# Patient Record
Sex: Female | Born: 1940 | Race: White | Hispanic: No | Smoking: Former smoker
Health system: Southern US, Community
[De-identification: ages and names within clinical notes are randomized; demographics above are authoritative.]

## PROBLEM LIST (undated history)

## (undated) DIAGNOSIS — E039 Hypothyroidism, unspecified: Secondary | ICD-10-CM

## (undated) DIAGNOSIS — K219 Gastro-esophageal reflux disease without esophagitis: Secondary | ICD-10-CM

## (undated) DIAGNOSIS — M1990 Unspecified osteoarthritis, unspecified site: Secondary | ICD-10-CM

## (undated) DIAGNOSIS — A0472 Enterocolitis due to Clostridium difficile, not specified as recurrent: Secondary | ICD-10-CM

## (undated) DIAGNOSIS — M810 Age-related osteoporosis without current pathological fracture: Secondary | ICD-10-CM

## (undated) DIAGNOSIS — K529 Noninfective gastroenteritis and colitis, unspecified: Secondary | ICD-10-CM

## (undated) DIAGNOSIS — J449 Chronic obstructive pulmonary disease, unspecified: Secondary | ICD-10-CM

## (undated) DIAGNOSIS — K5100 Ulcerative (chronic) pancolitis without complications: Secondary | ICD-10-CM

## (undated) DIAGNOSIS — N200 Calculus of kidney: Secondary | ICD-10-CM

## (undated) DIAGNOSIS — E042 Nontoxic multinodular goiter: Secondary | ICD-10-CM

## (undated) DIAGNOSIS — I10 Essential (primary) hypertension: Secondary | ICD-10-CM

## (undated) DIAGNOSIS — K5190 Ulcerative colitis, unspecified, without complications: Secondary | ICD-10-CM

## (undated) DIAGNOSIS — E041 Nontoxic single thyroid nodule: Secondary | ICD-10-CM

## (undated) HISTORY — DX: Essential (primary) hypertension: I10

## (undated) HISTORY — PX: OTHER SURGICAL HISTORY: SHX169

## (undated) HISTORY — DX: Chronic obstructive pulmonary disease, unspecified: J44.9

## (undated) HISTORY — PX: JOINT REPLACEMENT: SHX530

## (undated) HISTORY — DX: Hypothyroidism, unspecified: E03.9

## (undated) HISTORY — DX: Calculus of kidney: N20.0

## (undated) HISTORY — PX: ANKLE SURGERY: SHX546

## (undated) HISTORY — PX: ABDOMINAL HYSTERECTOMY: SHX81

## (undated) HISTORY — PX: BACK SURGERY: SHX140

## (undated) HISTORY — DX: Nontoxic multinodular goiter: E04.2

---

## 2004-12-06 ENCOUNTER — Ambulatory Visit: Payer: Self-pay | Admitting: Internal Medicine

## 2005-06-12 ENCOUNTER — Ambulatory Visit: Payer: Self-pay | Admitting: Internal Medicine

## 2005-07-14 ENCOUNTER — Ambulatory Visit (HOSPITAL_COMMUNITY): Admission: RE | Admit: 2005-07-14 | Discharge: 2005-07-15 | Payer: Self-pay | Admitting: Neurological Surgery

## 2005-11-13 ENCOUNTER — Ambulatory Visit: Payer: Self-pay | Admitting: Internal Medicine

## 2005-11-15 ENCOUNTER — Ambulatory Visit: Payer: Self-pay | Admitting: Internal Medicine

## 2006-03-09 ENCOUNTER — Ambulatory Visit: Payer: Self-pay | Admitting: Gastroenterology

## 2007-01-17 ENCOUNTER — Ambulatory Visit: Payer: Self-pay | Admitting: Internal Medicine

## 2008-02-05 ENCOUNTER — Ambulatory Visit: Payer: Self-pay | Admitting: Internal Medicine

## 2008-07-27 ENCOUNTER — Ambulatory Visit: Payer: Self-pay | Admitting: General Practice

## 2008-07-27 ENCOUNTER — Ambulatory Visit: Payer: Self-pay | Admitting: Cardiology

## 2009-02-25 ENCOUNTER — Ambulatory Visit: Payer: Self-pay | Admitting: Internal Medicine

## 2010-04-14 ENCOUNTER — Ambulatory Visit: Payer: Self-pay | Admitting: Internal Medicine

## 2011-06-06 ENCOUNTER — Ambulatory Visit: Payer: Self-pay | Admitting: Internal Medicine

## 2011-08-04 ENCOUNTER — Inpatient Hospital Stay: Payer: Self-pay | Admitting: Family Medicine

## 2011-08-04 LAB — COMPREHENSIVE METABOLIC PANEL
Albumin: 3.6 g/dL (ref 3.4–5.0)
Alkaline Phosphatase: 71 U/L (ref 50–136)
Bilirubin,Total: 0.6 mg/dL (ref 0.2–1.0)
Calcium, Total: 9 mg/dL (ref 8.5–10.1)
Chloride: 103 mmol/L (ref 98–107)
Co2: 26 mmol/L (ref 21–32)
Glucose: 123 mg/dL — ABNORMAL HIGH (ref 65–99)
Sodium: 140 mmol/L (ref 136–145)
Total Protein: 7.1 g/dL (ref 6.4–8.2)

## 2011-08-04 LAB — URINALYSIS, COMPLETE
Glucose,UR: NEGATIVE mg/dL (ref 0–75)
Ketone: NEGATIVE
Nitrite: NEGATIVE
Ph: 5 (ref 4.5–8.0)
Specific Gravity: 1.024 (ref 1.003–1.030)

## 2011-08-04 LAB — CBC WITH DIFFERENTIAL/PLATELET
Eosinophil #: 0 10*3/uL (ref 0.0–0.7)
Eosinophil %: 0.2 %
HCT: 44 % (ref 35.0–47.0)
Lymphocyte #: 1.1 10*3/uL (ref 1.0–3.6)
MCH: 29.8 pg (ref 26.0–34.0)
MCHC: 32.6 g/dL (ref 32.0–36.0)
MCV: 91 fL (ref 80–100)
Monocyte #: 0.5 x10 3/mm (ref 0.2–0.9)
Neutrophil #: 10.3 10*3/uL — ABNORMAL HIGH (ref 1.4–6.5)
Neutrophil %: 85.9 %
Platelet: 175 10*3/uL (ref 150–440)
RBC: 4.82 10*6/uL (ref 3.80–5.20)
RDW: 13.7 % (ref 11.5–14.5)
WBC: 11.9 10*3/uL — ABNORMAL HIGH (ref 3.6–11.0)

## 2011-08-05 LAB — BASIC METABOLIC PANEL
Anion Gap: 10 (ref 7–16)
BUN: 23 mg/dL — ABNORMAL HIGH (ref 7–18)
Calcium, Total: 8.4 mg/dL — ABNORMAL LOW (ref 8.5–10.1)
Co2: 25 mmol/L (ref 21–32)
Glucose: 110 mg/dL — ABNORMAL HIGH (ref 65–99)

## 2011-08-05 LAB — CBC WITH DIFFERENTIAL/PLATELET
Basophil #: 0 10*3/uL (ref 0.0–0.1)
MCH: 29.9 pg (ref 26.0–34.0)
MCHC: 32.7 g/dL (ref 32.0–36.0)
MCV: 91 fL (ref 80–100)
Monocyte %: 0.6 %
Neutrophil %: 94.3 %
Platelet: 121 10*3/uL — ABNORMAL LOW (ref 150–440)
RDW: 13.5 % (ref 11.5–14.5)

## 2011-08-08 ENCOUNTER — Ambulatory Visit: Payer: Self-pay | Admitting: Urology

## 2011-08-10 ENCOUNTER — Ambulatory Visit: Payer: Self-pay | Admitting: Urology

## 2011-08-10 LAB — CULTURE, BLOOD (SINGLE)

## 2011-08-16 ENCOUNTER — Encounter: Payer: Self-pay | Admitting: Cardiovascular Disease

## 2011-08-21 ENCOUNTER — Ambulatory Visit (INDEPENDENT_AMBULATORY_CARE_PROVIDER_SITE_OTHER): Payer: Medicare Other | Admitting: Cardiovascular Disease

## 2011-08-21 ENCOUNTER — Encounter: Payer: Self-pay | Admitting: Cardiovascular Disease

## 2011-08-21 VITALS — BP 132/86 | HR 69 | Ht 62.0 in | Wt 118.0 lb

## 2011-08-21 DIAGNOSIS — R0602 Shortness of breath: Secondary | ICD-10-CM

## 2011-08-21 DIAGNOSIS — I4949 Other premature depolarization: Secondary | ICD-10-CM

## 2011-08-21 DIAGNOSIS — I493 Ventricular premature depolarization: Secondary | ICD-10-CM | POA: Insufficient documentation

## 2011-08-21 DIAGNOSIS — R079 Chest pain, unspecified: Secondary | ICD-10-CM

## 2011-08-21 DIAGNOSIS — R0789 Other chest pain: Secondary | ICD-10-CM

## 2011-08-21 NOTE — Progress Notes (Signed)
HPI  This is a 71 year old female who was referred by Dr. Jacqlyn Larsen for evaluation of frequent PVCs noted during a recent lithotripsy. The patient reports no previous cardiac history. She was hospitalized this month at [facility] with COPD/asthma exacerbation as well as ureterolithiasis with obstructive uropathy and urinary tract infection. She was treated successfully and discharged home. She underwent an outpatient lithotripsy by Dr. Jacqlyn Larsen. Since then, she has not had any further discomfort. During her lithotripsy, she was noted to have frequent PVCs. The ECG at that time showed prolonged QT interval and left atrial enlargement. She did have palpitations at that time and was nervous. She denies any previous syncope. She occasionally feels palpitations at home but no prolonged episodes of tachycardia. She complains of dyspnea with minimal activities and occasional chest discomfort which she has always associated with her lung disease. She has not had any recent cardiac evaluation. Her functional capacity is limited due to severe osteoarthritis of the left hip. She's actually contemplating having left hip replacement.  Allergies  Allergen Reactions  . Codeine Nausea Only    "dizzy"  . Ibuprofen Hives and Swelling  . Morphine And Related     "Makes me see bugs."     Current Outpatient Prescriptions on File Prior to Visit  Medication Sig Dispense Refill  . Fluticasone-Salmeterol (ADVAIR) 250-50 MCG/DOSE AEPB Inhale 1 puff into the lungs 2 (two) times daily.      . Ipratropium-Albuterol (COMBIVENT IN) Inhale into the lungs 2 (two) times daily.      . Levothyroxine Sodium (SYNTHROID PO) Take 25 mg by mouth daily.      . ranitidine (ZANTAC) 75 MG tablet Take 75 mg by mouth as needed.         Past Medical History  Diagnosis Date  . Asthma   . Hypothyroidism   . Nephrolithiasis     spontaneously passed  . Multiple thyroid nodules     post excision     Past Surgical History  Procedure Date  .  Abdominal hysterectomy   . Ankle surgery     left fracture  . Back surgery     herniated disk  . Excision of thyroid nodule      Family History  Problem Relation Age of Onset  . Heart disease Mother     died at 69     History   Social History  . Marital Status: Married    Spouse Name: N/A    Number of Children: N/A  . Years of Education: N/A   Occupational History  . Not on file.   Social History Main Topics  . Smoking status: Current Everyday Smoker -- 0.5 packs/day  . Smokeless tobacco: Not on file  . Alcohol Use: No  . Drug Use: No  . Sexually Active:    Other Topics Concern  . Not on file   Social History Narrative  . No narrative on file     ROS Constitutional: Negative for fever, chills, diaphoresis, activity change, appetite change and fatigue.  HENT: Negative for hearing loss, nosebleeds, congestion, sore throat, facial swelling, drooling, trouble swallowing, neck pain, voice change, sinus pressure and tinnitus.  Eyes: Negative for photophobia, pain, discharge and visual disturbance.  Respiratory: Negative for apnea. Positive for cough, chest tightness, shortness of breath and wheezing.  Cardiovascular: Negative for  leg swelling.  Gastrointestinal: Negative for nausea, vomiting, abdominal pain, diarrhea, constipation, blood in stool and abdominal distention.  Genitourinary: Negative for dysuria, urgency, frequency, hematuria and  decreased urine volume.  Musculoskeletal: Negative for myalgias, back pain, joint swelling. Positive for arthralgias and gait problem.  Skin: Negative for color change, pallor, rash and wound.  Neurological: Negative for dizziness, tremors, seizures, syncope, speech difficulty, weakness, light-headedness, numbness and headaches.  Psychiatric/Behavioral: Negative for suicidal ideas, hallucinations, behavioral problems and agitation. The patient is not nervous/anxious.     PHYSICAL EXAM   BP 132/86  Pulse 69  Ht 5' 2"   (1.575 m)  Wt 118 lb (53.524 kg)  BMI 21.58 kg/m2 Constitutional: She is oriented to person, place, and time. She appears well-developed and well-nourished. No distress.  HENT: No nasal discharge.  Head: Normocephalic and atraumatic.  Eyes: Pupils are equal and round. Right eye exhibits no discharge. Left eye exhibits no discharge.  Neck: Normal range of motion. Neck supple. No JVD present. No thyromegaly present.  Cardiovascular: Normal rate, regular rhythm, normal heart sounds. Exam reveals no gallop and no friction rub. No murmur heard.  Pulmonary/Chest: Effort normal and diminished breath sounds. No stridor. No respiratory distress. She has no wheezes. She has no rales. She exhibits no tenderness.  Abdominal: Soft. Bowel sounds are normal. She exhibits no distension. There is no tenderness. There is no rebound and no guarding.  Musculoskeletal: Normal range of motion. She exhibits no edema and no tenderness.  Neurological: She is alert and oriented to person, place, and time. Coordination normal.  Skin: Skin is warm and dry. No rash noted. She is not diaphoretic. No erythema. No pallor.  Psychiatric: She has a normal mood and affect. Her behavior is normal. Judgment and thought content normal.     EKG: Normal sinus rhythm with no significant ST or T wave changes. Normal QT interval no premature beats.   ASSESSMENT AND PLAN

## 2011-08-21 NOTE — Patient Instructions (Signed)
Your physician has requested that you have a lexiscan myoview. For further information please visit [email or web address]. Please follow instruction sheet, as given.  Your physician has requested that you have an echocardiogram. Echocardiography is a painless test that uses sound waves to create images of your heart. It provides your doctor with information about the size and shape of your heart and how well your heart's chambers and valves are working. This procedure takes approximately one hour. There are no restrictions for this procedure.  Follow up after tests.

## 2011-08-21 NOTE — Assessment & Plan Note (Signed)
It is possible that this was due to anxiety and acute illness from other reasons. It seems that this has resolved at this time. Her EKG is now normal. Ischemic and structural heart disease we'll need to be evaluated and that will be done with a stress test and an echocardiogram.

## 2011-08-21 NOTE — Assessment & Plan Note (Signed)
The patient complains of occasional chest tightness with fullness in her throat. This has happened mostly at rest and has been nonexertional. Her risk factors for coronary artery disease include her age and prolonged history of tobacco use. I recommend further evaluation with a pharmacologic nuclear stress test. She's not able to exercise on a treadmill due to severe left hip arthritis.

## 2011-08-21 NOTE — Assessment & Plan Note (Signed)
The patient reports prolonged history of dyspnea with minimal activities. This is likely due to her lung disease. However, angina equivalent will need to be ruled out. Stress test as outlined above. I also recommend an echocardiogram to evaluate diastolic function and pulmonary pressure.

## 2011-08-23 ENCOUNTER — Other Ambulatory Visit: Payer: Self-pay

## 2011-08-23 DIAGNOSIS — R0789 Other chest pain: Secondary | ICD-10-CM

## 2011-08-23 DIAGNOSIS — R0602 Shortness of breath: Secondary | ICD-10-CM

## 2011-08-24 ENCOUNTER — Ambulatory Visit: Payer: Self-pay | Admitting: Urology

## 2011-08-31 ENCOUNTER — Other Ambulatory Visit (INDEPENDENT_AMBULATORY_CARE_PROVIDER_SITE_OTHER): Payer: Medicare Other

## 2011-08-31 ENCOUNTER — Other Ambulatory Visit: Payer: Self-pay

## 2011-08-31 DIAGNOSIS — R0602 Shortness of breath: Secondary | ICD-10-CM

## 2011-08-31 DIAGNOSIS — R0789 Other chest pain: Secondary | ICD-10-CM

## 2011-09-05 ENCOUNTER — Ambulatory Visit: Payer: Self-pay | Admitting: Cardiovascular Disease

## 2011-09-05 DIAGNOSIS — R0602 Shortness of breath: Secondary | ICD-10-CM

## 2011-09-11 ENCOUNTER — Ambulatory Visit (INDEPENDENT_AMBULATORY_CARE_PROVIDER_SITE_OTHER): Payer: Medicare Other | Admitting: Cardiovascular Disease

## 2011-09-11 ENCOUNTER — Encounter: Payer: Self-pay | Admitting: Cardiovascular Disease

## 2011-09-11 DIAGNOSIS — R079 Chest pain, unspecified: Secondary | ICD-10-CM

## 2011-09-11 DIAGNOSIS — R0602 Shortness of breath: Secondary | ICD-10-CM

## 2011-09-11 DIAGNOSIS — Z01810 Encounter for preprocedural cardiovascular examination: Secondary | ICD-10-CM | POA: Insufficient documentation

## 2011-09-11 DIAGNOSIS — I4949 Other premature depolarization: Secondary | ICD-10-CM

## 2011-09-11 DIAGNOSIS — I493 Ventricular premature depolarization: Secondary | ICD-10-CM

## 2011-09-11 NOTE — Assessment & Plan Note (Signed)
There probably was some element of deconditioning. Echocardiogram showed normal LV systolic function with only mild diastolic dysfunction, no significant valvular abnormalities and no evidence of pulmonary hypertension.

## 2011-09-11 NOTE — Assessment & Plan Note (Signed)
The patient will be undergoing left hip replacement in the near future. Given that her cardiac workup has been normal, she would be considered at an overall low risk for cardiovascular complications. I will forward this note to Dr. Rudene Christians.

## 2011-09-11 NOTE — Progress Notes (Signed)
HPI  This is a 71 year old female who is here today for a followup regarding frequent PVCs noted during a recent lithotripsy. She also complained of throat tightness and discomfort at rest as well as exertional dyspnea. She underwent evaluation with a nuclear stress test which showed no evidence of ischemia. She had an echocardiogram done which showed normal LV systolic function without significant valvular abnormalities. Since her last visit, she reports feeling much better. Her throat discomfort has resolved completely after she took  Omeprazole. She has not had any palpitations, dizziness or syncope.  Allergies  Allergen Reactions  . Codeine Nausea Only    "dizzy"  . Ibuprofen Hives and Swelling  . Morphine And Related     "Makes me see bugs."     Current Outpatient Prescriptions on File Prior to Visit  Medication Sig Dispense Refill  . Fluticasone-Salmeterol (ADVAIR) 250-50 MCG/DOSE AEPB Inhale 1 puff into the lungs 2 (two) times daily.      . Ipratropium-Albuterol (COMBIVENT IN) Inhale into the lungs as needed.       . Levothyroxine Sodium (SYNTHROID PO) Take 25 mg by mouth daily.      Marland Kitchen omeprazole (PRILOSEC) 20 MG capsule Take 20 mg by mouth daily.      . traMADol (ULTRAM) 50 MG tablet Take 50 mg by mouth as needed.         Past Medical History  Diagnosis Date  . Asthma   . Hypothyroidism   . Nephrolithiasis     spontaneously passed  . Multiple thyroid nodules     post excision     Past Surgical History  Procedure Date  . Abdominal hysterectomy   . Ankle surgery     left fracture  . Back surgery     herniated disk  . Excision of thyroid nodule      Family History  Problem Relation Age of Onset  . Heart disease Mother     died at 22     History   Social History  . Marital Status: Married    Spouse Name: N/A    Number of Children: N/A  . Years of Education: N/A   Occupational History  . Not on file.   Social History Main Topics  . Smoking  status: Current Everyday Smoker -- 1.0 packs/day    Types: Cigarettes  . Smokeless tobacco: Not on file  . Alcohol Use: Yes     occassionally  . Drug Use: No  . Sexually Active:    Other Topics Concern  . Not on file   Social History Narrative  . No narrative on file     PHYSICAL EXAM   BP 138/78  Pulse 60  Ht 5' 2"  (1.575 m)  Wt 122 lb (55.339 kg)  BMI 22.31 kg/m2  Constitutional: She is oriented to person, place, and time. She appears well-developed and well-nourished. No distress.  HENT: No nasal discharge.  Head: Normocephalic and atraumatic.  Eyes: Pupils are equal and round. Right eye exhibits no discharge. Left eye exhibits no discharge.  Neck: Normal range of motion. Neck supple. No JVD present. No thyromegaly present.  Cardiovascular: Normal rate, regular rhythm, normal heart sounds. Exam reveals no gallop and no friction rub. No murmur heard.  Pulmonary/Chest: Effort normal and breath sounds normal. No stridor. No respiratory distress. She has no wheezes. She has no rales. She exhibits no tenderness.  Abdominal: Soft. Bowel sounds are normal. She exhibits no distension. There is no tenderness. There is  no rebound and no guarding.  Musculoskeletal: Normal range of motion. She exhibits no edema and no tenderness.  Neurological: She is alert and oriented to person, place, and time. Coordination normal.  Skin: Skin is warm and dry. No rash noted. She is not diaphoretic. No erythema. No pallor.  Psychiatric: She has a normal mood and affect. Her behavior is normal. Judgment and thought content normal.     ASSESSMENT AND PLAN

## 2011-09-11 NOTE — Assessment & Plan Note (Signed)
Her throat discomfort has resolved after she took omeprazole. Her symptoms were likely due to GERD. Cardiac nuclear stress test showed no evidence of ischemia. No further ischemic cardiac workup is indicated. Followup as needed.

## 2011-09-11 NOTE — Patient Instructions (Addendum)
Follow up as needed

## 2011-09-11 NOTE — Assessment & Plan Note (Signed)
It is possible that this was due to anxiety and acute illness from other reasons. It seems that this has resolved at this time.  She reports no further symptoms of palpitations.

## 2011-09-22 ENCOUNTER — Other Ambulatory Visit: Payer: Self-pay | Admitting: Cardiovascular Disease

## 2011-09-22 DIAGNOSIS — R0602 Shortness of breath: Secondary | ICD-10-CM

## 2011-09-22 DIAGNOSIS — R0789 Other chest pain: Secondary | ICD-10-CM

## 2011-10-04 ENCOUNTER — Ambulatory Visit: Payer: Self-pay | Admitting: Orthopedic Surgery

## 2011-10-04 LAB — CBC
HCT: 42.5 % (ref 35.0–47.0)
HGB: 14 g/dL (ref 12.0–16.0)
MCH: 30.1 pg (ref 26.0–34.0)
MCHC: 32.9 g/dL (ref 32.0–36.0)
MCV: 92 fL (ref 80–100)
Platelet: 295 10*3/uL (ref 150–440)
RBC: 4.65 10*6/uL (ref 3.80–5.20)
WBC: 6.1 10*3/uL (ref 3.6–11.0)

## 2011-10-04 LAB — BASIC METABOLIC PANEL
Anion Gap: 6 — ABNORMAL LOW (ref 7–16)
Calcium, Total: 9 mg/dL (ref 8.5–10.1)
Creatinine: 0.6 mg/dL (ref 0.60–1.30)
Sodium: 143 mmol/L (ref 136–145)

## 2011-10-17 ENCOUNTER — Inpatient Hospital Stay: Payer: Self-pay | Admitting: Physician Assistant

## 2011-10-18 LAB — BASIC METABOLIC PANEL WITH GFR
Anion Gap: 6 — ABNORMAL LOW (ref 7–16)
BUN: 10 mg/dL (ref 7–18)
Calcium, Total: 7.8 mg/dL — ABNORMAL LOW (ref 8.5–10.1)
Chloride: 104 mmol/L (ref 98–107)
Co2: 28 mmol/L (ref 21–32)
Creatinine: 0.57 mg/dL — ABNORMAL LOW (ref 0.60–1.30)
EGFR (African American): >60
EGFR (Non-African Amer.): >60
Glucose: 107 mg/dL — ABNORMAL HIGH (ref 65–99)
Osmolality: 275 (ref 275–301)
Potassium: 4 mmol/L (ref 3.5–5.1)
Sodium: 138 mmol/L (ref 136–145)

## 2011-10-18 LAB — PLATELET COUNT: Platelet: 232 x10 3/mm 3 (ref 150–440)

## 2011-10-19 LAB — PATHOLOGY REPORT

## 2011-10-20 ENCOUNTER — Encounter: Payer: Self-pay | Admitting: Internal Medicine

## 2011-10-23 ENCOUNTER — Encounter: Payer: Self-pay | Admitting: Internal Medicine

## 2012-01-10 ENCOUNTER — Ambulatory Visit: Payer: Self-pay | Admitting: Urology

## 2012-07-18 ENCOUNTER — Ambulatory Visit: Payer: Self-pay | Admitting: Internal Medicine

## 2013-02-04 ENCOUNTER — Ambulatory Visit: Payer: Self-pay | Admitting: Gastroenterology

## 2013-07-15 ENCOUNTER — Ambulatory Visit: Payer: Self-pay | Admitting: Internal Medicine

## 2013-10-23 ENCOUNTER — Ambulatory Visit: Payer: Self-pay | Admitting: Family Medicine

## 2014-04-06 ENCOUNTER — Inpatient Hospital Stay: Payer: Self-pay | Admitting: Internal Medicine

## 2014-04-06 LAB — COMPREHENSIVE METABOLIC PANEL
ALBUMIN: 3.5 g/dL (ref 3.4–5.0)
ALK PHOS: 93 U/L
Anion Gap: 10 (ref 7–16)
BUN: 15 mg/dL (ref 7–18)
Bilirubin,Total: 0.7 mg/dL (ref 0.2–1.0)
CHLORIDE: 105 mmol/L (ref 98–107)
Calcium, Total: 8.7 mg/dL (ref 8.5–10.1)
Co2: 24 mmol/L (ref 21–32)
Creatinine: 0.74 mg/dL (ref 0.60–1.30)
Glucose: 95 mg/dL (ref 65–99)
Osmolality: 278 (ref 275–301)
POTASSIUM: 3.6 mmol/L (ref 3.5–5.1)
SGOT(AST): 22 U/L (ref 15–37)
SGPT (ALT): 25 U/L
SODIUM: 139 mmol/L (ref 136–145)
TOTAL PROTEIN: 7.4 g/dL (ref 6.4–8.2)

## 2014-04-06 LAB — TROPONIN I

## 2014-04-06 LAB — CBC
HCT: 45.1 % (ref 35.0–47.0)
HGB: 14.6 g/dL (ref 12.0–16.0)
MCH: 29.4 pg (ref 26.0–34.0)
MCHC: 32.3 g/dL (ref 32.0–36.0)
MCV: 91 fL (ref 80–100)
Platelet: 226 10*3/uL (ref 150–440)
RBC: 4.95 10*6/uL (ref 3.80–5.20)
RDW: 13.4 % (ref 11.5–14.5)
WBC: 11.9 10*3/uL — ABNORMAL HIGH (ref 3.6–11.0)

## 2014-04-06 LAB — PRO B NATRIURETIC PEPTIDE: B-Type Natriuretic Peptide: 343 pg/mL — ABNORMAL HIGH (ref 0–125)

## 2014-04-07 LAB — BASIC METABOLIC PANEL
ANION GAP: 7 (ref 7–16)
BUN: 14 mg/dL (ref 7–18)
Calcium, Total: 9.3 mg/dL (ref 8.5–10.1)
Chloride: 105 mmol/L (ref 98–107)
Co2: 26 mmol/L (ref 21–32)
Creatinine: 0.68 mg/dL (ref 0.60–1.30)
Glucose: 158 mg/dL — ABNORMAL HIGH (ref 65–99)
OSMOLALITY: 279 (ref 275–301)
Potassium: 4 mmol/L (ref 3.5–5.1)
Sodium: 138 mmol/L (ref 136–145)

## 2014-04-07 LAB — CBC WITH DIFFERENTIAL/PLATELET
BASOS ABS: 0 10*3/uL (ref 0.0–0.1)
BASOS PCT: 0.1 %
EOS PCT: 0 %
Eosinophil #: 0 10*3/uL (ref 0.0–0.7)
HCT: 42.2 % (ref 35.0–47.0)
HGB: 13.9 g/dL (ref 12.0–16.0)
LYMPHS ABS: 0.5 10*3/uL — AB (ref 1.0–3.6)
Lymphocyte %: 7.7 %
MCH: 29.8 pg (ref 26.0–34.0)
MCHC: 32.8 g/dL (ref 32.0–36.0)
MCV: 91 fL (ref 80–100)
MONO ABS: 0 x10 3/mm — AB (ref 0.2–0.9)
MONOS PCT: 0.7 %
NEUTROS ABS: 6.6 10*3/uL — AB (ref 1.4–6.5)
NEUTROS PCT: 91.5 %
Platelet: 211 10*3/uL (ref 150–440)
RBC: 4.65 10*6/uL (ref 3.80–5.20)
RDW: 13.5 % (ref 11.5–14.5)
WBC: 7.2 10*3/uL (ref 3.6–11.0)

## 2014-04-08 LAB — BASIC METABOLIC PANEL
Anion Gap: 10 (ref 7–16)
BUN: 27 mg/dL — AB (ref 7–18)
CO2: 24 mmol/L (ref 21–32)
CREATININE: 0.89 mg/dL (ref 0.60–1.30)
Calcium, Total: 8.6 mg/dL (ref 8.5–10.1)
Chloride: 106 mmol/L (ref 98–107)
EGFR (Non-African Amer.): >60
Glucose: 207 mg/dL — ABNORMAL HIGH (ref 65–99)
Osmolality: 291 (ref 275–301)
Potassium: 3.6 mmol/L (ref 3.5–5.1)
SODIUM: 140 mmol/L (ref 136–145)

## 2014-04-08 LAB — CBC WITH DIFFERENTIAL/PLATELET
Basophil #: 0 10*3/uL (ref 0.0–0.1)
Basophil %: 0 %
EOS PCT: 0 %
Eosinophil #: 0 10*3/uL (ref 0.0–0.7)
HCT: 39.5 % (ref 35.0–47.0)
HGB: 12.9 g/dL (ref 12.0–16.0)
LYMPHS PCT: 5.7 %
Lymphocyte #: 0.6 10*3/uL — ABNORMAL LOW (ref 1.0–3.6)
MCH: 29.8 pg (ref 26.0–34.0)
MCHC: 32.6 g/dL (ref 32.0–36.0)
MCV: 91 fL (ref 80–100)
MONOS PCT: 1.9 %
Monocyte #: 0.2 x10 3/mm (ref 0.2–0.9)
Neutrophil #: 9.4 10*3/uL — ABNORMAL HIGH (ref 1.4–6.5)
Neutrophil %: 92.4 %
Platelet: 218 10*3/uL (ref 150–440)
RBC: 4.32 10*6/uL (ref 3.80–5.20)
RDW: 13.2 % (ref 11.5–14.5)
WBC: 10.1 10*3/uL (ref 3.6–11.0)

## 2014-04-09 LAB — CBC WITH DIFFERENTIAL/PLATELET
BASOS PCT: 0.2 %
Basophil #: 0 10*3/uL (ref 0.0–0.1)
Eosinophil #: 0 10*3/uL (ref 0.0–0.7)
Eosinophil %: 0.1 %
HCT: 41.9 % (ref 35.0–47.0)
HGB: 13.7 g/dL (ref 12.0–16.0)
Lymphocyte #: 0.6 10*3/uL — ABNORMAL LOW (ref 1.0–3.6)
Lymphocyte %: 6.6 %
MCH: 30.1 pg (ref 26.0–34.0)
MCHC: 32.6 g/dL (ref 32.0–36.0)
MCV: 92 fL (ref 80–100)
Monocyte #: 0.2 x10 3/mm (ref 0.2–0.9)
Monocyte %: 2 %
Neutrophil #: 8.2 10*3/uL — ABNORMAL HIGH (ref 1.4–6.5)
Neutrophil %: 91.1 %
Platelet: 268 10*3/uL (ref 150–440)
RBC: 4.54 10*6/uL (ref 3.80–5.20)
RDW: 13.6 % (ref 11.5–14.5)
WBC: 9 10*3/uL (ref 3.6–11.0)

## 2014-04-09 LAB — BASIC METABOLIC PANEL
Anion Gap: 5 — ABNORMAL LOW (ref 7–16)
BUN: 24 mg/dL — AB (ref 7–18)
CHLORIDE: 109 mmol/L — AB (ref 98–107)
Calcium, Total: 8.4 mg/dL — ABNORMAL LOW (ref 8.5–10.1)
Co2: 26 mmol/L (ref 21–32)
Creatinine: 0.86 mg/dL (ref 0.60–1.30)
EGFR (African American): >60
Glucose: 152 mg/dL — ABNORMAL HIGH (ref 65–99)
Osmolality: 286 (ref 275–301)
Potassium: 3.6 mmol/L (ref 3.5–5.1)
Sodium: 140 mmol/L (ref 136–145)

## 2014-04-09 LAB — EXPECTORATED SPUTUM ASSESSMENT W GRAM STAIN, RFLX TO RESP C

## 2014-04-11 LAB — CULTURE, BLOOD (SINGLE)

## 2014-08-15 NOTE — Discharge Summary (Signed)
PATIENT NAME:  Sheila Peters, CAMPO MR#:  [id number] DATE OF BIRTH:  1941-02-06  DATE OF ADMISSION:  04/06/2014 DATE OF DISCHARGE:  04/09/2014  DIAGNOSIS AT TIME OF DISCHARGE:  1.  Acute on chronic respiratory failure.  2.  Chronic obstructive pulmonary disease exacerbation.  3.  Bronchitis.  4.  Tobacco use disorder.  5.  Hypothyroidism.   CHIEF COMPLAINT: Shortness of breath.   HISTORY OF PRESENT ILLNESS: Sheila Peters is a 74 year old female who presented to the ED complaining of shortness of breath associated with cough with yellow sputum and low-grade fever, chills, and sweating. The patient's symptoms started approximately 3 to 4 weeks prior to admission.   PAST MEDICAL HISTORY: Significant for COPD, previous thyroid surgery x 2, history of gastroesophageal reflux disease, hip replacement, back surgery, and hysterectomy. Please see H and P for full details. The patient smokes approximately 5 to 6 cigarettes a day.   PHYSICAL EXAMINATION:  VITAL SIGNS: Temperature was 100, pulse was 85, respirations 18, blood pressure 125/72, oxygen saturation 98% on oxygen. In the ED on room air, the patient's oxygen saturations were in the 80s.  HEART: S1, S2.  LUNGS: Decreased air entry bilaterally.  ABDOMEN: Soft, nontender.  EXTREMITIES: No edema.  IMAGING: Chest x-ray showed evidence of COPD.  LABORATORY: WBC count of 11.9, hemoglobin 14.6, platelets 226,000. Glucose was 95. Troponins were negative.  HOSPITAL COURSE: The patient was admitted to [facility] and received nebulized bronchodilator therapy as well as IV steroids. She was also started on IV antibiotics with Rocephin as well as azithromycin. During her stay in the hospital, the patient did improve symptomatically. She was advised to quit smoking completely. However, with ambulation, her oxygen saturations did drop and she was advised home oxygen. The patient was continued on her home medications and discharged in stable condition on the  following medications.   DISCHARGE MEDICATIONS: Advair Diskus 250/50, 1 puff b.i.d.; levothyroxine 50 mcg once a day; omeprazole 20 mg once a day; Ventolin HFA 2 puffs q. 4 hours p.r.n.; Spiriva HandiHaler 18 mcg 1 capsule once a day; prednisone taper starting at 40 mg a day for 3 days and decrease by 10 mg every 3 days until gone; cefuroxime 250 mg p.o. b.i.d. for 7 days; and azithromycin 250 mg once a day for 3 days; and oxygen 2 L nasal cannula.   DISCHARGE INSTRUCTIONS: The patient is advised to quit smoking completely and to follow with me, Dr. Ginette Pitman, in 1 to 2 weeks' time.  TOTAL TIME SPENT IN DISCHARGING THE PATIENT: 35 minutes    ____________________________ Tracie Harrier, MD vh:ST D: 04/09/2014 12:37:25 ET T: 04/09/2014 23:33:21 ET JOB#: [id number]  cc: Tracie Harrier, MD, <Dictator> Tracie Harrier MD ELECTRONICALLY SIGNED 04/14/2014 13:43

## 2014-08-15 NOTE — H&P (Signed)
PATIENT NAME:  Sheila Peters, Sheila Peters MR#:  [id number] DATE OF BIRTH:  [birth year]  DATE OF ADMISSION:  04/06/2014  PRIMARY CARE PHYSICIAN: Tracie Harrier, MD   CHIEF COMPLAINT: Shortness of breath.   HISTORY OF PRESENT ILLNESS: This is a 74 year old female who thought she had bronchitis, went to the doctor's office today was sent into the ER for hypoxia. She states that she has been feeling short of breath for the last 3-4 weeks, just any little activity is a big effort with regard to her breathing. Over the weekend, she started coughing up a yellow phlegm and had a lot of mucus and had low-grade fever, chills and sweats. In the ER, she was also found to be hypoxic and hospitalist services were contacted for further evaluation.   PAST MEDICAL HISTORY: COPD, arthritis, hypothyroidism, gastroesophageal reflux disease.   PAST SURGICAL HISTORY: Hip replacement, back surgery, left ankle surgery, hysterectomy, thyroid surgery x 2.   ALLERGIES: AUGMENTIN, CODEINE, ETODOLAC, IBUPROFEN AND LEVAQUIN.   MEDICATIONS: That the patient takes on a daily basis include: Advair Diskus 250/50 one inhalation twice a day. Atrovent HFA 2 puffs every 4 hours as needed for wheezing, shortness of breath. Levothyroxine 50 mcg daily, omeprazole 20 mg daily, Spiriva 1 inhalation daily, Tylenol 500 mg 2 tablets every 6 hours as needed for headache. Ventolin HFA 2 puffs every 4 hours as needed for wheezing, shortness of breath. The patient stated she stopped taking her Spiriva and went back to her Atrovent.   SOCIAL HISTORY: Smokes 5 to 6 cigarettes a day since being in her 58s. Rare alcohol. No drug use. Retired Aeronautical engineer.   FAMILY HISTORY: Father died at 57 of arthritis-related issues, potentially with pain medication. Mother died of congestive heart failure and also had dementia. A sister living with heart failure.   REVIEW OF SYSTEMS: CONSTITUTIONAL: Positive for fever. Positive for chills. Positive for sweats.  Positive for fatigue. No weight loss. No weight gain.  EYES: No blurry vision.  EARS, NOSE, MOUTH AND THROAT: Positive for runny nose. No sore throat. No difficulty swallowing.  CARDIOVASCULAR: No chest pain. No palpitations.  RESPIRATORY: Positive for shortness of breath, cough, thick yellow phlegm. No hemoptysis. Occasional wheeze.  GASTROINTESTINAL: Some diarrhea. No nausea. No vomiting. No abdominal pain. No bright red blood per rectum. No melena.  MUSCULOSKELETAL: Positive for joint pains, especially at the base of her head.  INTEGUMENT: No rashes or eruptions.  NEUROLOGIC: No fainting or blackouts.  PSYCHIATRIC: No anxiety or depression.  ENDOCRINE: Positive for hypothyroidism.  HEMATOLOGIC AND LYMPHATIC: No anemia. No easy bruising or bleeding.   PHYSICAL EXAMINATION:  VITAL SIGNS: Temperature 100, pulse 85, respirations 18, blood pressure 125/72, pulse oximetry 98% on oxygen. As per ER physician, pulse oximetry on room air in the 80s.  GENERAL: No respiratory distress, lots of coughing.  EYES: Conjunctivae and lids normal. Pupils equal, round, and reactive to light. Extraocular muscles intact. No nystagmus.  EARS, NOSE, MOUTH AND THROAT: Tympanic membranes: No erythema. Nasal mucosa: No erythema. Throat: No erythema. No exudate seen. Lips and gums: No lesions.  NECK: No JVD. No bruits. No lymphadenopathy. No thyromegaly. No thyroid nodules palpated.  RESPIRATORY: Decreased breath sounds bilaterally. Poor air entry. No rhonchi, rales or wheeze heard.  CARDIOVASCULAR: S1, S2 normal. No gallops, rubs or murmurs heard. Carotid upstroke 2+ bilaterally. No bruises. Dorsalis pedal pulses 2+ bilaterally. No edema of the lower extremity.  ABDOMEN: Soft, nontender. No organomegaly/splenomegaly. Normoactive bowel sounds. No masses felt.  LYMPHATIC: No  lymph nodes in the neck.  MUSCULOSKELETAL: No clubbing, edema. No cyanosis. On oxygen.  NEUROLOGIC: Cranial nerves II-XII grossly intact. Deep  tendon reflexes 2+, bilateral lower extremities.  PSYCHIATRIC: The patient is oriented to person, place and time.   LABORATORY AND RADIOLOGICAL DATA: Chest x-ray showed COPD. White blood cell count 11.9, H and H 14.6 and 45.1, platelet count of 226,000. Glucose 95, BUN 15, creatinine 0.74, sodium 139, potassium 3.6, chloride 105, CO2 of 24, calcium 8.7. Liver function tests normal range. Troponin negative. EKG normal sinus rhythm, 80 beats per minute, Q waves septally.   ASSESSMENT AND PLAN:  1.  Acute respiratory failure with hypoxia. We will continue to give oxygen supplementation.  2.  Chronic obstructive pulmonary disease exacerbation with decreased breath sounds bilaterally. We will give IV Solu-Medrol, nebulizer treatments. Continue Advair at this point. We will give Rocephin and Zithromax for chronic obstructive pulmonary disease exacerbation.  3.  Hypothyroidism. Continue levothyroxine.  4.  Gastroesophageal reflux disease without esophagitis. We will give Protonix while here.  5.  Tobacco abuse. Smoking cessation counseling done 3 minutes by me. Nicotine patch ordered as needed.   TIME SPENT ON ADMISSION: Fifty-five minutes.    ____________________________ Tana Conch. Leslye Peer, MD rjw:TT D: 04/06/2014 15:41:37 ET T: 04/06/2014 15:56:40 ET JOB#: [id number]  cc: Tana Conch. Leslye Peer, MD, <Dictator> Tracie Harrier, MD Marisue Brooklyn MD ELECTRONICALLY SIGNED 04/15/2014 15:50

## 2014-08-16 NOTE — H&P (Signed)
PATIENT NAME:  Sheila Peters, Sheila Peters MR#:  [id number] DATE OF BIRTH:  27-Mar-1941  DATE OF ADMISSION:  08/04/2011  REFERRING PHYSICIAN: Dr. Renee Ramus.  PRIMARY CARE PHYSICIAN:  Dr. Ginette Pitman.   CHIEF COMPLAINT: "I had pain in my stomach and my back and I've been throwing up".   HISTORY OF PRESENT ILLNESS: The patient is a pleasant 74 year old female with past medical history listed below including asthma, hypothyroidism, tobacco abuse, excision of a thyroid nodule, disk herniation status post surgery, history of nephrolithiasis which has spontaneously passed who presented to the Emergency Department with the above-mentioned chief complaint. The patient states that yesterday she developed severe pain in her abdomen radiating to the right flank which is sharp and crampy in nature coming in spasms. Pain was initially 7 out of 10 in intensity. This was associated with nausea and some vomiting. She came to the ER for further evaluation of her symptoms. CT of the abdomen and pelvis was obtained revealing a proximal right ureteral calculus which was 6.1 mm in size with associated mild to moderate obstructive uropathy and findings reflecting perinephric inflammatory changes were also present She was given some pain medications and her pain has now improved to 5 out of 10 in intensity. Urologist, Dr. Jacqlyn Larsen, was notified and advised the patient to follow-up in the office on Monday and in the meanwhile, have her sent home with a prescription for Flomax, antibiotics, Norco and Zofran. She remained in the ER and continued to have abdominal and flank pain, as well as nausea and vomiting. She also developed shortness of breath with some wheezing and states she has had a cold over the past week. She reports cough productive of clear sputum. She also reports low-grade subjective fevers and also chills. Chest x-ray reveals clear lungs in the ER. Otherwise, she is without specific complaints at this time. Hospitalist services were contacted  for further evaluation.   PAST SURGICAL HISTORY:  1. Excision of a thyroid nodule. 2. Hysterectomy. 3. Left ankle fracture surgery.  4. Back surgery for herniated disk.   PAST MEDICAL HISTORY:    1. Asthma. 2. Hypothyroidism.  3. History of nephrolithiasis, which had spontaneously passed. 4. History of thyroid nodules, status post excision.  5. Tobacco abuse, ongoing.  6. History of disk herniation in the back status post surgery.  7. History of left ankle fracture status post surgery.   ALLERGIES: Codeine and ibuprofen.   HOME MEDICATIONS: 1. Advair Diskus 250/50 one dose inhaled b.i.d.  2. Combivent metered dose inhaler b.i.d.  3. Synthroid 25 mcg daily.   FAMILY HISTORY: Mother deceased at age 44, had dementia, otherwise was relatively healthy. Father deceased who was an alcoholic.   SOCIAL HISTORY: Tobacco ongoing, currently smokes half-pack per day and has been smoking for approximately 40 years. Alcohol none. Illicit drugs none. The patient lives in [place], [place] at home with her husband. She has two daughters.   REVIEW OF SYSTEMS: CONSTITUTIONAL: Reports fevers and chills. Denies weakness or fatigue, had some flank and back pain. Denies any recent changes in weight. HEAD/EYES: Denies headache or blurry vision. ENT: Denies tinnitus, earache, nasal discharge, or sore throat. RESPIRATORY: Reports shortness of breath, cough, and wheezing. Denies hemoptysis. CARDIOVASCULAR: Denies chest pain, heart palpitations, or lower extremity edema. GASTROINTESTINAL: Positive for nausea, vomiting, abdominal and flank pain. Denies diarrhea, constipation, melena, or hematochezia. GENITOURINARY: Denies dysuria or hematuria. ENDOCRINE: Denies heat or cold intolerance. HEME/LYMPH: Denies easy bruising of bleeding. No blood in the urine. INTEGUMENT: Denies rash or  lesions. MUSCULOSKELETAL: Denies joint pain or back pain currently. NEUROLOGIC: Denies headache, numbness, weakness, tingling, or dysarthria.  PSYCHIATRIC: Denies depression or anxiety.   PHYSICAL EXAMINATION:  VITAL SIGNS: Temperature 98.6, pulse 106, blood pressure 112/72, respirations 26, oxygen saturation 86% on room air.    GENERAL: The patient is alert and oriented, in mild respiratory distress and is tachypneic but able to speak in complete sentences.   HEENT: Normocephalic, atraumatic. Pupils are equal, round, and reactive to light and accommodation. Extraocular muscles are intact. Anicteric sclerae. Conjunctivae pink. Hearing intact to voice. Nares without drainage. Oral mucosa moist without lesions.   NECK: Supple with full range of motion. No jugular venous distention, lymphadenopathy, or carotid bruits bilaterally. No thyromegaly or tenderness to palpation over thyroid gland.   CHEST: Increased respiratory effort. The patient is tachypneic, but not using her accessory respiratory muscles to breathe. She has decreased breath sounds bilaterally with hyperresonance to percussion and scant wheezing bilaterally, left greater than right, mostly in upper lobes. No crackles or rales. No rhonchi.   CARDIOVASCULAR: S1, S2 positive, but distant. Regular rate and rhythm. No murmurs, rubs, or gallops. PMI is non-lateralized.   ABDOMEN: Soft, nontender, nondistended. Normoactive bowel sounds. No hepatosplenomegaly or palpable masses. No hernia.   BACK: Mild costovertebral angle tenderness palpation on the right.   EXTREMITIES: No clubbing, cyanosis, or edema. Pedal pulses are palpable bilaterally.   SKIN: No suspicious rashes. Skin turgor is good.   LYMPH: No cervical lymphadenopathy.   NEUROLOGIC: Alert and oriented x3. Cranial nerves 2-12 are grossly intact. No focal deficits.    PSYCHIATRIC: Pleasant female with appropriate affect.   LABORATORY, RADIOLOGICAL AND DIAGNOSTIC DATA: Chest x-ray PA and lateral: No acute cardiopulmonary abnormalities are noted. Urinalysis: Turbid urine with 1+ blood, 12 RBCs, 3+ leukocyte esterase,  negative nitrite, 583 WBCs, trace bacteria. There was WBC clumping present. CT of the abdomen and pelvis without contrast 08/04/2011: Proximal right ureteral calculus 6.1 mm in size with associated mild to moderate obstructive uropathy and findings likely reflecting perinephric inflammatory change is also appreciated. CBC within normal limits except for WBC elevated at 11.9. Lipase 84.   ASSESSMENT AND PLAN: a 74 year old female with history of asthma, hypothyroidism, nephrolithiasis, here with abdominal and flank pain, nausea and vomiting, subjective fevers, noted to have 6 mm ureterolithiasis with mild to moderate obstructive uropathy and perinephric stranding as well as mild leukocytosis and abnormal urinalysis indicative of a urinary tract infection with probable infected stone versus pyelonephritis in addition to shortness of breath and hypoxia with mild wheezing with:  IMPRESSION: 1. Acute hypoxic respiratory failure.  2. Acute asthma exacerbation probably from acute bronchitis.  3. Ureterolithiasis with obstructive uropathy.  4. Infected stone with urinary tract infection versus possible acute pyelonephritis.  5. Leukocytosis likely secondary to pyelonephritis/urinary tract infection and infected stone.  6. Hypothyroidism. 7. Tobacco abuse.   PLAN:   1. We will admit the patient to Med/Surg unit with off-unit tele.  2. Will keep her on supplemental oxygen and titrate to keep her oxygen sats greater than 90%.  3. For acute asthma exacerbation, will start IV steroids and IV antibiotics and keep her on scheduled bronchodilator support with nebulizers and also p.r.n. albuterol metered dose inhaler and also continue Advair.  4. Will manage her bronchitis as below.  5. For her ureterolithiasis with obstructive uropathy, I did get a chance to speak with urologist, Dr. Jacqlyn Larsen, who said that there is currently no indication for urology to see the patient while  she is hospitalized at this point unless  her WBC significantly rises or unless she becomes septic from her stone and he recommends having the patient follow up in the office as an outpatient for an outpatient lithotripsy and in the meanwhile, we will keep the patient on IV fluids, pain control, antiemetics and strain her urine and also keep her on Flomax.  6. Further work-up and management to follow depending on the patient's clinical course.  7. For her urinary tract infection and possible pyelonephritis with infected stone, will obtain a urine culture and start empiric IV antibiotics and monitor clinical response. 8. Leukocytosis, as above, is likely secondary to infected stone and with urinary tract infection and pyelonephritis and will follow her WBC count closely with antibiotics and also followup urine culture.  9. Hypothyroidism. Continue Synthroid, check TSH and adjust accordingly.  10. Tobacco abuse. The patient was strongly counseled on the importance of smoking cessation. Approximately three minutes were spent in doing so. She declined a nicotine patch at this time.  11. Deep vein thrombosis prophylaxis with SCDs and TEDs  12. CODE STATUS: FULL CODE.  The case was discussed with Dr. Jacqlyn Larsen of urology. The plan of care and current management were discussed with the patient and her husband in the ER at great lengths who are currently in agreement with the above.   TIME SPENT ON THIS ADMISSION: Approximately 60 minutes.    ____________________________ Romie Jumper, MD knl:ap D: 08/04/2011 14:17:49 ET T: 08/04/2011 15:28:29 ET JOB#: [id number]  cc: Romie Jumper, MD, <Dictator> Tracie Harrier, MD  Romie Jumper MD ELECTRONICALLY SIGNED 08/15/2011 17:52

## 2014-08-16 NOTE — Discharge Summary (Signed)
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   DISCHARGE DIAGNOSES:  1. 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  2. Nausea, improved.   ATTENDING: Hessie Knows, MD with Faith: On June 25th, the patient underwent left total hip arthroplasty with anterior approach by Dr. Hessie Knows and assistant, Dorthula Matas, PA-C. Implants: Medacta AMIS. Estimated blood loss:  500 mL. Specimen sent: Femoral head. Operative findings included a deformity to the femoral head with severe degenerative joint disease. A Hemovac drain was placed. Complications included a small fracture at the proximal femur fixed with a single braided cable.   PATIENT HISTORY: Sheila Peters is a 74 year old with a 4 to 5-year history of degenerative joint disease of the left hip. The patient states the left hip pain comes and goes and can be anywhere from an 8 to 9 out of 10 pain. The pain is most mostly in the left hip and the groin region. The patient states the pain is has affected her quality-of-life as well as activities of daily living. She is unable to walk long distances. She has pain with rest and with activity.   ALLERGIES AND ADVERSE REACTIONS: Codeine, Levaquin, iodine, Augmentin, and ibuprofen. Ibuprofen causes hives.   PAST MEDICAL HISTORY:  1. Hypothyroidism.  2. Osteoporosis.  3. Asthma/lung disease. 4. Arthritis.   PHYSICAL EXAMINATION: HEART: Regular rate and rhythm. LUNGS: Clear to auscultation bilaterally. LEFT HIP: She is very limited with internal and external rotation as well as flexion. She has pain with motion of the hip.   HOSPITAL COURSE: The patient underwent the aforementioned procedure by Dr. Rudene Christians on June 25th and was transferred to the postanesthesia care  unit and then the orthopedic floor in stable condition. Initially she did have some nausea, but she would go on to tolerate her diet. She had quite a bit of Hemovac drain output. She was treated with TED hose, and AVI boots and Xarelto for deep vein thrombosis prophylaxis. Hemoglobin was 10.7 initially. On postoperative day two, her left hip dressing was changed, and there was no reported sign of infection. Her Hemovac drain was pulled. She has had some significant pain, and so Ultram was added to her pain regimen. Originally, she had a morphine pump but this was stopped. Unfortunately, she made very slow progress with physical therapy and was taking very few steps. She did pass some stool on the 28th.   CONDITION AT DISCHARGE: Stable.   DISPOSITION: [facility].   DISCHARGE MEDICATIONS:   1. Xarelto 10 mg, take one per oral every day, stop in 30 days from June 28th. 2. Tramadol 50 to 100 mg every 6 hours as needed for pain.  3. Advair Diskus 250/50 inhaler, 1 puff inhalation b.i.d.   4. Combivent Respimat CFC Free 1 puff inhalation q.i.d.  5. Prilosec 20 mg per oral at bedtime. 6. Synthroid 0.025 mg per oral every morning.  7. Antacid DS suspension 30 mL every 6 hours as needed for heartburn.  8. Dulcolax 10 mg rectal daily as needed for constipation.  9. Milk of magnesia 30 mL oral b.i.d.  as needed for constipation.  10. Ambien 5 mg oral at bedtime as needed for insomnia.  11. Oxycodone 5 to 10 mg every 4 hours as needed for pain.  12. Senokot-S 1  tablet oral b.i.d.  13. Tylenol 500 to 1000 mg every 6 hours as needed for pain or fever.   NOTE: Of note, the patient may take her home calcium supplement if desired.   DISCHARGE INSTRUCTIONS AND FOLLOWUP:   1. Regular diet.  2. Physical Therapy and Occupational Therapy consult. She is weight-bearing as tolerated on her left leg.  3. Universal skin precautions.  4. Soapsuds enema as needed.  5. While awake encourage incentive spirometry,  coughing and deep breathing.  6. She is to wear TED hose knee-high daily for six weeks status post surgery.  7. Nasal cannula oxygen 1 liter currently. She may be weaned off of this.  8. While she is at [facility], a nicotine patch can be added to her medication regimen if desired.   ____________________________ Jerrel Ivory. Harles Evetts, [place] jrp:cbb D: 10/20/2011 12:28:08 ET T: 10/20/2011 12:53:04 ET JOB#: [id number]  cc: Jerrel Ivory. Charlett Nose, [place], <Dictator> [place] PA ELECTRONICALLY SIGNED 10/29/2011 16:39

## 2014-08-16 NOTE — Consult Note (Signed)
Pt discussed with ER and Primary MDproximal stone with partial obstructionindication for emergent stent placementlikely need ESWLfor COPD exacerbation.fluids, Flomax and pain meds are all that are neded at present.as discussed to schedule intervention.develops pain that can't be controlled, high fever, or very high WBC, stent placement would then be indicated.Urology if these changes occur  Electronic Signatures: Murrell Redden (MD)  (Signed on 12-Apr-13 14:30)  Authored  Last Updated: 12-Apr-13 14:30 by Murrell Redden (MD)

## 2014-08-16 NOTE — Op Note (Signed)
PATIENT NAME:  Sheila Peters, Sheila Peters MR#:  [id number] DATE OF BIRTH:  Feb 06, 1941  DATE OF PROCEDURE:  10/17/2011  PREOPERATIVE DIAGNOSIS: Severe left hip osteoarthritis.   POSTOPERATIVE DIAGNOSIS: Severe left hip osteoarthritis.   PROCEDURE: Left total hip replacement.   SURGEON: Laurene Footman, MD   ASSISTANT: Dorthula Matas, PA-C   ANESTHESIA: General.   DESCRIPTION OF PROCEDURE: The patient was brought to the operating room and after adequate anesthesia was obtained the patient was placed on the operative table with the Medacta attachment. The left foot was in the traction boot and preop x-ray obtained. After getting good intraoperative pictures at the start of the case of the hip for subsequent measurement, hip was prepped and draped in the usual sterile fashion. After patient identification and time-out procedures were carried out, the anterior approach was carried out centered over the greater trochanter. Hemostasis was achieved with electrocautery. The tensor fascia was incised and the tensor retracted laterally. The deep fascia was then incised and rectus fascia incised and retracted medially. Cautery was carried out to minimize bleeding. At this point, the anterior capsule was exposed and an anterior capsulotomy carried out developing flaps, retractors placed and a femoral neck cut made at the appropriate level with the x-ray aiding in this localization. The head was removed and was almost square-shaped. It was very deformed with no cartilage remaining. The labrum was excised and the hip reamed at 50 mm. This gave good bleeding bone and the 50 mm trial fit well. 50 mm cup was then impacted in place. Next, the leg was brought into external rotation and slowly extended with release of a portion of the capsule posteriorly to allow for mobilization of the proximal femur. The box osteotome was used initially followed by starter broach, 0 broach, and #1 broach. #1 broach seemed to fit very well and  a trial was placed off of this with determining that this was the appropriate size. After reducing it and trying the long neck, there was a small crack at the anterior aspect of the femoral neck. This was fixed with a single Synthes 1.7 mm cable and then the #1 stem was impacted and the cable was then crimped after the stem was in the appropriate position with a very snug fit with no rotational instability. The head was assembled with a medium 28 mm head and a 50 mm DM liner. These were assembled on the back table and then placed. The hip was then reduced. Intraoperative x-ray showed restoration of some of the length loss from her arthritis. Hip appeared stable. The wound was thoroughly irrigated and then closed with running quill suture for the tensor muscle with 30 mL of 0.5% Sensorcaine with epinephrine infiltrated around the incision. A subcutaneous Hemovac drain was placed followed by 2-0 quill subcutaneously and skin staples. Xeroform, 4 x 4's, ABD, and tape applied. The patient was sent to the recovery room in stable condition.   ESTIMATED BLOOD LOSS: 500.   COMPLICATIONS: Neck fracture treated with Dall-Miles cable.   IMPLANTS: Medacta Versa fit cup DM 50 mm with a 50 mm Versa fit cup DM acetabular shell, a standard 1 Amis femoral stem, and medium 28 mm femoral head.   SPECIMEN REMOVED: Femoral head.   CONDITION: To recovery room stable.   ____________________________ Laurene Footman, MD mjm:drc D: 10/17/2011 10:00:13 ET T: 10/17/2011 10:51:55 ET JOB#: [id number]  cc: Laurene Footman, MD, <Dictator> Laurene Footman MD ELECTRONICALLY SIGNED 10/17/2011 12:50

## 2014-08-16 NOTE — Discharge Summary (Signed)
PATIENT NAME:  Sheila Peters, Sheila Peters MR#:  [id number] DATE OF BIRTH:  [birth year]  DATE OF ADMISSION:  08/04/2011 DATE OF DISCHARGE:  08/05/2011  DISCHARGE DIAGNOSES:  1. Asthma exacerbation.  2. Nephrolithiasis.  3. Hypothyroidism.   DISCHARGE MEDICATIONS:  1. Tramadol 50 mg p.o. q.6 hours as needed for pain.  2. Combivent as directed.  3. Synthroid 25 mcg p.o. daily.  4. Advair 250/50 one puff b.i.d.  5. Prednisone taper as directed.  6. Doxy 100 mg p.o. b.i.d. x10 days.  7. Flomax 0.4 mg p.o. daily for nephrolithiasis.   CONSULTS: None.   PERTINENT LABS ON DAY OF DISCHARGE: Sodium 138, potassium 3.8, BUN 23, creatinine 1.16, glucose 110, calcium 8.4, white blood cell count 12.2, hemoglobin 12.3, platelets 121. Urinalysis showed 1+ blood, 3+ leukocyte esterase. Micro showed 12 RBCs and 583 WBCs. Blood cultures negative thus far.   CT of the abdomen and pelvis showed 6.1 mm calculus in the proximal right ureter.  Chest x-ray at time of admission showed no acute processes. No pneumonia present.   BRIEF HOSPITAL COURSE:   1. Acute exacerbation of asthma. The patient was admitted because she had hypoxia noted in the ER and was having wheezing and respiratory distress consistent with an acute asthma exacerbation. She was placed on the medical floor where she was started on Solu-Medrol, azithromycin, ceftriaxone, and was placed on O2 and breathing treatments. Over the next few hours her clinical status improved. She is no longer wheezing. Her oxygen saturations came up. She wanted to return home for care. Plan is to continue with Advair and the Combivent and also continue with the prednisone oral and doxy 100 b.i.d. x10 days.  2. Nephrolithiasis. She has a 6 mm calculus in the right ureter seen on CT. Currently her pain is controlled. She will be continued on the tramadol. She has an allergy to codeine per patient. Offered to give her Percocet and she declined at this time. Will continue with  Flomax and aggressive hydration. If pain continues, she is instructed to follow-up with Dr. Jacqlyn Larsen this week for possible lithotripsy if stone does not improve. Her urinalysis did show blood and white blood cell count consistent with nephrolithiasis; otherwise, will hold off on more aggressive antibiotics. Will continue with the doxy. She did get a dose of ceftriaxone during her hospital stay.   DISPOSITION: She is in stable condition and is being discharged to home.   FOLLOW-UP:  1. Follow-up with Dr. Jacqlyn Larsen this week if pain persists. 2. Follow-up with Dr. Ginette Pitman within two weeks.   ____________________________ Dion Body, MD kl:drc D: 08/05/2011 09:09:29 ET T: 08/07/2011 12:22:00 ET JOB#: [id number]  cc: Dion Body, MD, <Dictator> Dion Body MD ELECTRONICALLY SIGNED 08/25/2011 12:02

## 2014-11-02 ENCOUNTER — Encounter
Admission: RE | Disposition: A | Discharge status: Home / Self Care | Payer: Self-pay | Source: Ambulatory Visit | Attending: Unknown Physician Specialty

## 2014-11-02 ENCOUNTER — Ambulatory Visit: Payer: Medicare Other | Admitting: Anesthesiology

## 2014-11-02 ENCOUNTER — Ambulatory Visit
Admission: RE | Admit: 2014-11-02 | Discharge: 2014-11-02 | Disposition: A | Discharge status: Home / Self Care | Payer: Medicare Other | Source: Ambulatory Visit | Attending: Unknown Physician Specialty | Admitting: Unknown Physician Specialty

## 2014-11-02 DIAGNOSIS — Z79899 Other long term (current) drug therapy: Secondary | ICD-10-CM | POA: Diagnosis not present

## 2014-11-02 DIAGNOSIS — J45909 Unspecified asthma, uncomplicated: Secondary | ICD-10-CM | POA: Diagnosis not present

## 2014-11-02 DIAGNOSIS — Z7952 Long term (current) use of systemic steroids: Secondary | ICD-10-CM | POA: Insufficient documentation

## 2014-11-02 DIAGNOSIS — Z7951 Long term (current) use of inhaled steroids: Secondary | ICD-10-CM | POA: Insufficient documentation

## 2014-11-02 DIAGNOSIS — J449 Chronic obstructive pulmonary disease, unspecified: Secondary | ICD-10-CM | POA: Diagnosis not present

## 2014-11-02 DIAGNOSIS — K219 Gastro-esophageal reflux disease without esophagitis: Secondary | ICD-10-CM | POA: Diagnosis not present

## 2014-11-02 DIAGNOSIS — E890 Postprocedural hypothyroidism: Secondary | ICD-10-CM | POA: Diagnosis not present

## 2014-11-02 DIAGNOSIS — K529 Noninfective gastroenteritis and colitis, unspecified: Secondary | ICD-10-CM | POA: Diagnosis not present

## 2014-11-02 DIAGNOSIS — Z87891 Personal history of nicotine dependence: Secondary | ICD-10-CM | POA: Diagnosis not present

## 2014-11-02 DIAGNOSIS — K921 Melena: Secondary | ICD-10-CM | POA: Diagnosis not present

## 2014-11-02 DIAGNOSIS — M1610 Unilateral primary osteoarthritis, unspecified hip: Secondary | ICD-10-CM | POA: Insufficient documentation

## 2014-11-02 DIAGNOSIS — E785 Hyperlipidemia, unspecified: Secondary | ICD-10-CM | POA: Diagnosis not present

## 2014-11-02 DIAGNOSIS — K5190 Ulcerative colitis, unspecified, without complications: Secondary | ICD-10-CM | POA: Insufficient documentation

## 2014-11-02 HISTORY — DX: Gastro-esophageal reflux disease without esophagitis: K21.9

## 2014-11-02 HISTORY — DX: Chronic obstructive pulmonary disease, unspecified: J44.9

## 2014-11-02 HISTORY — PX: COLONOSCOPY WITH PROPOFOL: SHX5780

## 2014-11-02 HISTORY — DX: Unspecified osteoarthritis, unspecified site: M19.90

## 2014-11-02 SURGERY — COLONOSCOPY WITH PROPOFOL
Anesthesia: General

## 2014-11-02 MED ORDER — PROPOFOL 10 MG/ML IV BOLUS
INTRAVENOUS | Status: DC | PRN
  Administered 2014-11-02: 55 mg via INTRAVENOUS
  Administered 2014-11-02: 22 mg via INTRAVENOUS

## 2014-11-02 MED ORDER — PROPOFOL INFUSION 10 MG/ML OPTIME
INTRAVENOUS | Status: DC | PRN
  Administered 2014-11-02: 120 ug/kg/min via INTRAVENOUS

## 2014-11-02 MED ORDER — FENTANYL CITRATE (PF) 100 MCG/2ML IJ SOLN
INTRAMUSCULAR | Status: DC | PRN
Start: 2014-11-02 — End: 2014-11-02
  Administered 2014-11-02: 50 ug via INTRAVENOUS

## 2014-11-02 MED ORDER — MIDAZOLAM HCL 2 MG/2ML IJ SOLN
INTRAMUSCULAR | Status: DC | PRN
  Administered 2014-11-02: 1 mg via INTRAVENOUS

## 2014-11-02 MED ORDER — SODIUM CHLORIDE 0.9 % IV SOLN
INTRAVENOUS | Status: DC
  Administered 2014-11-02: 1000 mL via INTRAVENOUS

## 2014-11-02 MED ORDER — SODIUM CHLORIDE 0.9 % IV SOLN: INTRAVENOUS | Status: DC

## 2014-11-02 NOTE — H&P (Signed)
**Note Sheila-Identified via Obfuscation** Primary Care Physician:  Tracie Harrier, MD  Pre-Procedure History & Physical: HPI:  Sheila Peters is a 74 y.o. female is here for an colonoscopy.   Past Medical History  Diagnosis Date  . Asthma   . Hypothyroidism   . Nephrolithiasis     spontaneously passed  . Multiple thyroid nodules     post excision  . COPD (chronic obstructive pulmonary disease)   . Arthritis   . GERD (gastroesophageal reflux disease)     Past Surgical History  Procedure Laterality Date  . Abdominal hysterectomy    . Ankle surgery      left fracture  . Back surgery      herniated disk  . Excision of thyroid nodule    . Joint replacement      Prior to Admission medications   Medication Sig Start Date End Date Taking? Authorizing Provider  acetaminophen (TYLENOL) 500 MG tablet Take 500 mg by mouth every 6 (six) hours as needed.   Yes Historical Provider, MD  ALBUTEROL SULFATE IN Inhale 90 mcg into the lungs 4 (four) times daily as needed (inhale 2 inhalations into the lungs up to four times a day as needed).   Yes Historical Provider, MD  calcium carbonate (TUMS EX) 750 MG chewable tablet Chew 1 tablet by mouth daily.   Yes Historical Provider, MD  Fluticasone-Salmeterol (ADVAIR) 250-50 MCG/DOSE AEPB Inhale 1 puff into the lungs 2 (two) times daily.   Yes Historical Provider, MD  ipratropium (ATROVENT HFA) 17 MCG/ACT inhaler Inhale 2 puffs into the lungs every 6 (six) hours.   Yes Historical Provider, MD  metroNIDAZOLE (FLAGYL) 500 MG tablet Take 500 mg by mouth 3 (three) times daily.   Yes Historical Provider, MD  predniSONE (DELTASONE) 10 MG tablet Take 10 mg by mouth daily with breakfast.   Yes Historical Provider, MD  tiotropium (SPIRIVA) 18 MCG inhalation capsule Place 18 mcg into inhaler and inhale daily.   Yes Historical Provider, MD  cyclobenzaprine (FLEXERIL) 5 MG tablet Take 5 mg by mouth 2 (two) times daily as needed.    Historical Provider, MD  Ipratropium-Albuterol (COMBIVENT IN) Inhale  into the lungs as needed.     Historical Provider, MD  Levothyroxine Sodium (SYNTHROID PO) Take 50 mg by mouth daily.     Historical Provider, MD  omeprazole (PRILOSEC) 20 MG capsule Take 20 mg by mouth daily.    Historical Provider, MD  traMADol (ULTRAM) 50 MG tablet Take 50 mg by mouth as needed. 07/10/11   Historical Provider, MD    Allergies as of 10/19/2014 - Review Complete 09/11/2011  Allergen Reaction Noted  . Codeine Nausea Only 08/21/2011  . Ibuprofen Hives and Swelling 08/21/2011  . Morphine and related  08/21/2011    Family History  Problem Relation Age of Onset  . Heart disease Mother     died at 42    History   Social History  . Marital Status: Married    Spouse Name: N/A  . Number of Children: N/A  . Years of Education: N/A   Occupational History  . Not on file.   Social History Main Topics  . Smoking status: Former Smoker -- 1.00 packs/day    Types: Cigarettes  . Smokeless tobacco: Not on file  . Alcohol Use: Yes     Comment: occassionally  . Drug Use: No  . Sexual Activity: Not on file   Other Topics Concern  . Not on file   Social History Narrative  Physical Exam: BP 125/78 mmHg  Pulse 69  Temp(Src) 98.6 F (37 C) (Tympanic)  Resp 16  Ht 5' 2"  (1.575 m)  Wt 54.432 kg (120 lb)  BMI 21.94 kg/m2  SpO2 95% General:   Alert,  pleasant and cooperative in NAD Head:  Normocephalic and atraumatic. Neck:  Supple; no masses or thyromegaly. Lungs:  Clear throughout to auscultation.    Heart:  Regular rate and rhythm. Abdomen:  Soft, nontender and nondistended. Normal bowel sounds, without guarding, and without rebound.   Neurologic:  Alert and  oriented x4;  grossly normal neurologically.  Impression/Plan: Sheila Peters is here for an colonoscopy to be performed for diarrhea, rectal bleeding  Risks, benefits, limitations, and alternatives regarding  colonoscopy have been reviewed with the patient.  Questions have been answered.  All  parties agreeable.   Josefine Class, MD  11/02/2014, 10:23 AM

## 2014-11-02 NOTE — Anesthesia Postprocedure Evaluation (Signed)
  Anesthesia Post-op Note  Patient: Sheila Peters  Procedure(s) Performed: Procedure(s): COLONOSCOPY WITH PROPOFOL (N/A)  Anesthesia type:General  Patient location: PACU  Post pain: Pain level controlled  Post assessment: Post-op Vital signs reviewed, Patient's Cardiovascular Status Stable, Respiratory Function Stable, Patent Airway and No signs of Nausea or vomiting  Post vital signs: Reviewed and stable  Last Vitals:  Filed Vitals:   11/02/14 1102  BP: 86/50  Pulse: 64  Temp: 35.9 C  Resp: 20    Level of consciousness: awake, alert  and patient cooperative  Complications: No apparent anesthesia complications

## 2014-11-02 NOTE — Anesthesia Preprocedure Evaluation (Addendum)
Anesthesia Evaluation  Patient identified by MRN, date of birth, ID band Patient awake    Reviewed: Allergy & Precautions, NPO status , Patient's Chart, lab work & pertinent test results  Airway Mallampati: I  TM Distance: >3 FB Neck ROM: Full    Dental  (+) Teeth Intact   Pulmonary shortness of breath, asthma , COPD COPD inhaler, former smoker (quit x 8 months),          Cardiovascular     Neuro/Psych    GI/Hepatic GERD-  Medicated and Controlled,  Endo/Other  Hypothyroidism   Renal/GU Renal disease (Stones)     Musculoskeletal   Abdominal   Peds  Hematology   Anesthesia Other Findings   Reproductive/Obstetrics                           Anesthesia Physical Anesthesia Plan  ASA: III  Anesthesia Plan: General   Post-op Pain Management:    Induction: Intravenous  Airway Management Planned: Nasal Cannula  Additional Equipment:   Intra-op Plan:   Post-operative Plan:   Informed Consent: I have reviewed the patients History and Physical, chart, labs and discussed the procedure including the risks, benefits and alternatives for the proposed anesthesia with the patient or authorized representative who has indicated his/her understanding and acceptance.     Plan Discussed with:   Anesthesia Plan Comments:         Anesthesia Quick Evaluation

## 2014-11-02 NOTE — Op Note (Signed)
[facility] Patient Name: Sheila Peters Procedure Date: 11/02/2014 10:28 AM MRN: [id number] Account #: [id number] Date of Birth: 29-Jul-1940 Admit Type: Outpatient Age: 74 Room: [facility] ENDO ROOM 4 Gender: Female Note Status: Finalized Procedure:         Colonoscopy Indications:       Chronic diarrhea, Hematochezia Patient Profile:   This is a 74 year old female. Providers:         Gerrit Heck. Rayann Heman, MD Referring MD:      Tracie Harrier, MD (Referring MD) Medicines:         Propofol per Anesthesia Complications:     No immediate complications. Procedure:         Pre-Anesthesia Assessment:                    - Prior to the procedure, a History and Physical was                     performed, and patient medications, allergies and                     sensitivities were reviewed. The patient's tolerance of                     previous anesthesia was reviewed.                    After obtaining informed consent, the colonoscope was                     passed under direct vision. Throughout the procedure, the                     patient's blood pressure, pulse, and oxygen saturations                     were monitored continuously. The Olympus PCF-H180AL                     colonoscope ( S#: [id number] ) was introduced through the                     anus and advanced to the the terminal ileum. The                     colonoscopy was performed without difficulty. The patient                     tolerated the procedure well. The quality of the bowel                     preparation was good. Findings:      The perianal and digital rectal examinations were normal.      A 3 mm polyp was found in the cecum. The polyp was sessile. The polyp       was removed with a jumbo cold forceps. Resection and retrieval were       complete.      A few ulcers were found in the cecum. Biopsies were taken with a cold       forceps for histology.      Inflammation  characterized by erosions, erythema and aphthous       ulcerations was found in a continuous and circumferential pattern from       the anus to the  descending colon( up to 40 cm). The transverse colon,       the hepatic flexure and the ascending colon were spared. This was       moderate in severity.      Multiple biopsies were obtained with cold forceps for histology randomly       in the proximal transverse colon, in the ascending colon and in the       cecum.      Multiple biopsies were obtained with cold forceps for histology randomly       in the sigmoid colon and in the descending colon.      Four biopsies were obtained with cold forceps for histology randomly in       the rectum.      Four biopsies were obtained with cold forceps for histology randomly in       the terminal ileum. Impression:        - One 3 mm polyp in the cecum. Resected and retrieved.                    - A few ulcers in the cecum. Biopsied.                    - Inflammation was found from the anus to the descending                     colon ( up to 40 cm) secondary to ulcerative colitis.                    - R colon spared except few ulces in cecum.                    - Multiple biopsies were obtained in the proximal                     transverse colon, in the ascending colon and in the cecum.                    - Multiple biopsies were obtained in the sigmoid colon and                     in the descending colon.                    - Four biopsies were obtained in the rectum.                    - Four biopsies were obtained terminal ileum. Recommendation:    - Observe patient in GI recovery unit.                    - Continue present medications.                    - Await pathology results.                    - Return to GI clinic.                    - This is likely left sided UC. Consider uceris or ASA.                    - The findings and recommendations were discussed with the  patient.                    - The findings and recommendations were discussed with the                     patient's family. Procedure Code(s): --- Professional ---                    [phone], Colonoscopy, flexible; with biopsy, single or                     multiple CPT copyright 2014 [employer]. All rights reserved. The codes documented in this report are preliminary and upon coder review may  be revised to meet current compliance requirements. Mellody Life, MD 11/02/2014 11:00:22 AM This report has been signed electronically. Number of Addenda: 0 Note Initiated On: 11/02/2014 10:28 AM Scope Withdrawal Time: 0 hours 17 minutes 47 seconds  Total Procedure Duration: 0 hours 22 minutes 26 seconds       [facility]

## 2014-11-02 NOTE — Transfer of Care (Signed)
Immediate Anesthesia Transfer of Care Note  Patient: Sheila Peters  Procedure(s) Performed: Procedure(s): COLONOSCOPY WITH PROPOFOL (N/A)  Patient Location: PACU  Anesthesia Type:General  Level of Consciousness: sedated  Airway & Oxygen Therapy: Patient Spontanous Breathing  Post-op Assessment: Report given to RN  Post vital signs: Reviewed  Last Vitals:  Filed Vitals:   11/02/14 1102  BP: 86/50  Pulse: 64  Temp: 35.9 C  Resp: 20    Complications: No apparent anesthesia complications

## 2014-11-02 NOTE — Discharge Instructions (Signed)

## 2014-11-03 ENCOUNTER — Encounter: Payer: Self-pay | Admitting: Gastroenterology

## 2014-11-04 ENCOUNTER — Other Ambulatory Visit: Payer: Self-pay | Admitting: Family Medicine

## 2014-11-04 DIAGNOSIS — Z87891 Personal history of nicotine dependence: Secondary | ICD-10-CM | POA: Insufficient documentation

## 2014-11-04 LAB — SURGICAL PATHOLOGY

## 2014-11-10 ENCOUNTER — Other Ambulatory Visit: Payer: Self-pay | Admitting: Family Medicine

## 2014-11-10 DIAGNOSIS — Z87891 Personal history of nicotine dependence: Secondary | ICD-10-CM

## 2014-11-16 ENCOUNTER — Encounter: Payer: Self-pay | Admitting: *Deleted

## 2014-11-16 ENCOUNTER — Other Ambulatory Visit: Payer: Self-pay | Admitting: Family Medicine

## 2014-11-16 DIAGNOSIS — Z87891 Personal history of nicotine dependence: Secondary | ICD-10-CM

## 2014-11-16 NOTE — Progress Notes (Signed)
Notified patient that annual lung cancer screening low dose CT scan is due. Confirmed that patient is within the age range of 55-77, and asymptomatic, (no signs or symptoms of lung cancer). The patient is a current smoker, with a 46 pack year history.

## 2014-11-18 ENCOUNTER — Ambulatory Visit
Admission: RE | Admit: 2014-11-18 | Discharge: 2014-11-18 | Disposition: A | Discharge status: Home / Self Care | Payer: Medicare Other | Source: Ambulatory Visit | Attending: Family Medicine | Admitting: Family Medicine

## 2014-11-18 ENCOUNTER — Ambulatory Visit: Admission: RE | Admit: 2014-11-18 | Payer: Medicare Other | Source: Ambulatory Visit

## 2014-11-18 ENCOUNTER — Inpatient Hospital Stay: Admission: RE | Admit: 2014-11-18 | Payer: Medicare Other | Source: Ambulatory Visit

## 2014-11-18 ENCOUNTER — Ambulatory Visit: Payer: Medicare Other

## 2014-11-18 ENCOUNTER — Telehealth: Payer: Self-pay | Admitting: *Deleted

## 2014-11-18 DIAGNOSIS — Z87891 Personal history of nicotine dependence: Secondary | ICD-10-CM | POA: Diagnosis present

## 2014-11-18 DIAGNOSIS — R911 Solitary pulmonary nodule: Secondary | ICD-10-CM | POA: Insufficient documentation

## 2014-11-18 DIAGNOSIS — R918 Other nonspecific abnormal finding of lung field: Secondary | ICD-10-CM | POA: Diagnosis not present

## 2014-11-18 NOTE — Telephone Encounter (Signed)
  Oncology Nurse Navigator Documentation    Navigator Encounter Type: Telephone;Screening (11/18/14 1300)               Notified patient of LDCT lung cancer screening results of Lung Rads 3  finding with recommendation for 6 month follow up imaging. Also notified of incidental findings noted below. Will contact patient in 6 months to arrange for follow up imaging. Patient verbalizes understanding.   5.5 mm subpleural right middle lobe nodule, difficult to discretely measure due to adjacent interstitial changes, but favored to be mildly increased. Lung-Rads category 3, probably benign findings. Short-term follow-up in 6 months is recommended with repeat low-dose chest CT without contrast (please use the following order, "CT CHEST LUNG CA SCREEN LOW DOSE W/O CM").  Moderate to severe centrilobular and paraseptal emphysematous changes, upper lobe predominant.  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

## 2015-07-13 ENCOUNTER — Other Ambulatory Visit
Admission: RE | Admit: 2015-07-13 | Discharge: 2015-07-13 | Disposition: A | Discharge status: Home / Self Care | Payer: Medicare Other | Source: Ambulatory Visit | Attending: Physician Assistant | Admitting: Physician Assistant

## 2015-07-13 DIAGNOSIS — K5190 Ulcerative colitis, unspecified, without complications: Secondary | ICD-10-CM | POA: Diagnosis present

## 2015-07-13 LAB — GASTROINTESTINAL PANEL BY PCR, STOOL (REPLACES STOOL CULTURE)
ASTROVIRUS: NOT DETECTED
Adenovirus F40/41: NOT DETECTED
CAMPYLOBACTER SPECIES: NOT DETECTED
Cryptosporidium: NOT DETECTED
Cyclospora cayetanensis: NOT DETECTED
E. COLI O157: NOT DETECTED
ENTEROTOXIGENIC E COLI (ETEC): NOT DETECTED
Entamoeba histolytica: NOT DETECTED
Enteroaggregative E coli (EAEC): NOT DETECTED
Enteropathogenic E coli (EPEC): NOT DETECTED
Giardia lamblia: NOT DETECTED
NOROVIRUS GI/GII: NOT DETECTED
Plesimonas shigelloides: NOT DETECTED
ROTAVIRUS A: NOT DETECTED
SAPOVIRUS (I, II, IV, AND V): NOT DETECTED
SHIGA LIKE TOXIN PRODUCING E COLI (STEC): NOT DETECTED
Salmonella species: NOT DETECTED
Shigella/Enteroinvasive E coli (EIEC): NOT DETECTED
VIBRIO CHOLERAE: NOT DETECTED
Vibrio species: NOT DETECTED
Yersinia enterocolitica: NOT DETECTED

## 2015-07-13 LAB — C DIFFICILE QUICK SCREEN W PCR REFLEX
C DIFFICILE (CDIFF) TOXIN: NEGATIVE
C DIFFICLE (CDIFF) ANTIGEN: POSITIVE — AB

## 2015-07-13 LAB — CLOSTRIDIUM DIFFICILE BY PCR: Toxigenic C. Difficile by PCR: POSITIVE — AB

## 2015-07-26 ENCOUNTER — Telehealth: Payer: Self-pay | Admitting: *Deleted

## 2015-07-26 NOTE — Telephone Encounter (Signed)
Notified patient that it is time to have lung cancer screening scan done. Verified patient's age, no signs of lung cancer, no illness that would prevent patient from receiving treatment for lung cancer, and smoking history (former, quiet 2015, 46 pack year history). Patient is expecting call from scheduling with appointment for screening scan and knows to call me with any questions.Original Shared Decision Making Visit was 10/23/13.

## 2015-09-23 ENCOUNTER — Other Ambulatory Visit: Payer: Self-pay | Admitting: Family Medicine

## 2015-09-23 DIAGNOSIS — Z87891 Personal history of nicotine dependence: Secondary | ICD-10-CM

## 2015-10-01 ENCOUNTER — Ambulatory Visit
Admission: RE | Admit: 2015-10-01 | Discharge: 2015-10-01 | Disposition: A | Discharge status: Home / Self Care | Payer: Medicare Other | Source: Ambulatory Visit | Attending: Family Medicine | Admitting: Family Medicine

## 2015-10-01 DIAGNOSIS — J432 Centrilobular emphysema: Secondary | ICD-10-CM | POA: Insufficient documentation

## 2015-10-01 DIAGNOSIS — R918 Other nonspecific abnormal finding of lung field: Secondary | ICD-10-CM | POA: Insufficient documentation

## 2015-10-01 DIAGNOSIS — Z720 Tobacco use: Secondary | ICD-10-CM | POA: Insufficient documentation

## 2015-10-01 DIAGNOSIS — J90 Pleural effusion, not elsewhere classified: Secondary | ICD-10-CM | POA: Insufficient documentation

## 2015-10-01 DIAGNOSIS — Z87891 Personal history of nicotine dependence: Secondary | ICD-10-CM | POA: Diagnosis present

## 2015-10-07 ENCOUNTER — Telehealth: Payer: Self-pay | Admitting: *Deleted

## 2015-10-07 NOTE — Telephone Encounter (Signed)
Notified patient of LDCT lung cancer screening results with recommendation for 2 month follow up imaging. Also notified of incidental finding noted below. Patient verbalizes understanding. Will forward to PCP and patient is to contact PCP for followup.  IMPRESSION: 1. New prominent patchy consolidation and ground-glass opacity with air bronchograms in the basilar left lower lobe. New irregular focus of consolidation in the peripheral right upper lobe. Multilobar pneumonia is suspected. 2. Lung-RADS Category 0S, incomplete. Short-term follow-up is recommended in 2 months with repeat low-dose chest CT without contrast (please use the following order, "CT CHEST LCS NODULE FOLLOW-UP W/O CM"). 3. The "S" modifier above refers to potentially clinically significant non lung cancer related findings. Specifically, 1 vessel coronary atherosclerosis. 4. Trace layering left pleural effusion. 5. Moderate centrilobular emphysema and diffuse bronchial wall thickening, suggesting COPD.

## 2015-10-29 ENCOUNTER — Encounter: Payer: Self-pay | Admitting: Internal Medicine

## 2015-10-29 ENCOUNTER — Ambulatory Visit (INDEPENDENT_AMBULATORY_CARE_PROVIDER_SITE_OTHER): Payer: Medicare Other | Admitting: Internal Medicine

## 2015-10-29 ENCOUNTER — Encounter (INDEPENDENT_AMBULATORY_CARE_PROVIDER_SITE_OTHER): Payer: Self-pay

## 2015-10-29 VITALS — BP 132/80 | HR 66 | Ht 62.0 in | Wt 121.0 lb

## 2015-10-29 DIAGNOSIS — J449 Chronic obstructive pulmonary disease, unspecified: Secondary | ICD-10-CM | POA: Diagnosis not present

## 2015-10-29 DIAGNOSIS — R918 Other nonspecific abnormal finding of lung field: Secondary | ICD-10-CM | POA: Diagnosis not present

## 2015-10-29 DIAGNOSIS — J189 Pneumonia, unspecified organism: Secondary | ICD-10-CM | POA: Diagnosis not present

## 2015-10-29 NOTE — Progress Notes (Signed)
[facility] Consultation      Date: 10/29/2015,   MRN# [id number] ROSENE PILLING Jan 05, 1941 Code Status:  Code Status History    This patient does not have a recorded code status. Please follow your organizational policy for patients in this situation.     Hosp day:@LENGTHOFSTAYDAYS @ Referring MD: @ATDPROV @     PCP:      AdmissionWeight: 121 lb (54.885 kg)                 CurrentWeight: 121 lb (54.885 kg) Charie C Rouser is a 75 y.o. old female seen in consultation for abnormal Ct chest at the request of Dr. Ginette Pitman     CHIEF COMPLAINT:   Abnormal Ct chest   HISTORY OF PRESENT ILLNESS  75 yo white female seen today for abnormal CT chest, patient has h/o COPD Patient has had previous low dose lung cancer screening CT last year shows 5 MM RML nodule Patient has follow up CT chest in July 2017 which showed new LLL opacity and RUL nodular opacity and the previous 5 MM nodule has decreased in size  These findings suggest pneumonia pattern as I reviewed CT scan with patient, howevere, she has no acute symptoms at this time Denies fevers, chills, night sweats, NVD. No cough no wheezing and no SOB, but has DOE that seems to be at baseline  Patient has h/o UC and was treated with steroids and ABX 6 months ago and subsequently developed C diff colitis She is very hesitant to start any abx at this time  No signs of infection at this time   Patient is on inhaled streroids for a long time, and tolerating well at this time     PAST MEDICAL HISTORY   Past Medical History  Diagnosis Date  . Asthma   . Hypothyroidism   . Nephrolithiasis     spontaneously passed  . Multiple thyroid nodules     post excision  . COPD (chronic obstructive pulmonary disease) ([place])   . Arthritis   . GERD (gastroesophageal reflux disease)      SURGICAL HISTORY   Past Surgical History  Procedure Laterality Date  . Abdominal hysterectomy    . Ankle surgery      left  fracture  . Back surgery      herniated disk  . Excision of thyroid nodule    . Joint replacement    . Colonoscopy with propofol N/A 11/02/2014    Procedure: COLONOSCOPY WITH PROPOFOL;  Surgeon: Josefine Class, MD;  Location: [facility] ENDOSCOPY;  Service: Endoscopy;  Laterality: N/A;     FAMILY HISTORY   Family History  Problem Relation Age of Onset  . Heart disease Mother     died at 67     SOCIAL HISTORY   Social History  Substance Use Topics  . Smoking status: Former Smoker -- 1.00 packs/day    Types: Cigarettes  . Smokeless tobacco: None  . Alcohol Use: Yes     Comment: occassionally     MEDICATIONS    Home Medication:  Current Outpatient Rx  Name  Route  Sig  Dispense  Refill  . acetaminophen (TYLENOL) 500 MG tablet   Oral   Take 500 mg by mouth every 6 (six) hours as needed.         . balsalazide (COLAZAL) 750 MG capsule   Oral   Take 3 capsules by mouth 3 (three) times daily.         Marland Kitchen  calcium carbonate (TUMS EX) 750 MG chewable tablet   Oral   Chew 1 tablet by mouth daily.         . Fluticasone-Salmeterol (ADVAIR) 250-50 MCG/DOSE AEPB   Inhalation   Inhale 1 puff into the lungs 2 (two) times daily.         Marland Kitchen ipratropium (ATROVENT HFA) 17 MCG/ACT inhaler   Inhalation   Inhale 2 puffs into the lungs every 6 (six) hours.         . Ipratropium-Albuterol (COMBIVENT IN)   Inhalation   Inhale into the lungs as needed.          . Levothyroxine Sodium (SYNTHROID PO)   Oral   Take 50 mg by mouth daily.          . predniSONE (DELTASONE) 10 MG tablet   Oral   Take 10 mg by mouth daily with breakfast.         . Probiotic Product (PROBIOTIC ADVANCED PO)   Oral   Take 1 capsule by mouth daily.           Current Medication:  Current outpatient prescriptions:  .  acetaminophen (TYLENOL) 500 MG tablet, Take 500 mg by mouth every 6 (six) hours as needed., Disp: , Rfl:  .  balsalazide (COLAZAL) 750 MG capsule, Take 3 capsules by  mouth 3 (three) times daily., Disp: , Rfl:  .  calcium carbonate (TUMS EX) 750 MG chewable tablet, Chew 1 tablet by mouth daily., Disp: , Rfl:  .  Fluticasone-Salmeterol (ADVAIR) 250-50 MCG/DOSE AEPB, Inhale 1 puff into the lungs 2 (two) times daily., Disp: , Rfl:  .  ipratropium (ATROVENT HFA) 17 MCG/ACT inhaler, Inhale 2 puffs into the lungs every 6 (six) hours., Disp: , Rfl:  .  Ipratropium-Albuterol (COMBIVENT IN), Inhale into the lungs as needed. , Disp: , Rfl:  .  Levothyroxine Sodium (SYNTHROID PO), Take 50 mg by mouth daily. , Disp: , Rfl:  .  predniSONE (DELTASONE) 10 MG tablet, Take 10 mg by mouth daily with breakfast., Disp: , Rfl:  .  Probiotic Product (PROBIOTIC ADVANCED PO), Take 1 capsule by mouth daily., Disp: , Rfl:     ALLERGIES   Augmentin; Codeine; Ibuprofen; Levaquin; and Morphine and related     REVIEW OF SYSTEMS   Review of Systems  Constitutional: Negative for fever, chills, weight loss, malaise/fatigue and diaphoresis.  HENT: Negative for congestion and hearing loss.   Eyes: Negative for blurred vision and double vision.  Respiratory: Negative for cough, hemoptysis, sputum production, shortness of breath and wheezing.   Cardiovascular: Negative for chest pain, palpitations and orthopnea.  Gastrointestinal: Negative for heartburn, nausea, vomiting, abdominal pain, diarrhea, constipation and blood in stool.  Genitourinary: Negative for dysuria and urgency.  Musculoskeletal: Negative for myalgias, back pain and neck pain.  Skin: Negative for rash.  Neurological: Negative for dizziness, tingling, tremors, weakness and headaches.  Endo/Heme/Allergies: Does not bruise/bleed easily.  Psychiatric/Behavioral: Negative for depression, suicidal ideas and substance abuse.  All other systems reviewed and are negative.    VS: BP 132/80 mmHg  Pulse 66  Ht 5' 2"  (1.575 m)  Wt 121 lb (54.885 kg)  BMI 22.13 kg/m2  SpO2 95%     PHYSICAL EXAM  Physical Exam    Constitutional: She is oriented to person, place, and time. She appears well-developed and well-nourished. No distress.  HENT:  Head: Normocephalic and atraumatic.  Mouth/Throat: No oropharyngeal exudate.  Eyes: EOM are normal. Pupils are equal, round, and reactive  to light. No scleral icterus.  Neck: Normal range of motion. Neck supple.  Cardiovascular: Normal rate, regular rhythm and normal heart sounds.   No murmur heard. Pulmonary/Chest: No stridor. No respiratory distress. She has no wheezes.  Abdominal: Soft. Bowel sounds are normal.  Musculoskeletal: Normal range of motion. She exhibits no edema.  Neurological: She is alert and oriented to person, place, and time. No cranial nerve deficit.  Skin: Skin is warm. She is not diaphoretic.  Psychiatric: She has a normal mood and affect.             IMAGING    Ct Chest Lung Ca Screen Low Dose W/o Cm  10/01/2015  CLINICAL DATA:  75 year old female former smoker with 46 pack-year smoking history, quit 2 years prior. EXAM: CT CHEST WITHOUT CONTRAST LOW-DOSE FOR LUNG CANCER SCREENING TECHNIQUE: Multidetector CT imaging of the chest was performed following the standard protocol without IV contrast. COMPARISON:  11/18/2014 screening chest CT. FINDINGS: Mediastinum/Nodes: Normal heart size. No pericardial fluid/thickening. Left anterior descending coronary atherosclerosis. Atherosclerotic nonaneurysmal thoracic aorta. Normal caliber pulmonary arteries. Stable atrophic appearing thyroid gland. Normal visualized thyroid. Normal esophagus. No pathologically enlarged axillary, mediastinal or gross hilar lymph nodes, noting limited sensitivity for the detection of hilar adenopathy on this noncontrast study. Lungs/Pleura: No pneumothorax. Trace layering left pleural effusion. No right pleural effusion. Moderate centrilobular emphysema and diffuse bronchial wall thickening. There is new patchy consolidation and ground-glass opacity with air  bronchograms in the basilar left lower lobe. There is a new irregular focus of consolidation in the peripheral right upper lobe measuring 3.8 x 3.2 cm (series 3/ image 130). Previously described peripheral right middle lobe pulmonary nodule measures 5.0 mm in volume derived mean diameter (series 3/ image 228), decreased in size. Upper abdomen: Unremarkable. Musculoskeletal: No aggressive appearing focal osseous lesions. Moderate degenerative changes in the thoracic spine. IMPRESSION: 1. New prominent patchy consolidation and ground-glass opacity with air bronchograms in the basilar left lower lobe. New irregular focus of consolidation in the peripheral right upper lobe. Multilobar pneumonia is suspected. 2. Lung-RADS Category 0S, incomplete. Short-term follow-up is recommended in 2 months with repeat low-dose chest CT without contrast (please use the following order, "CT CHEST LCS NODULE FOLLOW-UP W/O CM"). 3. The "S" modifier above refers to potentially clinically significant non lung cancer related findings. Specifically, 1 vessel coronary atherosclerosis. 4. Trace layering left pleural effusion. 5. Moderate centrilobular emphysema and diffuse bronchial wall thickening, suggesting COPD. These results will be called to the ordering clinician or representative by the Radiologist Assistant, and communication documented in the PACS or zVision Dashboard. Electronically Signed   By: Ilona Sorrel M.D.   On: 10/01/2015 15:01   Images reviewed 10/29/2015 with patient  CT chest July 2016 RML nodule approx 5 mm  Agree with findings of CT chest June 2017 RML decreased in size from 5 mm. RUL nodular opacity LLL opacity with air bronchogram signs    ASSESSMENT/PLAN   75 yo white female former smoker with h/o COPD gold stage A with abnormal CT chest with stable/decreased RML 5 MM nodule with new fingings of RUL nodular opacity and Left lower lobe opacity  At this time, patient does NOT want any abx and does NOT  want any procedures at this time, she does not have any symptoms of pneumonia.  I have discussed that we can repeat CT chest in 2-3 months and re-assess plan of action  She agreed to this plan and satisfied with visit  1.will repeat  CT chest on 2-3 months 2.follow up with PFT/6MWT and ONO in 3 months 3.continue advair  4.atrovent as needed  Follow up in 3 months with repeat CT chest  I have personally obtained a history, examined the patient, evaluated laboratory and independently reviewed imaging results, formulated the assessment and plan and placed orders.  The Patient requires high complexity decision making for assessment and support, frequent evaluation and titration of therapies, application of advanced monitoring technologies and extensive interpretation of multiple databases.   Patient  satisfied with Plan of action and management. All questions answered  Corrin Parker, M.D.  [facility]  Medical Director [place] Director [facility] Cardio-Pulmonary Department

## 2015-10-29 NOTE — Patient Instructions (Addendum)
1.repeat CT chest in Sept 2017 2.check PFT and ONO and 6MWT in September 3.continue advair, atrovent as needed  Chronic Obstructive Pulmonary Disease Chronic obstructive pulmonary disease (COPD) is a common lung condition in which airflow from the lungs is limited. COPD is a general term that can be used to describe many different lung problems that limit airflow, including both chronic bronchitis and emphysema. If you have COPD, your lung function will probably never return to normal, but there are measures you can take to improve lung function and make yourself feel better. CAUSES   Smoking (common).  Exposure to secondhand smoke.  Genetic problems.  Chronic inflammatory lung diseases or recurrent infections. SYMPTOMS  Shortness of breath, especially with physical activity.  Deep, persistent (chronic) cough with a large amount of thick mucus.  Wheezing.  Rapid breaths (tachypnea).  Gray or bluish discoloration (cyanosis) of the skin, especially in your fingers, toes, or lips.  Fatigue.  Weight loss.  Frequent infections or episodes when breathing symptoms become much worse (exacerbations).  Chest tightness. DIAGNOSIS Your health care Labella Zahradnik will take a medical history and perform a physical examination to diagnose COPD. Additional tests for COPD may include:  Lung (pulmonary) function tests.  Chest X-ray.  CT scan.  Blood tests. TREATMENT  Treatment for COPD may include:  Inhaler and nebulizer medicines. These help manage the symptoms of COPD and make your breathing more comfortable.  Supplemental oxygen. Supplemental oxygen is only helpful if you have a low oxygen level in your blood.  Exercise and physical activity. These are beneficial for nearly all people with COPD.  Lung surgery or transplant.  Nutrition therapy to gain weight, if you are underweight.  Pulmonary rehabilitation. This may involve working with a team of health care providers and  specialists, such as respiratory, occupational, and physical therapists. HOME CARE INSTRUCTIONS  Take all medicines (inhaled or pills) as directed by your health care Marcellino Fidalgo.  Avoid over-the-counter medicines or cough syrups that dry up your airway (such as antihistamines) and slow down the elimination of secretions unless instructed otherwise by your health care Dedee Liss.  If you are a smoker, the most important thing that you can do is stop smoking. Continuing to smoke will cause further lung damage and breathing trouble. Ask your health care Tanae Petrosky for help with quitting smoking. He or she can direct you to community resources or hospitals that provide support.  Avoid exposure to irritants such as smoke, chemicals, and fumes that aggravate your breathing.  Use oxygen therapy and pulmonary rehabilitation if directed by your health care Laurance Heide. If you require home oxygen therapy, ask your health care Hayato Guaman whether you should purchase a pulse oximeter to measure your oxygen level at home.  Avoid contact with individuals who have a contagious illness.  Avoid extreme temperature and humidity changes.  Eat healthy foods. Eating smaller, more frequent meals and resting before meals may help you maintain your strength.  Stay active, but balance activity with periods of rest. Exercise and physical activity will help you maintain your ability to do things you want to do.  Preventing infection and hospitalization is very important when you have COPD. Make sure to receive all the vaccines your health care Mylez Venable recommends, especially the pneumococcal and influenza vaccines. Ask your health care Marisha Renier whether you need a pneumonia vaccine.  Learn and use relaxation techniques to manage stress.  Learn and use controlled breathing techniques as directed by your health care Falan Hensler. Controlled breathing techniques include:  Pursed lip  breathing. Start by breathing in (inhaling) through  your nose for 1 second. Then, purse your lips as if you were going to whistle and breathe out (exhale) through the pursed lips for 2 seconds.  Diaphragmatic breathing. Start by putting one hand on your abdomen just above your waist. Inhale slowly through your nose. The hand on your abdomen should move out. Then purse your lips and exhale slowly. You should be able to feel the hand on your abdomen moving in as you exhale.  Learn and use controlled coughing to clear mucus from your lungs. Controlled coughing is a series of short, progressive coughs. The steps of controlled coughing are:  Lean your head slightly forward.  Breathe in deeply using diaphragmatic breathing.  Try to hold your breath for 3 seconds.  Keep your mouth slightly open while coughing twice.  Spit any mucus out into a tissue.  Rest and repeat the steps once or twice as needed. SEEK MEDICAL CARE IF:  You are coughing up more mucus than usual.  There is a change in the color or thickness of your mucus.  Your breathing is more labored than usual.  Your breathing is faster than usual. SEEK IMMEDIATE MEDICAL CARE IF:  You have shortness of breath while you are resting.  You have shortness of breath that prevents you from:  Being able to talk.  Performing your usual physical activities.  You have chest pain lasting longer than 5 minutes.  Your skin color is more cyanotic than usual.  You measure low oxygen saturations for longer than 5 minutes with a pulse oximeter. MAKE SURE YOU:  Understand these instructions.  Will watch your condition.  Will get help right away if you are not doing well or get worse.   This information is not intended to replace advice given to you by your health care Tarri Guilfoil. Make sure you discuss any questions you have with your health care Lathaniel Legate.   Document Released: 01/18/2005 Document Revised: 05/01/2014 Document Reviewed: 12/05/2012 Elsevier Interactive Patient Education  2016 [place] Pneumonia, Adult Pneumonia is an infection of the lungs. One type of pneumonia can happen while a person is in a hospital. A different type can happen when a person is not in a hospital (community-acquired pneumonia). It is easy for this kind to spread from person to person. It can spread to you if you breathe near an infected person who coughs or sneezes. Some symptoms include:  A dry cough.  A wet (productive) cough.  Fever.  Sweating.  Chest pain. HOME CARE  Take over-the-counter and prescription medicines only as told by your doctor.  Only take cough medicine if you are losing sleep.  If you were prescribed an antibiotic medicine, take it as told by your doctor. Do not stop taking the antibiotic even if you start to feel better.  Sleep with your head and neck raised (elevated). You can do this by putting a few pillows under your head, or you can sleep in a recliner.  Do not use tobacco products. These include cigarettes, chewing tobacco, and e-cigarettes. If you need help quitting, ask your doctor.  Drink enough water to keep your pee (urine) clear or pale yellow. A shot (vaccine) can help prevent pneumonia. Shots are often suggested for:  People older than 73 years of age.  People older than 73 years of age:  Who are having cancer treatment.  Who have long-term (chronic) lung disease.  Who have problems with their body's defense  system (immune system). You may also prevent pneumonia if you take these actions:  Get the flu (influenza) shot every year.  Go to the dentist as often as told.  Wash your hands often. If soap and water are not available, use hand sanitizer. GET HELP IF:  You have a fever.  You lose sleep because your cough medicine does not help. GET HELP RIGHT AWAY IF:  You are short of breath and it gets worse.  You have more chest pain.  Your sickness gets worse. This is very serious if:  You are an  older adult.  Your body's defense system is weak.  You cough up blood.   This information is not intended to replace advice given to you by your health care Tykwon Fera. Make sure you discuss any questions you have with your health care Ernan Runkles.   Document Released: 09/27/2007 Document Revised: 12/30/2014 Document Reviewed: 08/05/2014 Elsevier Interactive Patient Education [employer].

## 2015-11-18 ENCOUNTER — Telehealth: Payer: Self-pay | Admitting: Internal Medicine

## 2015-11-18 DIAGNOSIS — J449 Chronic obstructive pulmonary disease, unspecified: Secondary | ICD-10-CM

## 2015-11-18 NOTE — Telephone Encounter (Signed)
Spoke with Darlina Guys at [facility] and she is going to check on this and will get back with me tomorrow. Rhonda J Cobb

## 2015-11-18 NOTE — Telephone Encounter (Signed)
Pt states [facility] is wanting a credit card on file for her to do the ONO. Pt states she doesn't have one and if she did she wouldn't give them the information. Please advise if there is someone else she can go through to get the ONO done.

## 2015-11-18 NOTE — Telephone Encounter (Signed)
Pt has a question regarding her sleep equipment. States they are requiring her to set up on auto payment before her equipment is shipped. Please call.

## 2015-11-23 NOTE — Telephone Encounter (Signed)
Pt is aware that debit/credit card required before [facility] will provide ONO on RA. Pt states that she does not have a debit/credit card and doesn't understand why she would need to have one on file. I explained to patient that this is [facility] policy so that if the equipment is not returned.  Pt stated that she did not have a card and if she did she would not like to have it on file where someone else would have this information. Pt asked to change DME companies.    Staff message sent to Darlina Guys to D/C order for ONO that patient has transferred to another DME. Rhonda J Cobb

## 2015-11-30 ENCOUNTER — Other Ambulatory Visit: Payer: Self-pay | Admitting: *Deleted

## 2015-11-30 DIAGNOSIS — J449 Chronic obstructive pulmonary disease, unspecified: Secondary | ICD-10-CM

## 2015-12-02 ENCOUNTER — Other Ambulatory Visit
Admission: RE | Admit: 2015-12-02 | Discharge: 2015-12-02 | Disposition: A | Discharge status: Home / Self Care | Payer: Medicare Other | Source: Ambulatory Visit | Attending: Student | Admitting: Student

## 2015-12-02 DIAGNOSIS — R197 Diarrhea, unspecified: Secondary | ICD-10-CM | POA: Diagnosis present

## 2015-12-02 LAB — GASTROINTESTINAL PANEL BY PCR, STOOL (REPLACES STOOL CULTURE)
Adenovirus F40/41: NOT DETECTED
Astrovirus: NOT DETECTED
CRYPTOSPORIDIUM: NOT DETECTED
CYCLOSPORA CAYETANENSIS: NOT DETECTED
Campylobacter species: NOT DETECTED
E. COLI O157: NOT DETECTED
ENTEROAGGREGATIVE E COLI (EAEC): NOT DETECTED
Entamoeba histolytica: NOT DETECTED
Enteropathogenic E coli (EPEC): NOT DETECTED
Enterotoxigenic E coli (ETEC): NOT DETECTED
Giardia lamblia: NOT DETECTED
Norovirus GI/GII: NOT DETECTED
PLESIMONAS SHIGELLOIDES: NOT DETECTED
ROTAVIRUS A: NOT DETECTED
SALMONELLA SPECIES: NOT DETECTED
SAPOVIRUS (I, II, IV, AND V): NOT DETECTED
SHIGA LIKE TOXIN PRODUCING E COLI (STEC): NOT DETECTED
Shigella/Enteroinvasive E coli (EIEC): NOT DETECTED
VIBRIO CHOLERAE: NOT DETECTED
VIBRIO SPECIES: NOT DETECTED
Yersinia enterocolitica: NOT DETECTED

## 2015-12-02 LAB — C DIFFICILE QUICK SCREEN W PCR REFLEX
C Diff antigen: POSITIVE — AB
C Diff toxin: NEGATIVE

## 2015-12-02 LAB — CLOSTRIDIUM DIFFICILE BY PCR: CDIFFPCR: POSITIVE — AB

## 2015-12-12 ENCOUNTER — Other Ambulatory Visit
Admission: RE | Admit: 2015-12-12 | Discharge: 2015-12-12 | Disposition: A | Discharge status: Home / Self Care | Payer: Medicare Other | Source: Ambulatory Visit | Attending: Student | Admitting: Student

## 2015-12-12 DIAGNOSIS — R197 Diarrhea, unspecified: Secondary | ICD-10-CM | POA: Diagnosis present

## 2015-12-12 LAB — GASTROINTESTINAL PANEL BY PCR, STOOL (REPLACES STOOL CULTURE)
Adenovirus F40/41: NOT DETECTED
Astrovirus: NOT DETECTED
CYCLOSPORA CAYETANENSIS: NOT DETECTED
Campylobacter species: NOT DETECTED
Cryptosporidium: NOT DETECTED
E. COLI O157: NOT DETECTED
ENTAMOEBA HISTOLYTICA: NOT DETECTED
ENTEROAGGREGATIVE E COLI (EAEC): NOT DETECTED
ENTEROPATHOGENIC E COLI (EPEC): NOT DETECTED
Enterotoxigenic E coli (ETEC): NOT DETECTED
Giardia lamblia: NOT DETECTED
NOROVIRUS GI/GII: NOT DETECTED
Plesimonas shigelloides: NOT DETECTED
Rotavirus A: NOT DETECTED
SALMONELLA SPECIES: NOT DETECTED
SHIGELLA/ENTEROINVASIVE E COLI (EIEC): NOT DETECTED
Sapovirus (I, II, IV, and V): NOT DETECTED
Shiga like toxin producing E coli (STEC): NOT DETECTED
Vibrio cholerae: NOT DETECTED
Vibrio species: NOT DETECTED
YERSINIA ENTEROCOLITICA: NOT DETECTED

## 2016-01-04 ENCOUNTER — Other Ambulatory Visit: Payer: Self-pay | Admitting: Internal Medicine

## 2016-01-04 DIAGNOSIS — K51919 Ulcerative colitis, unspecified with unspecified complications: Secondary | ICD-10-CM

## 2016-01-04 DIAGNOSIS — R197 Diarrhea, unspecified: Secondary | ICD-10-CM

## 2016-01-05 ENCOUNTER — Other Ambulatory Visit: Payer: Self-pay | Admitting: Student

## 2016-01-05 ENCOUNTER — Other Ambulatory Visit: Payer: Self-pay | Admitting: *Deleted

## 2016-01-05 DIAGNOSIS — K51919 Ulcerative colitis, unspecified with unspecified complications: Secondary | ICD-10-CM

## 2016-01-05 DIAGNOSIS — R197 Diarrhea, unspecified: Secondary | ICD-10-CM

## 2016-01-05 DIAGNOSIS — Z87891 Personal history of nicotine dependence: Secondary | ICD-10-CM

## 2016-01-05 DIAGNOSIS — J189 Pneumonia, unspecified organism: Secondary | ICD-10-CM

## 2016-01-14 ENCOUNTER — Ambulatory Visit (INDEPENDENT_AMBULATORY_CARE_PROVIDER_SITE_OTHER): Payer: Medicare Other | Admitting: *Deleted

## 2016-01-14 ENCOUNTER — Ambulatory Visit
Admission: RE | Admit: 2016-01-14 | Discharge: 2016-01-14 | Disposition: A | Discharge status: Home / Self Care | Payer: Medicare Other | Source: Ambulatory Visit | Attending: Internal Medicine | Admitting: Internal Medicine

## 2016-01-14 DIAGNOSIS — J449 Chronic obstructive pulmonary disease, unspecified: Secondary | ICD-10-CM

## 2016-01-14 DIAGNOSIS — R197 Diarrhea, unspecified: Secondary | ICD-10-CM

## 2016-01-14 DIAGNOSIS — J189 Pneumonia, unspecified organism: Secondary | ICD-10-CM | POA: Diagnosis not present

## 2016-01-14 DIAGNOSIS — K51919 Ulcerative colitis, unspecified with unspecified complications: Secondary | ICD-10-CM

## 2016-01-14 DIAGNOSIS — R918 Other nonspecific abnormal finding of lung field: Secondary | ICD-10-CM | POA: Insufficient documentation

## 2016-01-14 DIAGNOSIS — I700 Atherosclerosis of aorta: Secondary | ICD-10-CM | POA: Insufficient documentation

## 2016-01-14 DIAGNOSIS — Z87891 Personal history of nicotine dependence: Secondary | ICD-10-CM

## 2016-01-14 DIAGNOSIS — J439 Emphysema, unspecified: Secondary | ICD-10-CM | POA: Insufficient documentation

## 2016-01-14 DIAGNOSIS — I2510 Atherosclerotic heart disease of native coronary artery without angina pectoris: Secondary | ICD-10-CM | POA: Insufficient documentation

## 2016-01-14 DIAGNOSIS — K8020 Calculus of gallbladder without cholecystitis without obstruction: Secondary | ICD-10-CM | POA: Insufficient documentation

## 2016-01-14 LAB — PULMONARY FUNCTION TEST
DL/VA % pred: 46 %
DL/VA: 2.12 ml/min/mmHg/L
DLCO UNC % PRED: 49 %
DLCO UNC: 10.74 ml/min/mmHg
FEF 25-75 POST: 0.48 L/s
FEF 25-75 Pre: 0.45 L/sec
FEF2575-%Change-Post: 8 %
FEF2575-%PRED-POST: 30 %
FEF2575-%Pred-Pre: 27 %
FEV1-%Change-Post: 4 %
FEV1-%PRED-PRE: 49 %
FEV1-%Pred-Post: 51 %
FEV1-POST: 1.02 L
FEV1-Pre: 0.97 L
FEV1FVC-%Change-Post: 1 %
FEV1FVC-%PRED-PRE: 65 %
FEV6-%CHANGE-POST: 3 %
FEV6-%PRED-POST: 82 %
FEV6-%Pred-Pre: 79 %
FEV6-Post: 2.06 L
FEV6-Pre: 1.98 L
FEV6FVC-%CHANGE-POST: 0 %
FEV6FVC-%Pred-Post: 105 %
FEV6FVC-%Pred-Pre: 104 %
FVC-%Change-Post: 3 %
FVC-%PRED-POST: 78 %
FVC-%Pred-Pre: 75 %
FVC-Post: 2.06 L
FVC-Pre: 1.99 L
POST FEV1/FVC RATIO: 49 %
PRE FEV6/FVC RATIO: 100 %
Post FEV6/FVC ratio: 100 %
Pre FEV1/FVC ratio: 49 %

## 2016-01-14 MED ORDER — IOPAMIDOL (ISOVUE-300) INJECTION 61%
85.0000 mL | Freq: Once | INTRAVENOUS | Status: AC | PRN
  Administered 2016-01-14: 85 mL via INTRAVENOUS

## 2016-01-14 NOTE — Progress Notes (Signed)
PFT performed today with nitrogen washout. 

## 2016-01-14 NOTE — Progress Notes (Signed)
SMW performed today. 

## 2016-01-18 ENCOUNTER — Encounter: Payer: Self-pay | Admitting: Internal Medicine

## 2016-01-18 ENCOUNTER — Ambulatory Visit (INDEPENDENT_AMBULATORY_CARE_PROVIDER_SITE_OTHER): Payer: Medicare Other | Admitting: Internal Medicine

## 2016-01-18 VITALS — BP 122/76 | HR 76 | Ht 62.0 in | Wt 112.0 lb

## 2016-01-18 DIAGNOSIS — J449 Chronic obstructive pulmonary disease, unspecified: Secondary | ICD-10-CM

## 2016-01-18 DIAGNOSIS — R918 Other nonspecific abnormal finding of lung field: Secondary | ICD-10-CM | POA: Diagnosis not present

## 2016-01-18 MED ORDER — UMECLIDINIUM-VILANTEROL 62.5-25 MCG/INH IN AEPB: 1.0000 | INHALATION_SPRAY | Freq: Every day | RESPIRATORY_TRACT | 0 refills | Status: AC

## 2016-01-18 NOTE — Addendum Note (Signed)
Addended by: Maryanna Shape A on: 01/18/2016 12:36 PM   Modules accepted: Orders

## 2016-01-18 NOTE — Patient Instructions (Signed)
Start ANORO and stop Advair And assess breathing  Follow up CT chest in 6 months  Chronic Obstructive Pulmonary Disease Chronic obstructive pulmonary disease (COPD) is a common lung condition in which airflow from the lungs is limited. COPD is a general term that can be used to describe many different lung problems that limit airflow, including both chronic bronchitis and emphysema. If you have COPD, your lung function will probably never return to normal, but there are measures you can take to improve lung function and make yourself feel better. CAUSES   Smoking (common).  Exposure to secondhand smoke.  Genetic problems.  Chronic inflammatory lung diseases or recurrent infections. SYMPTOMS  Shortness of breath, especially with physical activity.  Deep, persistent (chronic) cough with a large amount of thick mucus.  Wheezing.  Rapid breaths (tachypnea).  Gray or bluish discoloration (cyanosis) of the skin, especially in your fingers, toes, or lips.  Fatigue.  Weight loss.  Frequent infections or episodes when breathing symptoms become much worse (exacerbations).  Chest tightness. DIAGNOSIS Your health care provider will take a medical history and perform a physical examination to diagnose COPD. Additional tests for COPD may include:  Lung (pulmonary) function tests.  Chest X-ray.  CT scan.  Blood tests. TREATMENT  Treatment for COPD may include:  Inhaler and nebulizer medicines. These help manage the symptoms of COPD and make your breathing more comfortable.  Supplemental oxygen. Supplemental oxygen is only helpful if you have a low oxygen level in your blood.  Exercise and physical activity. These are beneficial for nearly all people with COPD.  Lung surgery or transplant.  Nutrition therapy to gain weight, if you are underweight.  Pulmonary rehabilitation. This may involve working with a team of health care providers and specialists, such as respiratory,  occupational, and physical therapists. HOME CARE INSTRUCTIONS  Take all medicines (inhaled or pills) as directed by your health care provider.  Avoid over-the-counter medicines or cough syrups that dry up your airway (such as antihistamines) and slow down the elimination of secretions unless instructed otherwise by your health care provider.  If you are a smoker, the most important thing that you can do is stop smoking. Continuing to smoke will cause further lung damage and breathing trouble. Ask your health care provider for help with quitting smoking. He or she can direct you to community resources or hospitals that provide support.  Avoid exposure to irritants such as smoke, chemicals, and fumes that aggravate your breathing.  Use oxygen therapy and pulmonary rehabilitation if directed by your health care provider. If you require home oxygen therapy, ask your health care provider whether you should purchase a pulse oximeter to measure your oxygen level at home.  Avoid contact with individuals who have a contagious illness.  Avoid extreme temperature and humidity changes.  Eat healthy foods. Eating smaller, more frequent meals and resting before meals may help you maintain your strength.  Stay active, but balance activity with periods of rest. Exercise and physical activity will help you maintain your ability to do things you want to do.  Preventing infection and hospitalization is very important when you have COPD. Make sure to receive all the vaccines your health care provider recommends, especially the pneumococcal and influenza vaccines. Ask your health care provider whether you need a pneumonia vaccine.  Learn and use relaxation techniques to manage stress.  Learn and use controlled breathing techniques as directed by your health care provider. Controlled breathing techniques include:  Pursed lip breathing. Start by  breathing in (inhaling) through your nose for 1 second. Then, purse  your lips as if you were going to whistle and breathe out (exhale) through the pursed lips for 2 seconds.  Diaphragmatic breathing. Start by putting one hand on your abdomen just above your waist. Inhale slowly through your nose. The hand on your abdomen should move out. Then purse your lips and exhale slowly. You should be able to feel the hand on your abdomen moving in as you exhale.  Learn and use controlled coughing to clear mucus from your lungs. Controlled coughing is a series of short, progressive coughs. The steps of controlled coughing are: 1. Lean your head slightly forward. 2. Breathe in deeply using diaphragmatic breathing. 3. Try to hold your breath for 3 seconds. 4. Keep your mouth slightly open while coughing twice. 5. Spit any mucus out into a tissue. 6. Rest and repeat the steps once or twice as needed. SEEK MEDICAL CARE IF:  You are coughing up more mucus than usual.  There is a change in the color or thickness of your mucus.  Your breathing is more labored than usual.  Your breathing is faster than usual. SEEK IMMEDIATE MEDICAL CARE IF:  You have shortness of breath while you are resting.  You have shortness of breath that prevents you from:  Being able to talk.  Performing your usual physical activities.  You have chest pain lasting longer than 5 minutes.  Your skin color is more cyanotic than usual.  You measure low oxygen saturations for longer than 5 minutes with a pulse oximeter. MAKE SURE YOU:  Understand these instructions.  Will watch your condition.  Will get help right away if you are not doing well or get worse.   This information is not intended to replace advice given to you by your health care provider. Make sure you discuss any questions you have with your health care provider.   Document Released: 01/18/2005 Document Revised: 05/01/2014 Document Reviewed: 12/05/2012 Elsevier Interactive Patient Education [employer].

## 2016-01-18 NOTE — Progress Notes (Signed)
Patient ID: Sheila Peters, female   DOB: [birth year], 75 y.o.   MRN: [id number] Patient seen in the office today and instructed on use of ANORO ELLIPTA.  Patient expressed understanding and demonstrated technique.

## 2016-01-18 NOTE — Progress Notes (Signed)
[facility] Consultation      Date: 01/18/2016,   MRN# [id number] Sheila Peters [birth year] Code Status:  Code Status History    This patient does not have a recorded code status. Please follow your organizational policy for patients in this situation.     Hosp day:@LENGTHOFSTAYDAYS @ Referring MD: @ATDPROV @     PCP:      AdmissionWeight: 112 lb (50.8 kg)                 CurrentWeight: 112 lb (50.8 kg) Sheila Peters is a 75 y.o. old female seen in consultation for abnormal Ct chest at the request of Dr. Ginette Pitman     CHIEF COMPLAINT:   Abnormal Ct chest   HISTORY OF PRESENT ILLNESS  75 yo white female for follow seen today for abnormal CT chest, patient has h/o COPD Patient has had previous low dose lung cancer screening CT last year shows 5 MM RML nodule Patient has follow up CT chest in July 2017 which showed new LLL opacity and RUL nodular opacity and the previous 5 MM nodule has decreased in size-current CT chest 01/14/16  shows much improvement of LLL and RUL opacity   she has no acute symptoms at this time Denies fevers, chills, night sweats, NVD. No cough no wheezing and no SOB, but has DOE that seems to be at baseline  Patient has h/o UC and was treated with steroids and ABX 6 months ago and subsequently developed C diff colitis   No signs of infection at this time   Patient is on inhaled streroids for a long time, and tolerating well at this time  PFT's 01/14/16 Fev1 49% Fev1 0.97 L  ratio 49% DLCO 49%  CT chest 01/14/16 RML nodule approx 5MM equivocal growth since last exam Left Lower lobe residual opacity-likely scar tissue PFT's 01/14/16    Current Medication:  Current Outpatient Prescriptions:  .  acetaminophen (TYLENOL) 500 MG tablet, Take 500 mg by mouth every 6 (six) hours as needed., Disp: , Rfl:  .  balsalazide (COLAZAL) 750 MG capsule, Take 3 capsules by mouth 3 (three) times daily., Disp: , Rfl:  .  calcium carbonate  (TUMS EX) 750 MG chewable tablet, Chew 1 tablet by mouth daily., Disp: , Rfl:  .  Fluticasone-Salmeterol (ADVAIR) 250-50 MCG/DOSE AEPB, Inhale 1 puff into the lungs 2 (two) times daily., Disp: , Rfl:  .  ipratropium (ATROVENT HFA) 17 MCG/ACT inhaler, Inhale 2 puffs into the lungs every 6 (six) hours., Disp: , Rfl:  .  Ipratropium-Albuterol (COMBIVENT IN), Inhale into the lungs as needed. , Disp: , Rfl:  .  Levothyroxine Sodium (SYNTHROID PO), Take 50 mg by mouth daily. , Disp: , Rfl:  .  predniSONE (DELTASONE) 10 MG tablet, Take 10 mg by mouth daily with breakfast., Disp: , Rfl:  .  Probiotic Product (PROBIOTIC ADVANCED PO), Take 1 capsule by mouth daily., Disp: , Rfl:     ALLERGIES   Augmentin [amoxicillin-pot clavulanate]; Codeine; Ibuprofen; Levaquin [levofloxacin in d5w]; and Morphine and related     REVIEW OF SYSTEMS   Review of Systems  Constitutional: Negative for chills, diaphoresis, fever, malaise/fatigue and weight loss.  HENT: Negative for congestion.   Respiratory: Positive for shortness of breath. Negative for cough, hemoptysis, sputum production and wheezing.   Cardiovascular: Negative for chest pain, palpitations, orthopnea and leg swelling.  Gastrointestinal: Negative for abdominal pain, heartburn, nausea and vomiting.  Musculoskeletal: Negative for myalgias.  Neurological: Negative for weakness.  All other systems reviewed and are negative.    VS: BP 122/76 (BP Location: Left Arm, Cuff Size: Normal)   Pulse 76   Ht 5' 2"  (1.575 m)   Wt 112 lb (50.8 kg)   SpO2 98%   BMI 20.49 kg/m      PHYSICAL EXAM  Physical Exam  Constitutional: She is oriented to person, place, and time. She appears well-developed and well-nourished. No distress.  HENT:  Mouth/Throat: No oropharyngeal exudate.  Cardiovascular: Normal rate, regular rhythm and normal heart sounds.   No murmur heard. Pulmonary/Chest: Effort normal and breath sounds normal. No stridor. No respiratory  distress. She has no wheezes. She has no rales.  Musculoskeletal: Normal range of motion. She exhibits no edema.  Neurological: She is alert and oriented to person, place, and time. No cranial nerve deficit.  Skin: Skin is warm. She is not diaphoretic.  Psychiatric: She has a normal mood and affect.             IMAGING    Images reviewed 01/18/2016 CT chest 01/14/16 RML nodule approx 5MM equivocal growth since last exam Left Lower lobe residual opacity-likely scar tissue/rounded atelactasis   CT chest June 2017 RML decreased in size from 5 mm. RUL nodular opacity LLL opacity with air bronchogram signs  CT chest July 2016 RML nodule approx 5 mm  ASSESSMENT/PLAN   75 yo white female former smoker with h/o moderate/severe COPD gold stage A with abnormal CT chest with stable/decreased RML 5 MM nodule with resolving RUL, LLL opacity suggestive of pneumonia now with left lower lobe scar/rounded atelectasis   1.will repeat CT chest in 6 months 2.will stop advair and start ANORO and assess resp status 3.albuterol as needed  Follow up in 6 months with repeat CT chest  I have personally obtained a history, examined the patient, evaluated laboratory and independently reviewed imaging results, formulated the assessment and plan and placed orders.  The Patient requires high complexity decision making for assessment and support, frequent evaluation and titration of therapies, application of advanced monitoring technologies and extensive interpretation of multiple databases.   Patient  satisfied with Plan of action and management. All questions answered  Corrin Parker, M.D.  [facility]  Medical Director [place] Director [facility] Cardio-Pulmonary Department

## 2016-01-26 ENCOUNTER — Other Ambulatory Visit
Admission: RE | Admit: 2016-01-26 | Discharge: 2016-01-26 | Disposition: A | Discharge status: Home / Self Care | Payer: Medicare Other | Source: Ambulatory Visit | Attending: Student | Admitting: Student

## 2016-01-26 DIAGNOSIS — R197 Diarrhea, unspecified: Secondary | ICD-10-CM | POA: Insufficient documentation

## 2016-01-26 LAB — GASTROINTESTINAL PANEL BY PCR, STOOL (REPLACES STOOL CULTURE)
Adenovirus F40/41: NOT DETECTED
Astrovirus: NOT DETECTED
CRYPTOSPORIDIUM: NOT DETECTED
Campylobacter species: NOT DETECTED
Cyclospora cayetanensis: NOT DETECTED
E. COLI O157: NOT DETECTED
ENTEROAGGREGATIVE E COLI (EAEC): NOT DETECTED
Entamoeba histolytica: NOT DETECTED
Enteropathogenic E coli (EPEC): NOT DETECTED
Enterotoxigenic E coli (ETEC): NOT DETECTED
GIARDIA LAMBLIA: NOT DETECTED
NOROVIRUS GI/GII: NOT DETECTED
Plesimonas shigelloides: NOT DETECTED
Rotavirus A: NOT DETECTED
SALMONELLA SPECIES: NOT DETECTED
SHIGA LIKE TOXIN PRODUCING E COLI (STEC): NOT DETECTED
SHIGELLA/ENTEROINVASIVE E COLI (EIEC): NOT DETECTED
Sapovirus (I, II, IV, and V): NOT DETECTED
VIBRIO CHOLERAE: NOT DETECTED
Vibrio species: NOT DETECTED
Yersinia enterocolitica: NOT DETECTED

## 2016-01-26 LAB — C DIFFICILE QUICK SCREEN W PCR REFLEX
C Diff antigen: NEGATIVE
C Diff interpretation: NOT DETECTED
C Diff toxin: NEGATIVE

## 2016-03-22 ENCOUNTER — Other Ambulatory Visit: Payer: Self-pay | Admitting: Rheumatology

## 2016-03-22 ENCOUNTER — Encounter: Admission: RE | Payer: Self-pay | Source: Ambulatory Visit

## 2016-03-22 ENCOUNTER — Ambulatory Visit
Admission: RE | Admit: 2016-03-22 | Payer: Medicare Other | Source: Ambulatory Visit | Admitting: Unknown Physician Specialty

## 2016-03-22 DIAGNOSIS — M542 Cervicalgia: Secondary | ICD-10-CM

## 2016-03-22 SURGERY — COLONOSCOPY WITH PROPOFOL
Anesthesia: General

## 2016-04-01 ENCOUNTER — Ambulatory Visit: Payer: Medicare Other

## 2016-06-21 ENCOUNTER — Other Ambulatory Visit: Payer: Self-pay | Admitting: *Deleted

## 2016-06-21 DIAGNOSIS — R918 Other nonspecific abnormal finding of lung field: Secondary | ICD-10-CM

## 2016-08-03 ENCOUNTER — Ambulatory Visit
Admission: RE | Admit: 2016-08-03 | Discharge: 2016-08-03 | Disposition: A | Discharge status: Home / Self Care | Payer: Medicare Other | Source: Ambulatory Visit | Attending: Internal Medicine | Admitting: Internal Medicine

## 2016-08-03 DIAGNOSIS — K449 Diaphragmatic hernia without obstruction or gangrene: Secondary | ICD-10-CM | POA: Insufficient documentation

## 2016-08-03 DIAGNOSIS — J438 Other emphysema: Secondary | ICD-10-CM | POA: Diagnosis not present

## 2016-08-03 DIAGNOSIS — R918 Other nonspecific abnormal finding of lung field: Secondary | ICD-10-CM | POA: Diagnosis present

## 2016-08-03 DIAGNOSIS — I2510 Atherosclerotic heart disease of native coronary artery without angina pectoris: Secondary | ICD-10-CM | POA: Diagnosis not present

## 2016-08-03 DIAGNOSIS — I700 Atherosclerosis of aorta: Secondary | ICD-10-CM | POA: Insufficient documentation

## 2016-08-03 DIAGNOSIS — I7789 Other specified disorders of arteries and arterioles: Secondary | ICD-10-CM | POA: Insufficient documentation

## 2016-08-07 ENCOUNTER — Encounter: Payer: Self-pay | Admitting: Internal Medicine

## 2016-08-07 ENCOUNTER — Ambulatory Visit (INDEPENDENT_AMBULATORY_CARE_PROVIDER_SITE_OTHER): Payer: Medicare Other | Admitting: Internal Medicine

## 2016-08-07 VITALS — BP 130/76 | HR 67 | Wt 123.0 lb

## 2016-08-07 DIAGNOSIS — J438 Other emphysema: Secondary | ICD-10-CM | POA: Diagnosis not present

## 2016-08-07 NOTE — Patient Instructions (Signed)
STOP ADVAIR STOP [place]  If you get rash, RESTART ADVAIR AND ASSESS DAILY IF YOU NEED SPIRIVA

## 2016-08-07 NOTE — Progress Notes (Signed)
[facility] Consultation      Date: 08/07/2016,   MRN# [id number] FADUMA CHO May 04, 1940 Code Status:  Code Status History    This patient does not have a recorded code status. Please follow your organizational policy for patients in this situation.     Hosp day:@LENGTHOFSTAYDAYS @ Referring MD: @ATDPROV @     PCP:      AdmissionWeight: 123 lb (55.8 kg)                 CurrentWeight: 123 lb (55.8 kg) Jonel C Schaafsma is a 76 y.o. old female seen in consultation for abnormal Ct chest at the request of Dr. Ginette Pitman     CHIEF COMPLAINT:   Abnormal Ct chest   HISTORY OF PRESENT ILLNESS  76 yo white female for follow seen today for abnormal CT chest, patient has h/o COPD Patient has had previous low dose lung cancer screening CT last year shows 5 MM RML nodule Patient has follow up CT chest in July 2017 which showed new LLL opacity and RUL nodular opacity and the previous 5 MM nodule has decreased in size-current CT chest 01/14/16  shows much improvement of LLL and RUL opacity  Current CT chest 08/03/16 shows resolution of RML nodule and also stable LLL opacity c/w scarring   she has no acute symptoms at this time Denies fevers, chills, night sweats, NVD. No cough no wheezing and no SOB, but has DOE that seems to be at baseline  Patient has h/o UC and was treated heavy dose of prednisone 4 months ago  No signs of infection at this time  Patient supposedly had a rash and questions associated with [facility] She would like to stop Advair and Spiriva and re-assess ANORO  PFT's 01/14/16 Fev1 49% Fev1 0.97 L  ratio 49% DLCO 49%  CT chest 4/18 RML nodule approx 5MM resolved since last CT Left Lower lobe residual opacity-likely scar tissue-no change in size since last CT chest PFT's 01/14/16    Current Medication:  Current Outpatient Prescriptions:  .  Adalimumab 40 MG/0.8ML PNKT, Inject 40 mg into the skin every 21 ( twenty-one) days. Every 2 weeks, Disp: ,  Rfl:  .  acetaminophen (TYLENOL) 500 MG tablet, Take 500 mg by mouth every 6 (six) hours as needed., Disp: , Rfl:  .  balsalazide (COLAZAL) 750 MG capsule, Take 3 capsules by mouth 3 (three) times daily., Disp: , Rfl:  .  calcium carbonate (TUMS EX) 750 MG chewable tablet, Chew 1 tablet by mouth daily., Disp: , Rfl:  .  Fluticasone-Salmeterol (ADVAIR) 250-50 MCG/DOSE AEPB, Inhale 1 puff into the lungs 2 (two) times daily., Disp: , Rfl:  .  ipratropium (ATROVENT HFA) 17 MCG/ACT inhaler, Inhale 2 puffs into the lungs every 6 (six) hours., Disp: , Rfl:  .  Ipratropium-Albuterol (COMBIVENT IN), Inhale into the lungs as needed. , Disp: , Rfl:  .  Levothyroxine Sodium (SYNTHROID PO), Take 50 mg by mouth daily. , Disp: , Rfl:  .  predniSONE (DELTASONE) 10 MG tablet, Take 10 mg by mouth daily with breakfast., Disp: , Rfl:  .  Probiotic Product (PROBIOTIC ADVANCED PO), Take 1 capsule by mouth daily., Disp: , Rfl:     ALLERGIES   Augmentin [amoxicillin-pot clavulanate]; Codeine; Ibuprofen; Levaquin [levofloxacin in d5w]; and Morphine and related     REVIEW OF SYSTEMS   Review of Systems  Constitutional: Negative for chills, diaphoresis, fever, malaise/fatigue and weight loss.  HENT: Negative for congestion.   Respiratory:  Positive for shortness of breath. Negative for cough, hemoptysis, sputum production and wheezing.   Cardiovascular: Negative for chest pain, palpitations, orthopnea and leg swelling.  Gastrointestinal: Negative for abdominal pain, heartburn, nausea and vomiting.  Musculoskeletal: Negative for myalgias.  Neurological: Negative for weakness.  All other systems reviewed and are negative.    VS: BP 130/76 (BP Location: Left Arm, Cuff Size: Normal)   Pulse 67   Wt 123 lb (55.8 kg)   SpO2 92%   BMI 22.50 kg/m      PHYSICAL EXAM  Physical Exam  Constitutional: She is oriented to person, place, and time. She appears well-developed and well-nourished. No distress.  HENT:    Mouth/Throat: No oropharyngeal exudate.  Cardiovascular: Normal rate, regular rhythm and normal heart sounds.   No murmur heard. Pulmonary/Chest: Effort normal and breath sounds normal. No stridor. No respiratory distress. She has no wheezes. She has no rales.  Musculoskeletal: Normal range of motion. She exhibits no edema.  Neurological: She is alert and oriented to person, place, and time. No cranial nerve deficit.  Skin: Skin is warm. She is not diaphoretic.  Psychiatric: She has a normal mood and affect.             IMAGING    CT chest Images reviewed 08/07/2016 Compared to CT chest 01/14/16 RML nodule approx 5MM resolved  Left Lower lobe residual opacity-likely scar tissue/rounded atelactasis   CT chest June 2017 RML decreased in size from 5 mm. RUL nodular opacity LLL opacity with air bronchogram signs  CT chest July 2016 RML nodule approx 5 mm  ASSESSMENT/PLAN   76 yo white female former smoker with h/o moderate/severe COPD gold stage A with abnormal CT chest with stable/decreased RML 5 MM nodule with resolving  LLL opacity now with left lower lobe scar/rounded atelectasis   1.will repeat CT chest as needed 2.will stop advair and Stop Spiriva and start ANORO and assess resp status 3.albuterol as needed  Follow up in 3 months   Patient  satisfied with Plan of action and management. All questions answered  Corrin Parker, M.D.  [facility]  Medical Director [place] Director [facility] Cardio-Pulmonary Department

## 2016-08-15 ENCOUNTER — Telehealth: Payer: Self-pay | Admitting: Internal Medicine

## 2016-08-15 MED ORDER — UMECLIDINIUM-VILANTEROL 62.5-25 MCG/INH IN AEPB: 1.0000 | INHALATION_SPRAY | Freq: Every day | RESPIRATORY_TRACT | 6 refills | Status: DC

## 2016-08-15 NOTE — Telephone Encounter (Signed)
Pt wanted to let Dr. Mortimer Fries that Elliot Gault did not cause a rash. She would like a rx for this called to [address], she asks if her insurance will pay for this since she just got her Advair refilled not too long ago

## 2016-10-10 ENCOUNTER — Encounter (INDEPENDENT_AMBULATORY_CARE_PROVIDER_SITE_OTHER): Payer: Self-pay | Admitting: Vascular Surgery

## 2016-10-10 ENCOUNTER — Ambulatory Visit (INDEPENDENT_AMBULATORY_CARE_PROVIDER_SITE_OTHER): Payer: Medicare Other | Admitting: Vascular Surgery

## 2016-10-10 VITALS — BP 142/86 | HR 71 | Resp 16 | Ht 62.0 in | Wt 123.0 lb

## 2016-10-10 DIAGNOSIS — M79609 Pain in unspecified limb: Secondary | ICD-10-CM | POA: Insufficient documentation

## 2016-10-10 DIAGNOSIS — M7989 Other specified soft tissue disorders: Secondary | ICD-10-CM | POA: Diagnosis not present

## 2016-10-10 DIAGNOSIS — I83812 Varicose veins of left lower extremities with pain: Secondary | ICD-10-CM | POA: Diagnosis not present

## 2016-10-10 DIAGNOSIS — M79605 Pain in left leg: Secondary | ICD-10-CM | POA: Diagnosis not present

## 2016-10-10 NOTE — Progress Notes (Signed)
Patient ID: Sheila Peters, female   DOB: [birth year], 76 y.o.   MRN: [id number]  Chief Complaint  Patient presents with  . New Patient (Initial Visit)    leg swelling    HPI Sheila Peters is a 76 y.o. female.  Patient present for evaluation of Swelling, discoloration, and warmth in the left lower extremity. This is associated with increasing prominence of varicosities on the left leg. The leg is heavy tires easily and she does have intermittent swelling as well. It is mildly swollen today. This is legs she has had a broken femur, hip replacement, and a major ankle surgery on. She has some scattered varicosities on the right leg but it is not really symptomatic. She denies any fever or chills. She denies any chest pain or shortness of breath. She does not have ulceration or infection. Symptoms have been gradually progressing over many months.  Current Outpatient Prescriptions  Medication Sig Dispense Refill  . acetaminophen (TYLENOL) 500 MG tablet Take 500 mg by mouth every 6 (six) hours as needed.    . Adalimumab 40 MG/0.8ML PNKT Inject 40 mg into the skin every 21 ( twenty-one) days. Every 2 weeks    . calcium carbonate (TUMS EX) 750 MG chewable tablet Chew 1 tablet by mouth daily.    . Fluticasone-Salmeterol (ADVAIR) 250-50 MCG/DOSE AEPB Inhale 1 puff into the lungs 2 (two) times daily.    . Levothyroxine Sodium (SYNTHROID PO) Take 50 mg by mouth daily.     . Probiotic Product (PROBIOTIC ADVANCED PO) Take 1 capsule by mouth daily.    . timolol (TIMOPTIC) 0.5 % ophthalmic solution Place 1 drop into both eyes 2 (two) times daily.    Marland Kitchen umeclidinium-vilanterol (ANORO ELLIPTA) 62.5-25 MCG/INH AEPB Inhale 1 puff into the lungs daily. 1 each 6  . VENTOLIN HFA 108 (90 Base) MCG/ACT inhaler   4  . Ipratropium-Albuterol (COMBIVENT IN) Inhale into the lungs as needed.     . tiotropium (SPIRIVA) 18 MCG inhalation capsule Place 18 mcg into inhaler and inhale daily.     No current  facility-administered medications for this visit.      Past Medical History:  Diagnosis Date  . Arthritis   . Asthma   . COPD (chronic obstructive pulmonary disease) ([place])   . GERD (gastroesophageal reflux disease)   . Hypothyroidism   . Multiple thyroid nodules    post excision  . Nephrolithiasis    spontaneously passed    Past Surgical History:  Procedure Laterality Date  . ABDOMINAL HYSTERECTOMY    . ANKLE SURGERY     left fracture  . BACK SURGERY     herniated disk  . COLONOSCOPY WITH PROPOFOL N/A 11/02/2014   Procedure: COLONOSCOPY WITH PROPOFOL;  Surgeon: Josefine Class, MD;  Location: [facility] ENDOSCOPY;  Service: Endoscopy;  Laterality: N/A;  . excision of thyroid nodule    . JOINT REPLACEMENT      Family History  Problem Relation Age of Onset  . Heart disease Mother        died at 27  No bleeding disorders, clotting disorders, or aneurysms  Social History Social History  Substance Use Topics  . Smoking status: Former Smoker    Packs/day: 1.00    Types: Cigarettes  . Smokeless tobacco: Never Used  . Alcohol use Yes     Comment: occassionally  No IVDU  Allergies  Allergen Reactions  . Augmentin [Amoxicillin-Pot Clavulanate]   . Codeine Nausea Only    "  dizzy"  . Ibuprofen Hives and Swelling  . Levaquin [Levofloxacin In D5w]   . Morphine And Related     "Makes me see bugs."        REVIEW OF SYSTEMS (Negative unless checked)  Constitutional: [] Weight loss  [] Fever  [] Chills Cardiac: [] Chest pain   [] Chest pressure   [] Palpitations   [] Shortness of breath when laying flat   [] Shortness of breath at rest   [x] Shortness of breath with exertion. Vascular:  [] Pain in legs with walking   [] Pain in legs at rest   [] Pain in legs when laying flat   [] Claudication   [] Pain in feet when walking  [] Pain in feet at rest  [] Pain in feet when laying flat   [] History of DVT   [] Phlebitis   [x] Swelling in legs   [] Varicose veins   [] Non-healing ulcers Pulmonary:    [] Uses home oxygen   [] Productive cough   [] Hemoptysis   [] Wheeze  [x] COPD   [x] Asthma Neurologic:  [] Dizziness  [] Blackouts   [] Seizures   [] History of stroke   [] History of TIA  [] Aphasia   [] Temporary blindness   [] Dysphagia   [] Weakness or numbness in arms   [] Weakness or numbness in legs Musculoskeletal:  [x] Arthritis   [] Joint swelling   [] Joint pain   [] Low back pain Hematologic:  [] Easy bruising  [] Easy bleeding   [] Hypercoagulable state   [] Anemic  [] Hepatitis  Gastrointestinal:  [] Blood in stool   [] Vomiting blood  [x] Gastroesophageal reflux/heartburn   [] Difficulty swallowing   Genitourinary:  [] Chronic kidney disease   [] Difficult urination  [] Frequent urination  [] Burning with urination   [] Blood in urine Skin:  [] Rashes   [] Ulcers   [] Wounds Psychological:  [] History of anxiety   []  History of major depression.  Physical Exam BP (!) 142/86   Pulse 71   Resp 16   Ht 5' 2"  (1.575 m)   Wt 123 lb (55.8 kg)   BMI 22.50 kg/m   Gen:  WD/WN, NAD. Appears younger than stated age Head: [place]/AT, No temporalis wasting.  Ear/Nose/Throat: Hearing grossly intact, nares w/o erythema or drainage, oropharynx w/o Erythema/Exudate Eyes: Sclera non-icteric, conjunctiva clear Neck: Trachea midline.  No JVD.  Pulmonary:  Good air movement, no use of accessory muscles.  Cardiac: RRR, normal S1, S2. Vascular:  Vessel Right Left  Radial Palpable Palpable  Ulnar Palpable Palpable  Brachial Palpable Palpable  Carotid Palpable, without bruit Palpable, without bruit  Aorta Not palpable N/A  Femoral Palpable Palpable  Popliteal Palpable Palpable  PT 1+ Palpable Not Palpable  DP Palpable Palpable   Gastrointestinal: soft, non-tender/non-distended.  Musculoskeletal: M/S 5/5 throughout.  Extremities without ischemic changes. No right lower extremity edema, 1+ left lower extremity edema. Varicosities scattered on the right and diffuse on the left with more stasis dermatitis changes present on the  left lower leg  Neurologic:  Sensation grossly intact in extremities.  Symmetrical.  Speech is fluent. Motor exam as listed above. Psychiatric: Judgment intact, Mood & affect appropriate for pt's clinical situation. Dermatologic: No rashes or ulcers noted.  No cellulitis or open wounds.     Radiology No results found.  Labs No results found for this or any previous visit (from the past 2160 hour(s)).  Assessment/Plan:  Swelling of limb Symptoms are worrisome for venous disease, but certainly lymphedema may be present as well. Venous duplex to be performed at her convenience. The patient is to begin wearing compression stockings regularly. She says she cannot put on the regular graduated compression  stockings as she was instructed to use these zippered compression stockings daily. Leg elevation, anti-inflammatories, and increasing activity would also be of benefit. We will see the patient back following her venous study to discuss the results and determine further treatment options.   Pain in limb Venous disease is likely but certainly arthritis or other musculoskeletal issues could be contributing.  Varicose veins of leg with pain, left Symptoms are worrisome for venous disease, but certainly lymphedema may be present as well. Venous duplex to be performed at her convenience. The patient is to begin wearing compression stockings regularly. She says she cannot put on the regular graduated compression stockings as she was instructed to use these zippered compression stockings daily. Leg elevation, anti-inflammatories, and increasing activity would also be of benefit. We will see the patient back following her venous study to discuss the results and determine further treatment options.      Leotis Pain 10/10/2016, 2:29 PM   This note was created with [facility] medical dictation system.  Any errors from dictation are unintentional.

## 2016-10-10 NOTE — Assessment & Plan Note (Signed)
Symptoms are worrisome for venous disease, but certainly lymphedema may be present as well. Venous duplex to be performed at her convenience. The patient is to begin wearing compression stockings regularly. She says she cannot put on the regular graduated compression stockings as she was instructed to use these zippered compression stockings daily. Leg elevation, anti-inflammatories, and increasing activity would also be of benefit. We will see the patient back following her venous study to discuss the results and determine further treatment options.

## 2016-10-10 NOTE — Patient Instructions (Signed)

## 2016-10-10 NOTE — Assessment & Plan Note (Signed)
Venous disease is likely but certainly arthritis or other musculoskeletal issues could be contributing.

## 2016-11-17 ENCOUNTER — Ambulatory Visit (INDEPENDENT_AMBULATORY_CARE_PROVIDER_SITE_OTHER): Payer: Medicare Other | Admitting: Internal Medicine

## 2016-11-17 ENCOUNTER — Encounter: Payer: Self-pay | Admitting: Internal Medicine

## 2016-11-17 VITALS — BP 144/90 | HR 69 | Resp 16 | Ht 62.0 in | Wt 124.0 lb

## 2016-11-17 DIAGNOSIS — J449 Chronic obstructive pulmonary disease, unspecified: Secondary | ICD-10-CM

## 2016-11-17 NOTE — Addendum Note (Signed)
Addended by: Devona Konig on: 11/17/2016 01:46 PM   Modules accepted: Orders

## 2016-11-17 NOTE — Progress Notes (Signed)
[facility] Consultation      Date: 11/17/2016,   MRN# [id number] Sheila Peters 01-11-1941 Code Status:  Code Status History    This patient does not have a recorded code status. Please follow your organizational policy for patients in this situation.     Hosp day:@LENGTHOFSTAYDAYS @ Referring MD: @ATDPROV @     PCP:      AdmissionWeight: 124 lb (56.2 kg)                 CurrentWeight: 124 lb (56.2 kg) Sheila Peters is a 76 y.o. old female seen in consultation for abnormal Ct chest at the request of Dr. Ginette Pitman     CHIEF COMPLAINT:   Abnormal Ct chest   HISTORY OF PRESENT ILLNESS  76 yo white female for follow seen today for abnormal CT chest, patient has h/o COPD Patient has had previous low dose lung cancer screening CT last year shows 5 MM RML nodule Patient has follow up CT chest in July 2017 which showed new LLL opacity and RUL nodular opacity and the previous 5 MM nodule has decreased in size-current CT chest 01/14/16  shows much improvement of LLL and RUL opacity  Current CT chest 08/03/16 shows resolution of RML nodule and also stable LLL opacity c/w scarring   she has no acute symptoms at this time Denies fevers, chills, night sweats, NVD. No cough no wheezing and no SOB, but has DOE that seems to be at baseline  Patient has h/o UC and was treated heavy dose of prednisone 4 months ago  No signs of infection at this time   At this time patient is only using Advair at this time and it is helping her symptoms  PFT's 01/14/16 Fev1 49% Fev1 0.97 L  ratio 49% DLCO 49%  CT chest 4/18 RML nodule approx 5MM resolved since last CT Left Lower lobe residual opacity-likely scar tissue-no change in size since last CT chest PFT's 01/14/16    Current Medication:  Current Outpatient Prescriptions:  .  acetaminophen (TYLENOL) 500 MG tablet, Take 500 mg by mouth every 6 (six) hours as needed., Disp: , Rfl:  .  Adalimumab 40 MG/0.8ML PNKT, Inject 40  mg into the skin every 21 ( twenty-one) days. Every 2 weeks, Disp: , Rfl:  .  calcium carbonate (TUMS EX) 750 MG chewable tablet, Chew 1 tablet by mouth daily., Disp: , Rfl:  .  Fluticasone-Salmeterol (ADVAIR) 250-50 MCG/DOSE AEPB, Inhale 1 puff into the lungs 2 (two) times daily., Disp: , Rfl:  .  Ipratropium-Albuterol (COMBIVENT IN), Inhale into the lungs as needed. , Disp: , Rfl:  .  Levothyroxine Sodium (SYNTHROID PO), Take 50 mg by mouth daily. , Disp: , Rfl:  .  Probiotic Product (PROBIOTIC ADVANCED PO), Take 1 capsule by mouth daily., Disp: , Rfl:  .  timolol (TIMOPTIC) 0.5 % ophthalmic solution, Place 1 drop into both eyes 2 (two) times daily., Disp: , Rfl:  .  tiotropium (SPIRIVA) 18 MCG inhalation capsule, Place 18 mcg into inhaler and inhale daily., Disp: , Rfl:  .  umeclidinium-vilanterol (ANORO ELLIPTA) 62.5-25 MCG/INH AEPB, Inhale 1 puff into the lungs daily., Disp: 1 each, Rfl: 6 .  VENTOLIN HFA 108 (90 Base) MCG/ACT inhaler, , Disp: , Rfl: 4    ALLERGIES   Augmentin [amoxicillin-pot clavulanate]; Codeine; Ibuprofen; Levaquin [levofloxacin in d5w]; and Morphine and related     REVIEW OF SYSTEMS   Review of Systems  Constitutional: Negative for chills, diaphoresis, fever,  malaise/fatigue and weight loss.  HENT: Negative for congestion.   Respiratory: Positive for shortness of breath. Negative for cough, hemoptysis, sputum production and wheezing.   Cardiovascular: Negative for chest pain, palpitations, orthopnea and leg swelling.  Gastrointestinal: Negative for abdominal pain, heartburn, nausea and vomiting.  Musculoskeletal: Negative for myalgias.  Neurological: Negative for weakness.  All other systems reviewed and are negative.   BP (!) 144/90 (BP Location: Left Arm, Cuff Size: Normal)   Pulse 69   Resp 16   Ht 5' 2"  (1.575 m)   Wt 124 lb (56.2 kg)   SpO2 94%   BMI 22.68 kg/m     PHYSICAL EXAM  Physical Exam  Constitutional: She is oriented to person,  place, and time. She appears well-developed and well-nourished. No distress.  HENT:  Mouth/Throat: No oropharyngeal exudate.  Cardiovascular: Normal rate, regular rhythm and normal heart sounds.   No murmur heard. Pulmonary/Chest: Effort normal and breath sounds normal. No stridor. No respiratory distress. She has no wheezes. She has no rales.  Musculoskeletal: Normal range of motion. She exhibits no edema.  Neurological: She is alert and oriented to person, place, and time. No cranial nerve deficit.  Skin: Skin is warm. She is not diaphoretic.  Psychiatric: She has a normal mood and affect.             IMAGING   Compared to CT chest 01/14/16 RML nodule approx 5MM resolved  Left Lower lobe residual opacity-likely scar tissue/rounded atelactasis   CT chest June 2017 RML decreased in size from 5 mm. RUL nodular opacity LLL opacity with air bronchogram signs  CT chest July 2016 RML nodule approx 5 mm  ASSESSMENT/PLAN   76 yo white female former smoker with h/o moderate/severe COPD gold stage A with abnormal CT chest with stable/decreased RML 5 MM nodule with resolving  LLL opacity now with left lower lobe scar/rounded atelectasis    #1 shortness of breath and dyspnea exertion related to COPD and deconditioned state Will need to assess for hypoxia will check for 6 minute walk test and overnight pulse oximetry  #2 COPD moderate to severe Gold stage a Will continue to prescribed Advair Will stop ANORO and stop Spiriva Albuterol as needed   #3 deconditioned state Recommend exercise as tolerated  #4 abnormal CT scan Left lower lobe residual scar tissue in rounded atelectasis consider repeat CT chest in the next 6-12 months    Follow up in 6 months   Patient  satisfied with Plan of action and management. All questions answered  Corrin Parker, M.D.  [facility]  Medical Director [place] Director [facility]  Cardio-Pulmonary Department

## 2016-11-17 NOTE — Patient Instructions (Signed)
WILL USE ADVAIR ONLY AT THIS TIME Check ONO, and 6 MWT

## 2016-11-22 ENCOUNTER — Ambulatory Visit (INDEPENDENT_AMBULATORY_CARE_PROVIDER_SITE_OTHER): Payer: Medicare Other | Admitting: *Deleted

## 2016-11-22 DIAGNOSIS — R0602 Shortness of breath: Secondary | ICD-10-CM | POA: Diagnosis not present

## 2016-11-22 NOTE — Progress Notes (Signed)
SMW performed today.   SIX MIN WALK 01/14/2016  Medications Prednisone, Levotyhroxine, Immodium, Benadryl  Supplimental Oxygen during Test? (L/min) No  Laps 5  Partial Lap (in Meters) 21  Baseline BP (sitting) 118/66  Baseline Heartrate 100  Baseline Dyspnea (Borg Scale) 2  Baseline Fatigue (Borg Scale) 2  Baseline SPO2 90  BP (sitting) 146/74  Heartrate 107  Dyspnea (Borg Scale) 3  Fatigue (Borg Scale) 3  SPO2 90  BP (sitting) 126/62  Heartrate 98  SPO2 94  Stopped or Paused before Six Minutes No  Distance Completed 261  Tech Comments: Pt wore dlip flops which caused pt to walk slower.

## 2016-11-30 ENCOUNTER — Encounter: Payer: Self-pay | Admitting: Internal Medicine

## 2016-11-30 DIAGNOSIS — J449 Chronic obstructive pulmonary disease, unspecified: Secondary | ICD-10-CM

## 2016-12-01 ENCOUNTER — Ambulatory Visit (INDEPENDENT_AMBULATORY_CARE_PROVIDER_SITE_OTHER): Payer: Medicare Other

## 2016-12-01 ENCOUNTER — Encounter (INDEPENDENT_AMBULATORY_CARE_PROVIDER_SITE_OTHER): Payer: Self-pay | Admitting: Vascular Surgery

## 2016-12-01 ENCOUNTER — Ambulatory Visit (INDEPENDENT_AMBULATORY_CARE_PROVIDER_SITE_OTHER): Payer: Medicare Other | Admitting: Vascular Surgery

## 2016-12-01 VITALS — BP 157/86 | HR 71 | Resp 14 | Ht 62.0 in | Wt 127.0 lb

## 2016-12-01 DIAGNOSIS — I83812 Varicose veins of left lower extremities with pain: Secondary | ICD-10-CM

## 2016-12-01 DIAGNOSIS — M79605 Pain in left leg: Secondary | ICD-10-CM | POA: Diagnosis not present

## 2016-12-01 NOTE — Assessment & Plan Note (Signed)
Her venous duplex today shows no evidence of deep venous thrombosis or superficial thrombophlebitis. Venous reflux was present in the left great saphenous vein and the left small saphenous vein. Given these findings, certainly her venous insufficiency is playing at least a contributing role in her symptoms. Her symptoms are significantly better and she does not want to consider any intervention at this time which is certainly reasonable. She said she like to call us for symptoms get worse so I will see her back on an as-needed basis

## 2016-12-01 NOTE — Progress Notes (Signed)
MRN : [id number]  Sheila Peters is a 76 y.o. (05-03-1940) female who presents with chief complaint of  Chief Complaint  Patient presents with  . Follow-up    Ultrasound reflux left leg  .  History of Present Illness: Patient returns in follow up for leg pain and swelling. Her legs have done better since her last visit. She has really not been wearing her compression stockings much, but she has been elevating her legs and has been very active. She has no new ulceration or infection. Her venous duplex today shows no evidence of deep venous thrombosis or superficial thrombophlebitis. Venous reflux was present in the left great saphenous vein and the left small saphenous vein.Marland Kitchen    Past Medical History:  Diagnosis Date  . Arthritis   . Asthma   . COPD (chronic obstructive pulmonary disease) ([place])   . GERD (gastroesophageal reflux disease)   . Hypothyroidism   . Multiple thyroid nodules    post excision  . Nephrolithiasis    spontaneously passed    Past Surgical History:  Procedure Laterality Date  . ABDOMINAL HYSTERECTOMY    . ANKLE SURGERY     left fracture  . BACK SURGERY     herniated disk  . COLONOSCOPY WITH PROPOFOL N/A 11/02/2014   Procedure: COLONOSCOPY WITH PROPOFOL;  Surgeon: Josefine Class, MD;  Location: [facility] ENDOSCOPY;  Service: Endoscopy;  Laterality: N/A;  . excision of thyroid nodule    . JOINT REPLACEMENT            Family History  Problem Relation Age of Onset  . Heart disease Mother        died at 68  No bleeding disorders, clotting disorders, or aneurysms  Social History        Social History  Substance Use Topics  . Smoking status: Former Smoker    Packs/day: 1.00    Types: Cigarettes  . Smokeless tobacco: Never Used  . Alcohol use Yes      Comment: occassionally  No IVDU       Allergies  Allergen Reactions  . Augmentin [Amoxicillin-Pot Clavulanate]   . Codeine Nausea Only    "dizzy"  . Ibuprofen Hives and  Swelling  . Levaquin [Levofloxacin In D5w]   . Morphine And Related     "Makes me see bugs."        REVIEW OF SYSTEMS (Negative unless checked)  Constitutional: [] Weight loss  [] Fever  [] Chills Cardiac: [] Chest pain   [] Chest pressure   [] Palpitations   [] Shortness of breath when laying flat   [] Shortness of breath at rest   [x] Shortness of breath with exertion. Vascular:  [] Pain in legs with walking   [] Pain in legs at rest   [] Pain in legs when laying flat   [] Claudication   [] Pain in feet when walking  [] Pain in feet at rest  [] Pain in feet when laying flat   [] History of DVT   [] Phlebitis   [x] Swelling in legs   [] Varicose veins   [] Non-healing ulcers Pulmonary:   [] Uses home oxygen   [] Productive cough   [] Hemoptysis   [] Wheeze  [x] COPD   [x] Asthma Neurologic:  [] Dizziness  [] Blackouts   [] Seizures   [] History of stroke   [] History of TIA  [] Aphasia   [] Temporary blindness   [] Dysphagia   [] Weakness or numbness in arms   [] Weakness or numbness in legs Musculoskeletal:  [x] Arthritis   [] Joint swelling   [] Joint pain   [] Low back pain Hematologic:  []   Easy bruising  [] Easy bleeding   [] Hypercoagulable state   [] Anemic  [] Hepatitis  Gastrointestinal:  [] Blood in stool   [] Vomiting blood  [x] Gastroesophageal reflux/heartburn   [] Difficulty swallowing   Genitourinary:  [] Chronic kidney disease   [] Difficult urination  [] Frequent urination  [] Burning with urination   [] Blood in urine Skin:  [] Rashes   [] Ulcers   [] Wounds Psychological:  [] History of anxiety   []  History of major depression.   Physical Examination  Vitals:   12/01/16 1410  BP: (!) 157/86  Pulse: 71  Resp: 14  Weight: 127 lb (57.6 kg)  Height: 5' 2"  (1.575 m)   Body mass index is 23.23 kg/m. Gen:  WD/WN, NAD, appears younger than stated age Head: [place]/AT, No temporalis wasting. Ear/Nose/Throat: Hearing grossly intact, dentition good, trachea midline Eyes: Conjunctiva clear. Sclera non-icteric Neck:  Supple.  No JVD. Trachea midline Pulmonary:  Good air movement, respirations not labored, no use of accessory muscles.  Cardiac: RRR, normal S1, S2. Vascular: Scattered varicosities on the right with diffuse varicosities on the left. Vessel Right Left  Radial Palpable Palpable                                   Musculoskeletal: M/S 5/5 throughout.  No deformity or atrophy. 1+ left lower extremity edema. Neurologic: Sensation grossly intact in extremities.  Symmetrical.  Speech is fluent. Psychiatric: Judgment intact, Mood & affect appropriate for pt's clinical situation. Dermatologic: No rashes or ulcers noted.  No cellulitis or open wounds.      Labs No results found for this or any previous visit (from the past 2160 hour(s)).  Radiology No results found.    Assessment/Plan  Pain in limb Her venous duplex today shows no evidence of deep venous thrombosis or superficial thrombophlebitis. Venous reflux was present in the left great saphenous vein and the left small saphenous vein. Given these findings, certainly her venous insufficiency is playing at least a contributing role in her symptoms. Her symptoms are significantly better and she does not want to consider any intervention at this time which is certainly reasonable. She said she like to call us for symptoms get worse so I will see her back on an as-needed basis  Varicose veins of leg with pain, left Venous reflux was present in the left great saphenous vein and the left small saphenous vein. Given these findings, certainly her venous insufficiency is playing at least a contributing role in her symptoms. Her symptoms are significantly better and she does not want to consider any intervention at this time which is certainly reasonable. She said she like to call us for symptoms get worse so I will see her back on an as-needed basis    Leotis Pain, MD  12/01/2016 3:25 PM    This note was created with Dragon medical  transcription system.  Any errors from dictation are purely unintentional

## 2016-12-01 NOTE — Assessment & Plan Note (Signed)
Venous reflux was present in the left great saphenous vein and the left small saphenous vein. Given these findings, certainly her venous insufficiency is playing at least a contributing role in her symptoms. Her symptoms are significantly better and she does not want to consider any intervention at this time which is certainly reasonable. She said she like to call us for symptoms get worse so I will see her back on an as-needed basis

## 2016-12-08 ENCOUNTER — Telehealth: Payer: Self-pay | Admitting: Internal Medicine

## 2016-12-08 DIAGNOSIS — J449 Chronic obstructive pulmonary disease, unspecified: Secondary | ICD-10-CM

## 2016-12-08 NOTE — Telephone Encounter (Signed)
Patient aware of pulse oximetry results. Patient states she already had 02 in the home 2L prn. (APS). Orders entered Nothing further needed.

## 2017-02-09 ENCOUNTER — Other Ambulatory Visit
Admission: RE | Admit: 2017-02-09 | Discharge: 2017-02-09 | Disposition: A | Discharge status: Home / Self Care | Payer: Medicare Other | Source: Other Acute Inpatient Hospital | Attending: Unknown Physician Specialty | Admitting: Unknown Physician Specialty

## 2017-02-09 DIAGNOSIS — R197 Diarrhea, unspecified: Secondary | ICD-10-CM | POA: Diagnosis present

## 2017-02-09 LAB — GASTROINTESTINAL PANEL BY PCR, STOOL (REPLACES STOOL CULTURE)
ADENOVIRUS F40/41: NOT DETECTED
Astrovirus: NOT DETECTED
Campylobacter species: NOT DETECTED
Cryptosporidium: NOT DETECTED
Cyclospora cayetanensis: NOT DETECTED
ENTAMOEBA HISTOLYTICA: NOT DETECTED
ENTEROPATHOGENIC E COLI (EPEC): NOT DETECTED
Enteroaggregative E coli (EAEC): NOT DETECTED
Enterotoxigenic E coli (ETEC): NOT DETECTED
Giardia lamblia: NOT DETECTED
Norovirus GI/GII: NOT DETECTED
PLESIMONAS SHIGELLOIDES: NOT DETECTED
Rotavirus A: NOT DETECTED
SAPOVIRUS (I, II, IV, AND V): NOT DETECTED
SHIGA LIKE TOXIN PRODUCING E COLI (STEC): NOT DETECTED
SHIGELLA/ENTEROINVASIVE E COLI (EIEC): NOT DETECTED
Salmonella species: NOT DETECTED
VIBRIO SPECIES: NOT DETECTED
Vibrio cholerae: NOT DETECTED
YERSINIA ENTEROCOLITICA: NOT DETECTED

## 2017-02-09 LAB — C DIFFICILE QUICK SCREEN W PCR REFLEX
C DIFFICILE (CDIFF) INTERP: DETECTED
C DIFFICILE (CDIFF) TOXIN: POSITIVE — AB
C DIFFICLE (CDIFF) ANTIGEN: POSITIVE — AB

## 2017-02-18 LAB — CALPROTECTIN, FECAL: CALPROTECTIN, FECAL: 295 ug/g — AB (ref 0–120)

## 2017-02-23 ENCOUNTER — Encounter: Payer: Self-pay | Admitting: *Deleted

## 2017-02-26 ENCOUNTER — Ambulatory Visit
Admission: RE | Admit: 2017-02-26 | Discharge: 2017-02-26 | Disposition: A | Discharge status: Home / Self Care | Payer: Medicare Other | Source: Ambulatory Visit | Attending: Unknown Physician Specialty | Admitting: Unknown Physician Specialty

## 2017-02-26 ENCOUNTER — Encounter
Admission: RE | Disposition: A | Discharge status: Home / Self Care | Payer: Self-pay | Source: Ambulatory Visit | Attending: Unknown Physician Specialty

## 2017-02-26 ENCOUNTER — Ambulatory Visit: Payer: Medicare Other | Admitting: Anesthesiology

## 2017-02-26 DIAGNOSIS — Z9981 Dependence on supplemental oxygen: Secondary | ICD-10-CM | POA: Diagnosis not present

## 2017-02-26 DIAGNOSIS — J449 Chronic obstructive pulmonary disease, unspecified: Secondary | ICD-10-CM | POA: Insufficient documentation

## 2017-02-26 DIAGNOSIS — Z881 Allergy status to other antibiotic agents status: Secondary | ICD-10-CM | POA: Insufficient documentation

## 2017-02-26 DIAGNOSIS — M1990 Unspecified osteoarthritis, unspecified site: Secondary | ICD-10-CM | POA: Insufficient documentation

## 2017-02-26 DIAGNOSIS — A0471 Enterocolitis due to Clostridium difficile, recurrent: Secondary | ICD-10-CM | POA: Diagnosis present

## 2017-02-26 DIAGNOSIS — Z90710 Acquired absence of both cervix and uterus: Secondary | ICD-10-CM | POA: Diagnosis not present

## 2017-02-26 DIAGNOSIS — Z8711 Personal history of peptic ulcer disease: Secondary | ICD-10-CM | POA: Insufficient documentation

## 2017-02-26 DIAGNOSIS — Z79899 Other long term (current) drug therapy: Secondary | ICD-10-CM | POA: Diagnosis not present

## 2017-02-26 DIAGNOSIS — Z87891 Personal history of nicotine dependence: Secondary | ICD-10-CM | POA: Insufficient documentation

## 2017-02-26 DIAGNOSIS — Z885 Allergy status to narcotic agent status: Secondary | ICD-10-CM | POA: Diagnosis not present

## 2017-02-26 DIAGNOSIS — K5150 Left sided colitis without complications: Secondary | ICD-10-CM | POA: Diagnosis not present

## 2017-02-26 DIAGNOSIS — K219 Gastro-esophageal reflux disease without esophagitis: Secondary | ICD-10-CM | POA: Diagnosis not present

## 2017-02-26 DIAGNOSIS — Z87442 Personal history of urinary calculi: Secondary | ICD-10-CM | POA: Insufficient documentation

## 2017-02-26 DIAGNOSIS — Z886 Allergy status to analgesic agent status: Secondary | ICD-10-CM | POA: Diagnosis not present

## 2017-02-26 DIAGNOSIS — E039 Hypothyroidism, unspecified: Secondary | ICD-10-CM | POA: Diagnosis not present

## 2017-02-26 DIAGNOSIS — M810 Age-related osteoporosis without current pathological fracture: Secondary | ICD-10-CM | POA: Insufficient documentation

## 2017-02-26 DIAGNOSIS — Z8249 Family history of ischemic heart disease and other diseases of the circulatory system: Secondary | ICD-10-CM | POA: Diagnosis not present

## 2017-02-26 HISTORY — DX: Age-related osteoporosis without current pathological fracture: M81.0

## 2017-02-26 HISTORY — DX: Nontoxic single thyroid nodule: E04.1

## 2017-02-26 HISTORY — DX: Enterocolitis due to Clostridium difficile, not specified as recurrent: A04.72

## 2017-02-26 HISTORY — DX: Ulcerative colitis, unspecified, without complications: K51.90

## 2017-02-26 HISTORY — DX: Noninfective gastroenteritis and colitis, unspecified: K52.9

## 2017-02-26 HISTORY — PX: COLONOSCOPY: SHX5424

## 2017-02-26 HISTORY — PX: FECAL TRANSPLANT: SHX6383

## 2017-02-26 HISTORY — DX: Ulcerative (chronic) pancolitis without complications: K51.00

## 2017-02-26 SURGERY — COLONOSCOPY
Anesthesia: General

## 2017-02-26 MED ORDER — SODIUM CHLORIDE 0.9 % IV SOLN
INTRAVENOUS | Status: DC
  Administered 2017-02-26 (×2): via INTRAVENOUS

## 2017-02-26 MED ORDER — LIDOCAINE 2% (20 MG/ML) 5 ML SYRINGE
INTRAMUSCULAR | Status: DC | PRN
  Administered 2017-02-26: 40 mg via INTRAVENOUS

## 2017-02-26 MED ORDER — FENTANYL CITRATE (PF) 100 MCG/2ML IJ SOLN
INTRAMUSCULAR | Status: DC | PRN
  Administered 2017-02-26: 50 ug via INTRAVENOUS

## 2017-02-26 MED ORDER — PROPOFOL 500 MG/50ML IV EMUL
INTRAVENOUS | Status: DC | PRN
  Administered 2017-02-26: 140 ug/kg/min via INTRAVENOUS

## 2017-02-26 MED ORDER — FENTANYL CITRATE (PF) 100 MCG/2ML IJ SOLN
INTRAMUSCULAR | Status: AC
  Filled 2017-02-26: qty 2

## 2017-02-26 MED ORDER — PROPOFOL 10 MG/ML IV BOLUS
INTRAVENOUS | Status: DC | PRN
Start: 2017-02-26 — End: 2017-02-26
  Administered 2017-02-26: 80 mg via INTRAVENOUS

## 2017-02-26 MED ORDER — EPHEDRINE SULFATE 50 MG/ML IJ SOLN
INTRAMUSCULAR | Status: DC | PRN
  Administered 2017-02-26: 10 mg via INTRAVENOUS

## 2017-02-26 MED ORDER — LIDOCAINE HCL (PF) 2 % IJ SOLN
INTRAMUSCULAR | Status: AC
  Filled 2017-02-26: qty 10

## 2017-02-26 NOTE — Anesthesia Post-op Follow-up Note (Signed)
Anesthesia QCDR form completed.        

## 2017-02-26 NOTE — Transfer of Care (Signed)
Immediate Anesthesia Transfer of Care Note  Patient: Sheila Peters  Procedure(s) Performed: COLONOSCOPY (N/A ) FECAL TRANSPLANT (N/A )  Patient Location: PACU and Endoscopy Unit  Anesthesia Type:General  Level of Consciousness: awake, oriented and patient cooperative  Airway & Oxygen Therapy: Patient Spontanous Breathing and Patient connected to nasal cannula oxygen  Post-op Assessment: Report given to RN and Post -op Vital signs reviewed and stable  Post vital signs: Reviewed and stable  Last Vitals:  Vitals:   02/26/17 1050  BP: 119/75  Pulse: 61  Resp: 16  Temp: 36.6 C  SpO2: 94%    Last Pain:  Vitals:   02/26/17 1050  TempSrc: Oral         Complications: No apparent anesthesia complications

## 2017-02-26 NOTE — Anesthesia Postprocedure Evaluation (Signed)
Anesthesia Post Note  Patient: Sheila Peters  Procedure(s) Performed: COLONOSCOPY (N/A ) FECAL TRANSPLANT (N/A )  Patient location during evaluation: Endoscopy Anesthesia Type: General Level of consciousness: awake and alert and oriented Pain management: pain level controlled Vital Signs Assessment: post-procedure vital signs reviewed and stable Respiratory status: spontaneous breathing, nonlabored ventilation and respiratory function stable Cardiovascular status: blood pressure returned to baseline and stable Postop Assessment: no signs of nausea or vomiting Anesthetic complications: no     Last Vitals:  Vitals:   02/26/17 1202 02/26/17 1203  BP: (!) 86/47 (!) 86/47  Pulse: (!) 58 61  Resp: 19 17  Temp: (!) 36.2 C   SpO2: 99% 99%    Last Pain:  Vitals:   02/26/17 1202  TempSrc: Tympanic                 Britta Louth

## 2017-02-26 NOTE — OR Nursing (Signed)
Syringes of stool was placed in colon by MD. No biopsy was done. Patient tolerated procedure well.

## 2017-02-26 NOTE — Anesthesia Preprocedure Evaluation (Signed)
Anesthesia Evaluation  Patient identified by MRN, date of birth, ID band Patient awake    Reviewed: Allergy & Precautions, NPO status , Patient's Chart, lab work & pertinent test results  History of Anesthesia Complications Negative for: history of anesthetic complications  Airway Mallampati: II  TM Distance: >3 FB Neck ROM: Full    Dental no notable dental hx.    Pulmonary neg sleep apnea, COPD (intermittent home O2 use),  COPD inhaler and oxygen dependent, former smoker,    breath sounds clear to auscultation- rhonchi (-) wheezing      Cardiovascular Exercise Tolerance: Good (-) hypertension(-) CAD, (-) Past MI and (-) Cardiac Stents  Rhythm:Regular Rate:Normal - Systolic murmurs and - Diastolic murmurs    Neuro/Psych negative neurological ROS  negative psych ROS   GI/Hepatic Neg liver ROS, PUD, GERD  ,  Endo/Other  neg diabetesHypothyroidism   Renal/GU Renal disease: hx of nephrolithiasis.     Musculoskeletal  (+) Arthritis ,   Abdominal (+) - obese,   Peds  Hematology negative hematology ROS (+)   Anesthesia Other Findings Past Medical History: No date: Arthritis No date: Asthma No date: Clostridium difficile diarrhea No date: Colitis, nonspecific No date: COPD (chronic obstructive pulmonary disease) (HCC) No date: GERD (gastroesophageal reflux disease) No date: Hypothyroidism     Comment:  post-surgical No date: Multiple thyroid nodules     Comment:  post excision No date: Nephrolithiasis     Comment:  spontaneously passed No date: Nephrolithiasis No date: Non-toxic uninodular goiter No date: Osteoporosis, postmenopausal No date: Ulcerative colitis, chronic (HCC) No date: 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)   Reproductive/Obstetrics                             Anesthesia Physical Anesthesia Plan  ASA: III  Anesthesia Plan: General   Post-op Pain  Management:    Induction: Intravenous  PONV Risk Score and Plan: 2 and Propofol infusion  Airway Management Planned: Natural Airway  Additional Equipment:   Intra-op Plan:   Post-operative Plan:   Informed Consent: I have reviewed the patients History and Physical, chart, labs and discussed the procedure including the risks, benefits and alternatives for the proposed anesthesia with the patient or authorized representative who has indicated his/her understanding and acceptance.   Dental advisory given  Plan Discussed with: CRNA and Anesthesiologist  Anesthesia Plan Comments:         Anesthesia Quick Evaluation

## 2017-02-26 NOTE — Op Note (Signed)
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 Procedure:            Pre-Anesthesia Assessment:                       - After reviewing the risks and benefits, the patient                        was deemed in satisfactory condition to undergo the                        procedure.                       After obtaining informed consent, the colonoscope was                        passed under direct vision. Throughout the procedure,                        the patient's blood pressure, pulse, and oxygen                        saturations were monitored continuously. The                        Colonoscope was introduced through the anus and                        advanced to the the cecum, identified by appendiceal                        orifice and ileocecal valve. The colonoscopy was                        performed without difficulty. The patient tolerated the                        procedure well. The quality of the bowel preparation                        was excellent. Findings:      Inflammation characterized by Mucosal ulcerative colitis with erythema,       friability and loss of vascularity  was found in a continuous and       circumferential pattern from the anus to the splenic flexure. The       transverse colon, the hepatic  flexure, the ascending colon and the cecum       were spared. This was moderate in severity. Fecal Microbiota Transplant       (Bacteriotherapy): Donor stool was prepared by the donor using milk as       per protocol. Approximately 350 mL of the emulsified donor stool was       instilled in the ascending colon and in the cecum. A detailed       colonoscopic exam could not be performed upon scope withdrawal secondary       to limited visibility from the instilled stool. Impression:           - Left-sided ulcerative colitis. Inflammation was found                        from the anus to the splenic flexure. This was moderate                        in severity.                       - Fecal Microbiota Transplant (Bacteriotherapy)                        performed in the ascending colon and in the cecum.                       - No specimens collected. Recommendation:       - The findings and recommendations were discussed with                        the patient's family. Manya Silvas, MD 02/26/2017 12:03:15 PM This report has been signed electronically. Number of Addenda: 0 Note Initiated On: 02/26/2017 11:37 AM Scope Withdrawal Time: 0 hours 7 minutes 4 seconds  Total Procedure Duration: 0 hours 13 minutes 57 seconds       [facility]

## 2017-03-02 NOTE — H&P (Signed)
Primary Care Physician:  Tracie Harrier, MD Primary Gastroenterologist:  Dr. Vira Agar  Pre-Procedure History & Physical: HPI:  Sheila Peters is a 76 y.o. female is here for an colonoscopy.   Past Medical History:  Diagnosis Date  . Arthritis   . Asthma   . Clostridium difficile diarrhea   . Colitis, nonspecific   . COPD (chronic obstructive pulmonary disease) ([place])   . GERD (gastroesophageal reflux disease)   . Hypothyroidism    post-surgical  . Multiple thyroid nodules    post excision  . Nephrolithiasis    spontaneously passed  . Nephrolithiasis   . Non-toxic uninodular goiter   . Osteoporosis, postmenopausal   . Ulcerative colitis, chronic ([place])   . Ulcerative pancolitis without complication [facility])     Past Surgical History:  Procedure Laterality Date  . ABDOMINAL HYSTERECTOMY    . ANKLE SURGERY     left fracture  . BACK SURGERY     herniated disk  . excision of thyroid nodule    . JOINT REPLACEMENT     2014 lth    Prior to Admission medications   Medication Sig Start Date End Date Taking? Authorizing Provider  acetaminophen (TYLENOL) 500 MG tablet Take 500 mg by mouth every 6 (six) hours as needed.   Yes [provider]  Adalimumab 40 MG/0.8ML PNKT Inject 40 mg into the skin every 21 ( twenty-one) days. Every 2 weeks 05/09/16  Yes [provider]  calcium carbonate (TUMS EX) 750 MG chewable tablet Chew 1 tablet by mouth daily.   Yes [provider]  Fluticasone-Salmeterol (ADVAIR) 250-50 MCG/DOSE AEPB Inhale 1 puff into the lungs 2 (two) times daily.   Yes [provider]  Levothyroxine Sodium (SYNTHROID PO) Take 50 mg by mouth daily.    Yes [provider]  predniSONE (DELTASONE) 10 MG tablet  09/08/16  Yes [provider]  timolol (TIMOPTIC) 0.5 % ophthalmic solution Place 1 drop into both eyes 2 (two) times daily.   Yes [provider]  VENTOLIN HFA 108 (90 Base) MCG/ACT inhaler  09/08/16  Yes  [provider]  vitamin B-12 (CYANOCOBALAMIN) 500 MCG tablet Take 500 mcg by mouth daily.   Yes [provider]  brimonidine-timolol (COMBIGAN) 0.2-0.5 % ophthalmic solution Place 1 drop into both eyes every 12 (twelve) hours.    [provider]  cholecalciferol (VITAMIN D) 400 units TABS tablet Take 400 Units by mouth.    [provider]  fluticasone (FLONASE) 50 MCG/ACT nasal spray Place 2 sprays into both nostrils daily.    [provider]  Ipratropium-Albuterol (COMBIVENT IN) Inhale into the lungs as needed.     [provider]  Probiotic Product (PROBIOTIC ADVANCED PO) Take 1 capsule by mouth daily.    [provider]  tiotropium (SPIRIVA) 18 MCG inhalation capsule Place 18 mcg into inhaler and inhale daily.    [provider]  tiZANidine (ZANAFLEX) 4 MG tablet Take 4 mg by mouth 3 (three) times daily.    [provider]  umeclidinium-vilanterol (ANORO ELLIPTA) 62.5-25 MCG/INH AEPB Inhale 1 puff into the lungs daily. Patient not taking: Reported on 02/26/2017 08/15/16   Flora Lipps, MD    Allergies as of 12/18/2016 - Review Complete 12/01/2016  Allergen Reaction Noted  . Augmentin [amoxicillin-pot clavulanate]  10/30/2014  . Codeine Nausea Only 08/21/2011  . Ibuprofen Hives and Swelling 08/21/2011  . Levaquin [levofloxacin in d5w]  10/30/2014  . Morphine and related  08/21/2011  Family History  Problem Relation Age of Onset  . Heart disease Mother        died at 65    Social History   Socioeconomic History  . Marital status: Married    Spouse name: Not on file  . Number of children: Not on file  . Years of education: Not on file  . Highest education level: Not on file  Social Needs  . Financial resource strain: Not on file  . Food insecurity - worry: Not on file  . Food insecurity - inability: Not on file  . Transportation needs - medical: Not on file  . Transportation needs - non-medical:  Not on file  Occupational History  . Not on file  Tobacco Use  . Smoking status: Former Smoker    Packs/day: 0.50    Years: 50.00    Pack years: 25.00    Types: Cigarettes    Last attempt to quit: 03/05/2014    Years since quitting: 2.9  . Smokeless tobacco: Never Used  Substance and Sexual Activity  . Alcohol use: Yes    Comment: occassionally. none last 36month  . Drug use: No  . Sexual activity: Not on file  Other Topics Concern  . Not on file  Social History Narrative  . Not on file    Review of Systems: See HPI, otherwise negative ROS  Physical Exam: BP (!) 86/47   Pulse 61   Temp (!) 97.2 F (36.2 C) (Tympanic)   Resp 17   Ht 5' 1"  (1.549 m)   Wt 53.5 kg (118 lb)   SpO2 99%   BMI 22.30 kg/m  General:   Alert,  pleasant and cooperative in NAD Head:  Normocephalic and atraumatic. Neck:  Supple; no masses or thyromegaly. Lungs:  Clear throughout to auscultation.    Heart:  Regular rate and rhythm. Abdomen:  Soft, nontender and nondistended. Normal bowel sounds, without guarding, and without rebound.   Neurologic:  Alert and  oriented x4;  grossly normal neurologically.  Impression/Plan: Sheila PELLICANEis here for an colonoscopy to be performed for perform stool transplant for recurrent C. Diff colitis.  Risks, benefits, limitations, and alternatives regarding  colonoscopy have been reviewed with the patient.  Questions have been answered.  All parties agreeable.   Sheila Cheers MD  03/02/2017, 12:42 PM

## 2017-03-16 IMAGING — CT CT CHEST LUNG CANCER SCREENING LOW DOSE W/O CM
1 of 4 series · 10 of 40 positions shown, 13 images · non-contrast
Comparison: 11/18/2014 screening chest CT.

CLINICAL DATA: 75-year-old female former smoker with 46 pack-year
smoking history, quit 2 years prior.

EXAM:
CT CHEST WITHOUT CONTRAST LOW-DOSE FOR LUNG CANCER SCREENING
TECHNIQUE: Multidetector CT imaging of the chest was performed following the
standard protocol without IV contrast.

[ct lung segmentation data · axial · 0.58mm/px · z∈[-594,-594]mm · 10 of 306 frames shown]
[frame 1/306  mediastinal]
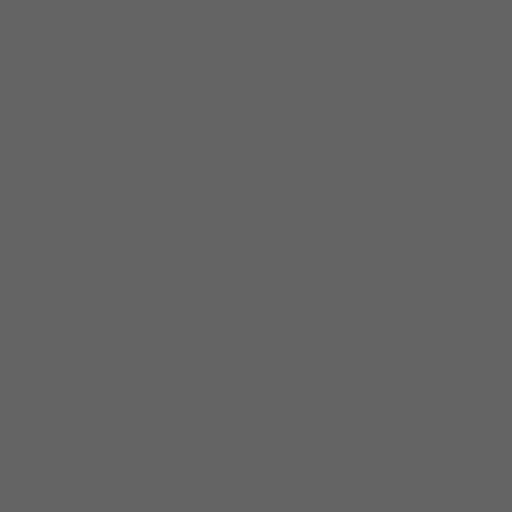
[frame 1/306  lung]
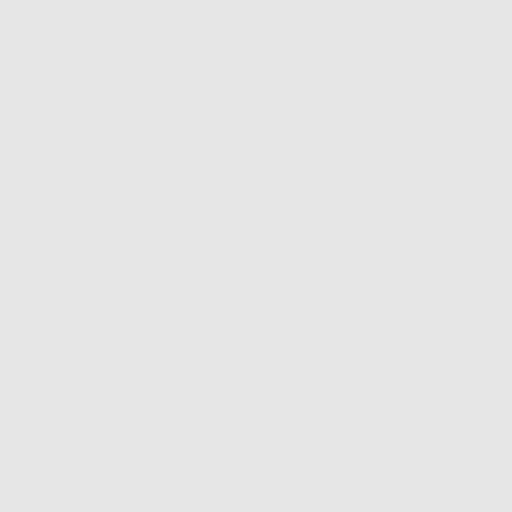
[frame 34/306  lung]
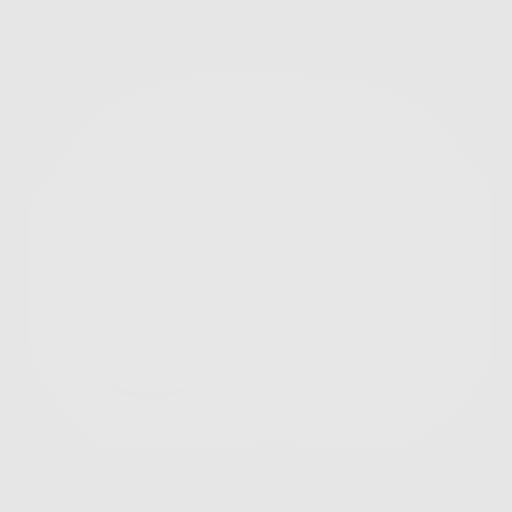
[frame 68/306  lung]
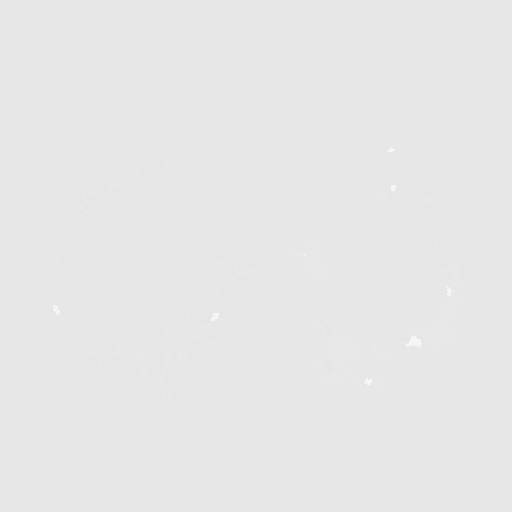
[frame 102/306  lung]
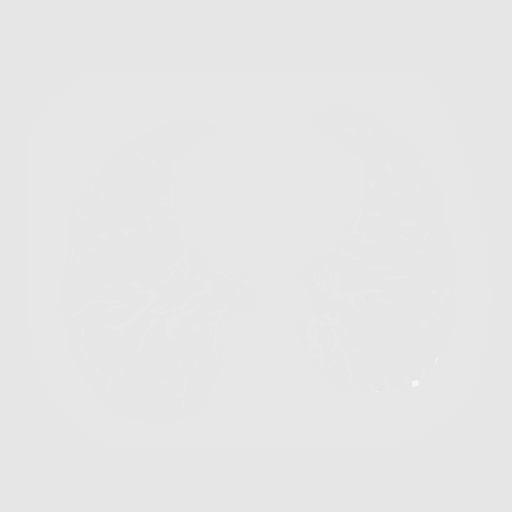
[frame 136/306  mediastinal]
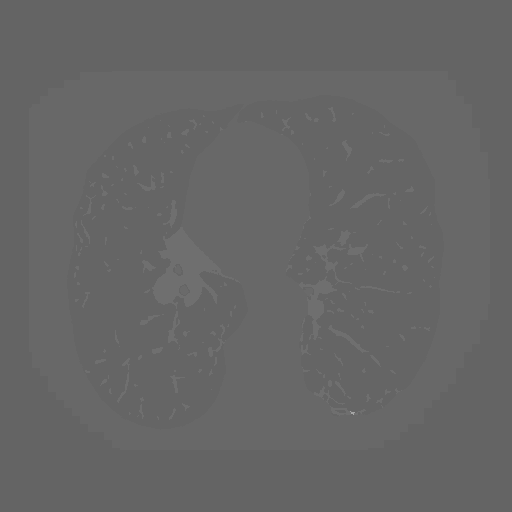
[frame 136/306  lung]
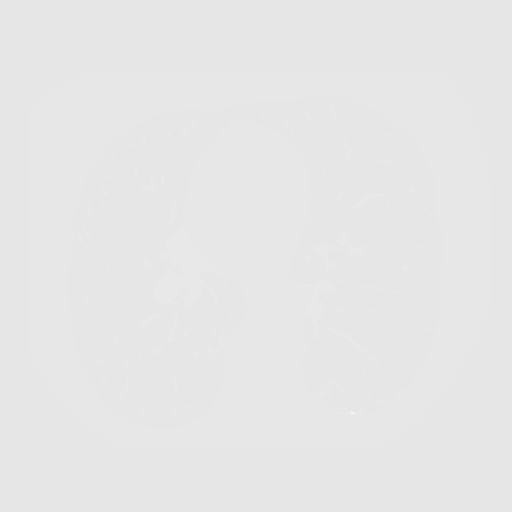
[frame 170/306  lung]
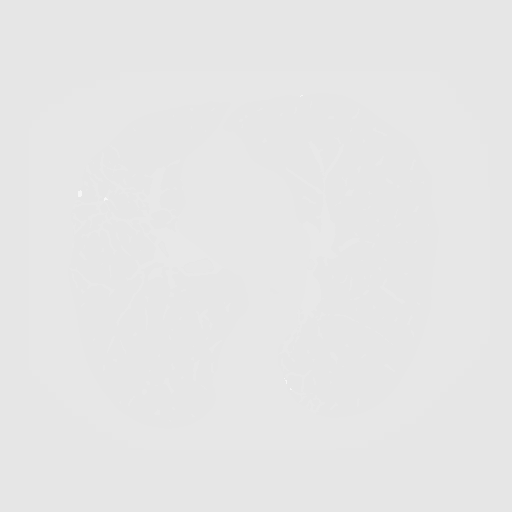
[frame 204/306  lung]
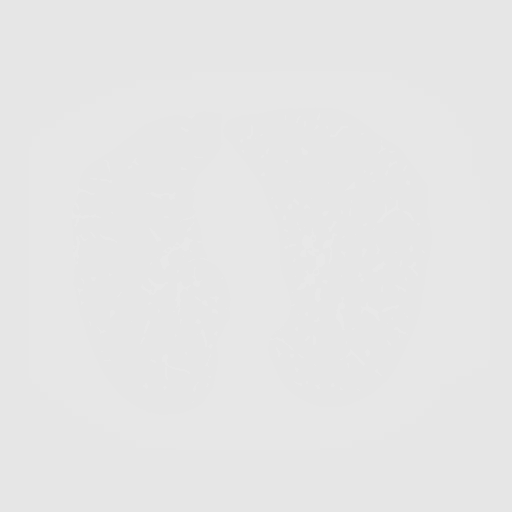
[frame 238/306  lung]
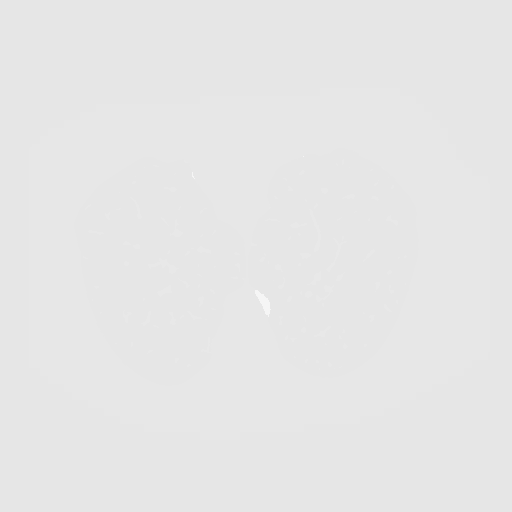
[frame 272/306  mediastinal]
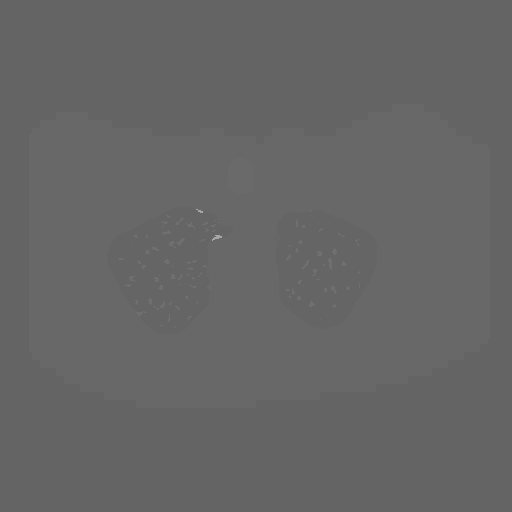
[frame 272/306  lung]
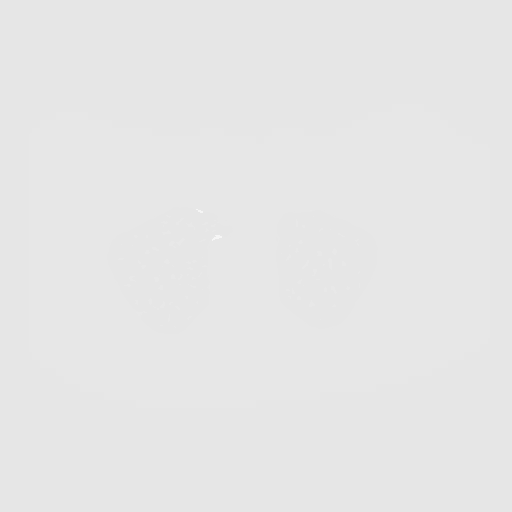
[frame 306/306  lung]
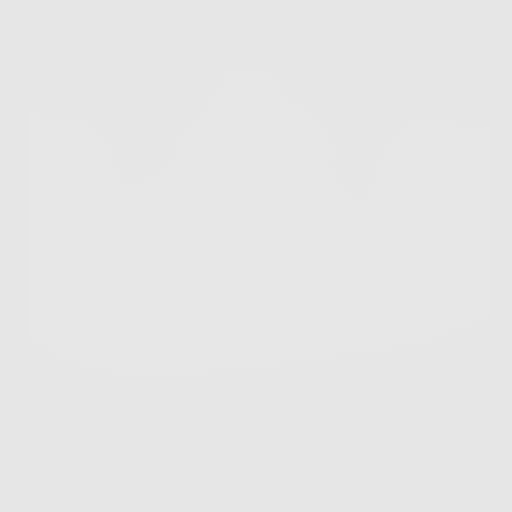

[10 of 40 positions shown; findings below may reference images not displayed]

FINDINGS: Mediastinum/Nodes: Normal heart size. No pericardial
fluid/thickening. Left anterior descending coronary atherosclerosis.
Atherosclerotic nonaneurysmal thoracic aorta. Normal caliber
pulmonary arteries. Stable atrophic appearing thyroid gland. Normal
visualized thyroid. Normal esophagus. No pathologically enlarged
axillary, mediastinal or gross hilar lymph nodes, noting limited
sensitivity for the detection of hilar adenopathy on this
noncontrast study.

Lungs/Pleura: No pneumothorax. Trace layering left pleural effusion.
No right pleural effusion. Moderate centrilobular emphysema and
diffuse bronchial wall thickening. There is new patchy consolidation
and ground-glass opacity with air bronchograms in the basilar left
lower lobe. There is a new irregular focus of consolidation in the
peripheral right upper lobe measuring 3.8 x 3.2 cm (series 3/ image
130). Previously described peripheral right middle lobe pulmonary
nodule measures 5.0 mm in volume derived mean diameter (series 3/
image 228), decreased in size.

Upper abdomen: Unremarkable.

Musculoskeletal: No aggressive appearing focal osseous lesions.
Moderate degenerative changes in the thoracic spine.
IMPRESSION: 1. New prominent patchy consolidation and ground-glass opacity with
air bronchograms in the basilar left lower lobe. New irregular focus
of consolidation in the peripheral right upper lobe. Multilobar
pneumonia is suspected.
2. Lung-RADS Category 0S, incomplete. Short-term follow-up is
recommended in 2 months with repeat low-dose chest CT without
contrast (please use the following order, "CT CHEST LCS NODULE
FOLLOW-UP W/O CM").
3. The "S" modifier above refers to potentially clinically
significant non lung cancer related findings. Specifically, 1 vessel
coronary atherosclerosis.
4. Trace layering left pleural effusion.
5. Moderate centrilobular emphysema and diffuse bronchial wall
thickening, suggesting COPD.

These results will be called to the ordering clinician or
representative by the Radiologist Assistant, and communication
documented in the PACS or zVision Dashboard.

## 2017-05-18 ENCOUNTER — Telehealth: Payer: Self-pay | Admitting: Internal Medicine

## 2017-05-18 NOTE — Telephone Encounter (Signed)
Patient calling in regards to follow up appt letter She does not want to schedule at this time Would like to wait until July

## 2017-06-06 ENCOUNTER — Other Ambulatory Visit
Admission: RE | Admit: 2017-06-06 | Discharge: 2017-06-06 | Disposition: A | Discharge status: Home / Self Care | Payer: Medicare Other | Source: Ambulatory Visit | Attending: Student | Admitting: Student

## 2017-06-06 DIAGNOSIS — K5190 Ulcerative colitis, unspecified, without complications: Secondary | ICD-10-CM | POA: Insufficient documentation

## 2017-06-06 LAB — GASTROINTESTINAL PANEL BY PCR, STOOL (REPLACES STOOL CULTURE)
ASTROVIRUS: NOT DETECTED
Adenovirus F40/41: NOT DETECTED
Campylobacter species: NOT DETECTED
Cryptosporidium: NOT DETECTED
Cyclospora cayetanensis: NOT DETECTED
ENTAMOEBA HISTOLYTICA: NOT DETECTED
ENTEROAGGREGATIVE E COLI (EAEC): NOT DETECTED
ENTEROPATHOGENIC E COLI (EPEC): NOT DETECTED
ENTEROTOXIGENIC E COLI (ETEC): NOT DETECTED
GIARDIA LAMBLIA: NOT DETECTED
NOROVIRUS GI/GII: NOT DETECTED
Plesimonas shigelloides: NOT DETECTED
Rotavirus A: NOT DETECTED
SHIGA LIKE TOXIN PRODUCING E COLI (STEC): NOT DETECTED
Salmonella species: NOT DETECTED
Sapovirus (I, II, IV, and V): NOT DETECTED
Shigella/Enteroinvasive E coli (EIEC): NOT DETECTED
Vibrio cholerae: NOT DETECTED
Vibrio species: NOT DETECTED
Yersinia enterocolitica: NOT DETECTED

## 2017-06-06 LAB — CLOSTRIDIUM DIFFICILE BY PCR, REFLEXED: CDIFFPCR: POSITIVE — AB

## 2017-06-06 LAB — C DIFFICILE QUICK SCREEN W PCR REFLEX
C DIFFICILE (CDIFF) TOXIN: NEGATIVE
C DIFFICLE (CDIFF) ANTIGEN: POSITIVE — AB

## 2017-06-08 LAB — CALPROTECTIN, FECAL: CALPROTECTIN, FECAL: 2310 ug/g — AB (ref 0–120)

## 2017-06-26 ENCOUNTER — Other Ambulatory Visit
Admission: RE | Admit: 2017-06-26 | Discharge: 2017-06-26 | Disposition: A | Discharge status: Home / Self Care | Payer: Medicare Other | Source: Ambulatory Visit | Attending: Unknown Physician Specialty | Admitting: Unknown Physician Specialty

## 2017-06-26 DIAGNOSIS — A0472 Enterocolitis due to Clostridium difficile, not specified as recurrent: Secondary | ICD-10-CM | POA: Diagnosis present

## 2017-06-26 LAB — GASTROINTESTINAL PANEL BY PCR, STOOL (REPLACES STOOL CULTURE)
ASTROVIRUS: NOT DETECTED
Adenovirus F40/41: NOT DETECTED
Campylobacter species: NOT DETECTED
Cryptosporidium: NOT DETECTED
Cyclospora cayetanensis: NOT DETECTED
ENTEROAGGREGATIVE E COLI (EAEC): NOT DETECTED
Entamoeba histolytica: NOT DETECTED
Enteropathogenic E coli (EPEC): NOT DETECTED
Enterotoxigenic E coli (ETEC): NOT DETECTED
GIARDIA LAMBLIA: NOT DETECTED
NOROVIRUS GI/GII: NOT DETECTED
Plesimonas shigelloides: NOT DETECTED
Rotavirus A: NOT DETECTED
SALMONELLA SPECIES: NOT DETECTED
Sapovirus (I, II, IV, and V): NOT DETECTED
Shiga like toxin producing E coli (STEC): NOT DETECTED
Shigella/Enteroinvasive E coli (EIEC): NOT DETECTED
VIBRIO CHOLERAE: NOT DETECTED
Vibrio species: NOT DETECTED
Yersinia enterocolitica: NOT DETECTED

## 2017-06-26 LAB — C DIFFICILE QUICK SCREEN W PCR REFLEX
C DIFFICILE (CDIFF) TOXIN: NEGATIVE
C DIFFICLE (CDIFF) ANTIGEN: NEGATIVE
C Diff interpretation: NOT DETECTED

## 2017-07-26 ENCOUNTER — Telehealth: Payer: Self-pay | Admitting: *Deleted

## 2017-07-26 NOTE — Telephone Encounter (Signed)
Left message for patient to notify them that it is time to schedule annual low dose lung cancer screening CT scan. Instructed patient to call back to verify information prior to the scan being scheduled.  

## 2017-07-27 ENCOUNTER — Telehealth: Payer: Self-pay | Admitting: *Deleted

## 2017-07-27 DIAGNOSIS — Z122 Encounter for screening for malignant neoplasm of respiratory organs: Secondary | ICD-10-CM

## 2017-07-27 DIAGNOSIS — Z87891 Personal history of nicotine dependence: Secondary | ICD-10-CM

## 2017-07-27 NOTE — Telephone Encounter (Signed)
Notified patient that annual lung cancer screening low dose CT scan is due currently or will be in near future. Confirmed that patient is within the age range of 55-77, and asymptomatic, (no signs or symptoms of lung cancer). Patient denies illness that would prevent curative treatment for lung cancer if found. Verified smoking history, (former, quit 03/2014). The shared decision making visit was done 10/23/13. Patient is agreeable for CT scan being scheduled.

## 2017-08-03 ENCOUNTER — Ambulatory Visit
Admission: RE | Admit: 2017-08-03 | Discharge: 2017-08-03 | Disposition: A | Discharge status: Home / Self Care | Payer: Medicare Other | Source: Ambulatory Visit | Attending: Nurse Practitioner | Admitting: Nurse Practitioner

## 2017-08-03 DIAGNOSIS — I700 Atherosclerosis of aorta: Secondary | ICD-10-CM | POA: Insufficient documentation

## 2017-08-03 DIAGNOSIS — Z122 Encounter for screening for malignant neoplasm of respiratory organs: Secondary | ICD-10-CM

## 2017-08-03 DIAGNOSIS — J432 Centrilobular emphysema: Secondary | ICD-10-CM | POA: Diagnosis not present

## 2017-08-03 DIAGNOSIS — Z87891 Personal history of nicotine dependence: Secondary | ICD-10-CM | POA: Diagnosis not present

## 2017-08-06 ENCOUNTER — Encounter: Payer: Self-pay | Admitting: *Deleted

## 2017-11-19 ENCOUNTER — Ambulatory Visit (INDEPENDENT_AMBULATORY_CARE_PROVIDER_SITE_OTHER): Payer: Medicare Other | Admitting: Internal Medicine

## 2017-11-19 ENCOUNTER — Encounter: Payer: Self-pay | Admitting: Internal Medicine

## 2017-11-19 VITALS — BP 116/80 | HR 65 | Ht 61.0 in | Wt 118.0 lb

## 2017-11-19 DIAGNOSIS — J449 Chronic obstructive pulmonary disease, unspecified: Secondary | ICD-10-CM | POA: Diagnosis not present

## 2017-11-19 NOTE — Patient Instructions (Addendum)
wear oxygen as prescribed continue advair as prescribed  Recheck 6 minute walk test

## 2017-11-19 NOTE — Progress Notes (Signed)
[facility] Consultation      Date: 11/19/2017,   MRN# [id number] Sheila Peters [birth year]    AdmissionWeight: 118 lb (53.5 kg)                 CurrentWeight: 118 lb (53.5 kg) Sheila Peters is a 77 y.o. old female seen in consultation for abnormal Ct chest at the request of Dr. Ginette Pitman     CHIEF COMPLAINT:   Abnormal CT chest Follow up COPD   PREVIOUS HPI  77 yo white female abnormal CT chest, patient has h/o COPD Patient has had previous low dose lung cancer screening CT last year shows 5 MM RML nodule Patient has follow up CT chest in July 2017 which showed new LLL opacity and RUL nodular opacity and the previous 5 MM nodule has decreased in size-current CT chest 01/14/16  shows much improvement of LLL and RUL opacity  CT chest 08/03/16 shows resolution of RML nodule and also stable LLL opacity c/w scarring   HPI she has no acute symptoms at this time Denies fevers, chills, night sweats, NVD. No cough no wheezing and no SOB, but has DOE that seems to be at baseline  Patient has h/o UC and was treated heavy dose of prednisone several months ago  No signs of infection at this time   At this time patient is only using Advair at this time and it is helping her symptoms She would like to try once daily dosing She really does NOT like to wear and use her oxygen but I have stated that she needs it and I have explained how important it is to use and wear her oxygen  PFT's 01/14/16 Fev1 49% Fev1 0.97 L  ratio 49% DLCO 49%  CT chest 4/18 RML nodule approx 5MM resolved since last CT Left Lower lobe residual opacity-likely scar tissue-no change in size since last CT chest PFT's 01/14/16    Current Medication:  Current Outpatient Medications:  .  acetaminophen (TYLENOL) 500 MG tablet, Take 500 mg by mouth every 6 (six) hours as needed., Disp: , Rfl:  .  brimonidine-timolol (COMBIGAN) 0.2-0.5 % ophthalmic solution, Place 1 drop into both eyes every 12  (twelve) hours., Disp: , Rfl:  .  calcium carbonate (TUMS EX) 750 MG chewable tablet, Chew 1 tablet by mouth daily., Disp: , Rfl:  .  cholecalciferol (VITAMIN D) 400 units TABS tablet, Take 400 Units by mouth., Disp: , Rfl:  .  fluticasone (FLONASE) 50 MCG/ACT nasal spray, Place 2 sprays into both nostrils daily., Disp: , Rfl:  .  Fluticasone-Salmeterol (ADVAIR) 250-50 MCG/DOSE AEPB, Inhale 1 puff into the lungs 2 (two) times daily., Disp: , Rfl:  .  inFLIXimab (REMICADE) 100 MG injection, Inject 100 mg into the vein as directed., Disp: , Rfl:  .  Levothyroxine Sodium (SYNTHROID PO), Take 50 mg by mouth daily. , Disp: , Rfl:  .  Probiotic Product (PROBIOTIC ADVANCED PO), Take 1 capsule by mouth daily., Disp: , Rfl:  .  ranitidine (ZANTAC) 150 MG tablet, Take 150 mg by mouth daily., Disp: , Rfl:  .  timolol (TIMOPTIC) 0.5 % ophthalmic solution, Place 1 drop into both eyes 2 (two) times daily., Disp: , Rfl:  .  tiZANidine (ZANAFLEX) 4 MG tablet, Take 4 mg by mouth 3 (three) times daily., Disp: , Rfl:  .  VENTOLIN HFA 108 (90 Base) MCG/ACT inhaler, , Disp: , Rfl: 4 .  vitamin B-12 (CYANOCOBALAMIN) 500 MCG tablet, Take 500  mcg by mouth daily., Disp: , Rfl:     ALLERGIES   Augmentin [amoxicillin-pot clavulanate]; Codeine; Ibuprofen; Iodine; Levaquin [levofloxacin in d5w]; and Morphine and related     REVIEW OF SYSTEMS   Review of Systems  Constitutional: Negative for chills, diaphoresis, fever, malaise/fatigue and weight loss.  HENT: Negative for congestion.   Respiratory: Positive for shortness of breath. Negative for cough, hemoptysis, sputum production and wheezing.   Cardiovascular: Negative for chest pain, palpitations, orthopnea and leg swelling.  Gastrointestinal: Negative for abdominal pain, heartburn, nausea and vomiting.  Musculoskeletal: Negative for myalgias.  Neurological: Negative for weakness.  All other systems reviewed and are negative.   BP 116/80 (BP Location: Left  Arm, Cuff Size: Normal)   Pulse 65   Ht 5' 1"  (1.549 m)   Wt 118 lb (53.5 kg)   SpO2 96%   BMI 22.30 kg/m     PHYSICAL EXAM  Physical Exam  Constitutional: She is oriented to person, place, and time. She appears well-developed and well-nourished. No distress.  HENT:  Mouth/Throat: No oropharyngeal exudate.  Cardiovascular: Normal rate, regular rhythm and normal heart sounds.  No murmur heard. Pulmonary/Chest: Effort normal and breath sounds normal. No stridor. No respiratory distress. She has no wheezes. She has no rales.  Musculoskeletal: Normal range of motion. She exhibits no edema.  Neurological: She is alert and oriented to person, place, and time. No cranial nerve deficit.  Skin: Skin is warm. She is not diaphoretic.  Psychiatric: She has a normal mood and affect.       IMAGING   Compared to CT chest 01/14/16 RML nodule approx 5MM resolved  Left Lower lobe residual opacity-likely scar tissue/rounded atelactasis   CT chest June 2017 RML decreased in size from 5 mm. RUL nodular opacity LLL opacity with air bronchogram signs  CT chest July 2016 RML nodule approx 5 mm  ASSESSMENT/PLAN   77 yo white female former smoker with h/o moderate/severe COPD gold stage A with abnormal CT chest with stable/decreased RML 5 MM nodule with resolving  LLL opacity now with left lower lobe scar/rounded atelectasis    #1 shortness of breath and dyspnea exertion related to COPD and deconditioned state Will need to re-assess for hypoxia will check for 6 minute walk test   #2 COPD moderate to severe Gold stage A Will continue to prescribed Advair-she would like to change to once daily dosing Albuterol as needed   #3 deconditioned state Recommend exercise as tolerated  #4 abnormal CT scan Left lower lobe residual scar tissue in rounded atelectasis consider repeat CT chest in the next 6 months   Follow up in 6 months Patient  satisfied with Plan of action and management. All  questions answered  Corrin Parker, M.D.  [facility]  Medical Director [place] Director [facility]

## 2017-11-20 ENCOUNTER — Telehealth: Payer: Self-pay | Admitting: Internal Medicine

## 2017-11-20 NOTE — Telephone Encounter (Signed)
Returned call to [employer]. Made aware CMN received this am. DK will be back in clinic 11/22/17. Copy of office note faxed. Nothing further needed.

## 2017-11-20 NOTE — Telephone Encounter (Signed)
Diane from [employer] to check on status of forms faxed, the Certificate of Medical Necessity  Please complete ASAP and call Diane with any questions or concerns

## 2017-11-28 ENCOUNTER — Ambulatory Visit (INDEPENDENT_AMBULATORY_CARE_PROVIDER_SITE_OTHER): Payer: Medicare Other | Admitting: *Deleted

## 2017-11-28 DIAGNOSIS — J449 Chronic obstructive pulmonary disease, unspecified: Secondary | ICD-10-CM

## 2017-11-28 NOTE — Progress Notes (Signed)
SIX MIN WALK 11/28/2017 11/22/2016 01/14/2016  Medications Advair, Levothyroxine Hair, skin & nails vitamin, Advair, Levothyroxine, Probiotic Prednisone, Levotyhroxine, Immodium, Benadryl  Supplimental Oxygen during Test? (L/min) No No No  Laps 2 3 5   Partial Lap (in Meters) 0 24 21  Baseline BP (sitting) 138/86 130/80 118/66  Baseline Heartrate 77 80 100  Baseline Dyspnea (Borg Scale) 2 2 2   Baseline Fatigue (Borg Scale) 0 0 2  Baseline SPO2 93 95 90  BP (sitting) 158/78 170/70 146/74  Heartrate 104 101 107  Dyspnea (Borg Scale) 5 7 3   Fatigue (Borg Scale) 1 3 3   SPO2 85 82 90  BP (sitting) 150/80 148/80 126/62  Heartrate 81 75 98  SPO2 97 95 94  Stopped or Paused before Six Minutes Yes Yes No  Other Symptoms at end of Exercise stopped due to SOB and chest tightness. pt walked 2 min and 45 secs O2 sats dropped -  Distance Completed 96 168 261  Tech Comments: - pt walked at slow pace with sandals. Pt wore dlip flops which caused pt to walk slower.

## 2018-01-12 ENCOUNTER — Other Ambulatory Visit
Admission: RE | Admit: 2018-01-12 | Discharge: 2018-01-12 | Disposition: A | Discharge status: Home / Self Care | Payer: Medicare Other | Source: Ambulatory Visit | Attending: Student | Admitting: Student

## 2018-01-12 DIAGNOSIS — Z8619 Personal history of other infectious and parasitic diseases: Secondary | ICD-10-CM | POA: Diagnosis present

## 2018-01-12 LAB — C DIFFICILE QUICK SCREEN W PCR REFLEX
C Diff antigen: NEGATIVE
C Diff interpretation: NOT DETECTED
C Diff toxin: NEGATIVE

## 2018-02-21 ENCOUNTER — Other Ambulatory Visit
Admission: RE | Admit: 2018-02-21 | Discharge: 2018-02-21 | Disposition: A | Discharge status: Home / Self Care | Payer: Medicare Other | Source: Ambulatory Visit | Attending: Student | Admitting: Student

## 2018-02-21 DIAGNOSIS — K5100 Ulcerative (chronic) pancolitis without complications: Secondary | ICD-10-CM | POA: Insufficient documentation

## 2018-02-21 LAB — C DIFFICILE QUICK SCREEN W PCR REFLEX
C Diff antigen: POSITIVE — AB
C Diff toxin: NEGATIVE

## 2018-02-21 LAB — GASTROINTESTINAL PANEL BY PCR, STOOL (REPLACES STOOL CULTURE)
ADENOVIRUS F40/41: NOT DETECTED
Astrovirus: NOT DETECTED
CRYPTOSPORIDIUM: NOT DETECTED
Campylobacter species: NOT DETECTED
Cyclospora cayetanensis: NOT DETECTED
ENTEROAGGREGATIVE E COLI (EAEC): NOT DETECTED
ENTEROPATHOGENIC E COLI (EPEC): NOT DETECTED
Entamoeba histolytica: NOT DETECTED
Enterotoxigenic E coli (ETEC): NOT DETECTED
GIARDIA LAMBLIA: NOT DETECTED
Norovirus GI/GII: NOT DETECTED
Plesimonas shigelloides: NOT DETECTED
Rotavirus A: NOT DETECTED
Salmonella species: NOT DETECTED
Sapovirus (I, II, IV, and V): NOT DETECTED
Shiga like toxin producing E coli (STEC): NOT DETECTED
Shigella/Enteroinvasive E coli (EIEC): NOT DETECTED
VIBRIO SPECIES: NOT DETECTED
Vibrio cholerae: NOT DETECTED
YERSINIA ENTEROCOLITICA: NOT DETECTED

## 2018-02-21 LAB — CLOSTRIDIUM DIFFICILE BY PCR, REFLEXED: Toxigenic C. Difficile by PCR: POSITIVE — AB

## 2018-02-22 LAB — CALPROTECTIN, FECAL: Calprotectin, Fecal: 438 ug/g — ABNORMAL HIGH (ref 0–120)

## 2018-03-26 ENCOUNTER — Other Ambulatory Visit
Admission: RE | Admit: 2018-03-26 | Discharge: 2018-03-26 | Disposition: A | Discharge status: Home / Self Care | Payer: Medicare Other | Source: Ambulatory Visit | Attending: Student | Admitting: Student

## 2018-03-26 DIAGNOSIS — K5100 Ulcerative (chronic) pancolitis without complications: Secondary | ICD-10-CM | POA: Insufficient documentation

## 2018-03-26 LAB — C DIFFICILE QUICK SCREEN W PCR REFLEX
C DIFFICLE (CDIFF) ANTIGEN: NEGATIVE
C Diff interpretation: NOT DETECTED
C Diff toxin: NEGATIVE

## 2018-03-28 ENCOUNTER — Other Ambulatory Visit: Payer: Self-pay

## 2018-03-28 ENCOUNTER — Ambulatory Visit: Payer: Medicare Other | Attending: Student

## 2018-03-28 DIAGNOSIS — R278 Other lack of coordination: Secondary | ICD-10-CM | POA: Diagnosis present

## 2018-03-28 DIAGNOSIS — M6281 Muscle weakness (generalized): Secondary | ICD-10-CM | POA: Insufficient documentation

## 2018-03-28 DIAGNOSIS — M533 Sacrococcygeal disorders, not elsewhere classified: Secondary | ICD-10-CM | POA: Insufficient documentation

## 2018-03-28 DIAGNOSIS — R293 Abnormal posture: Secondary | ICD-10-CM | POA: Insufficient documentation

## 2018-03-28 DIAGNOSIS — M62838 Other muscle spasm: Secondary | ICD-10-CM | POA: Diagnosis present

## 2018-03-28 NOTE — Therapy (Addendum)
[place] MAIN [facility] SERVICES [address] [place], [place], [address] Phone: [phone]   Fax:  [phone]  Physical Therapy Evaluation  Patient Details  Name: Sheila Peters MRN: [id number] Date of Birth: 1940-08-18 Referring Provider (PT): Tammi Klippel   Encounter Date: 03/28/2018  PT End of Session - 03/30/18 1829    Visit Number  1    Number of Visits  10    Date for PT Re-Evaluation  06/07/18    Authorization Type  Medicare    Authorization - Visit Number  1    Authorization - Number of Visits  10    PT Start Time  6712    PT Stop Time  1535    PT Time Calculation (min)  60 min    Activity Tolerance  Patient tolerated treatment well    Behavior During Therapy  [facility] for tasks assessed/performed       Past Medical History:  Diagnosis Date  . Arthritis   . Asthma   . Clostridium difficile diarrhea   . Colitis, nonspecific   . COPD (chronic obstructive pulmonary disease) ([place])   . GERD (gastroesophageal reflux disease)   . Hypothyroidism    post-surgical  . Multiple thyroid nodules    post excision  . Nephrolithiasis    spontaneously passed  . Nephrolithiasis   . Non-toxic uninodular goiter   . Osteoporosis, postmenopausal   . Ulcerative colitis, chronic ([place])   . 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 ABDOMINAL HYSTERECTOMY    . ANKLE SURGERY     left fracture  . BACK SURGERY     herniated disk  . COLONOSCOPY N/A 02/26/2017   Procedure: COLONOSCOPY;  Surgeon: Manya Silvas, MD;  Location[facility] ENDOSCOPY;  Service: Endoscopy;  Laterality: N/A;  . COLONOSCOPY WITH PROPOFOL N/A 11/02/2014   Procedure: COLONOSCOPY WITH PROPOFOL;  Surgeon: Josefine Class, MD;  Location: [facility] ENDOSCOPY;  Service: Endoscopy;  Laterality: N/A;  . excision of thyroid nodule    . 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 JOINT REPLACEMENT     2014 lth    There were no vitals filed for this visit.       [facility] PT Assessment - 03/30/18 0001      Assessment   Medical Diagnosis  Fecal Incontinence    Referring Provider (PT)  Tammi Klippel    Onset Date/Surgical Date  04/28/16    Next MD Visit  07-12-18    Prior Therapy  none      Precautions   Precautions  None      Restrictions   Weight Bearing Restrictions  No      Balance Screen   Has the patient fallen in the past 6 months  No      [place] residence    Living Arrangements  Alone    Available Help at Discharge  Family;Available PRN/intermittently    Type of Home  House    Home Access  Stairs to enter    Entrance Stairs-Number of Steps  4    Entrance Stairs-Rails  Right;Left    Home Layout  One level      Prior Function   Level of Independence  Independent with basic ADLs   may ask for  help with moving something heavy or awkwards   Vocation  Retired    [employer]  --   [employer]    Leisure  read, watch TV, visit with family and friends      Cognition   Overall Cognitive Status  Within Functional Limits for tasks assessed    Attention  Focused        Pelvic Floor Physical Therapy Evaluation and Assessment  SCREENING  Falls in last 6 mo: no  Red Flags:  Have you had any night sweats? no Unexplained weight loss? no Saddle anesthesia? none Unexplained changes in bowel or bladder habits? no  SUBJECTIVE  Patient reports: She had a colonoscopy to try to assess prior to fecal transplant and her FI has worsened since (11/18). this was to try to help with frequent C-diff infections.  It has been worse since June 2019, she is not able to go to church anymore of it. Is wearing overnight incontinence pads. Has had urinary incontinence since birth of her children, had a bladder tack when  hysterectomy performed and it did not last.  Precautions:  Osteoporosis, Skin CA  Social/Family/Vocational History:   Retired  Recent Procedures/Tests/Findings:  Colonoscopy, low back surgery, hysterectomy, failed bladder tack, L hip replacement.  Obstetrical History: 2 vaginal deliveries, tearing/cut with both  Gynecological History: Was having heavy bleeding leading to hysterectomy  Urinary History: Had a bladder tack and her catheter became blocked and to go to the ER.   Has mild SUI with stress.   Gastrointestinal History: Has had a history of frequent c-diff infections and then had a fecal transplant and has been having worse FI since  Sexual activity/pain: Had pain, her husband has passed 3 years prior.  Location of pain:  Arthritis pain in hands and feet.  Patient Goals: To be able to go onto a trip without having to have worry about incontinence on her mid 24 hrs. Per day.   OBJECTIVE  Posture/Observations:  Sitting: forward slump Standing: L PSIS high, L ASIS high ( anterior rotation) R Thoracic scoliosis   Palpation/Segmental Motion/Joint Play:  Deferred to following visit  Special tests:   Stork:  Decreased stability on L  Range of Motion/Flexibilty:  Deferred to following visit Spine: Hips:   Strength/MMT:  Deferred to following visit  LE MMT  LE MMT Left Right  Hip flex:  (L2) /5 /5  Hip ext: /5 /5  Hip abd: /5 /5  Hip add: /5 /5  Hip IR /5 /5  Hip ER /5 /5     Abdominal:   Deferred to following visit  Palpation: Diastasis:  Pelvic Floor External Exam:  Deferred to following visit  Introitus Appears:  Skin integrity:  Palpation: Cough: Prolapse visible?: Scar mobility:  Internal Vaginal Exam: Deferred to following visit Strength (PERF):  Symmetry: Palpation: Prolapse:   Internal Rectal Exam: Deferred until deemed necessary Strength (PERF): Symmetry: Palpation: Prolapse:   Gait Analysis: not  assessed   INTERVENTIONS THIS SESSION: Self-care: Educated on the structure and function of the pelvic floor in relation to their symptoms as well as the POC, and initial HEP in order to set patient expectations and understanding from which we will build on in the future sessions.   Total time:           Objective measurements completed on examination: See above findings.              PT Education - 03/30/18 1840    Education Details  See Interventions this session.    Person(s) Educated  Patient    Methods  Explanation    Comprehension  Verbalized understanding       PT Short Term Goals - 03/30/18 1815      PT SHORT TERM GOAL #1   Title  Patient will demonstrate a coordinated contraction, relaxation, and bulge of the pelvic floor muscles to demonstrate functional recruitment and motion and allow for further strengthening.    Baseline  History suggests poor PFM coordination    Time  5    Period  Weeks    Status  New    Target Date  05/04/18      PT SHORT TERM GOAL #2   Title  Patient will demonstrate improved pelvic alignment and balance of musculature surrounding the pelvis to facilitate decreased PFM spasms and decrease pelvic pain.    Baseline  Pt. has moderate scoliosis that is impacting her pelvic alignment and encouraging spasms surrounding the pelvis.     Time  5    Period  Weeks    Status  New    Target Date  05/04/18      PT SHORT TERM GOAL #3   Title  Patient will demonstrate HEP x1 in the clinic to demonstrate understanding and proper form to allow for further improvement.    Baseline  Pt. lacks knowledge of apprpriate exercises to improve FI and SUI        PT Long Term Goals - 03/29/18 1425      PT LONG TERM GOAL #1   Title  Patient will score at or below 53/300 on the PFDI and 60/300 on the PFIQ to demonstrate a clinically meaningful decrease in disability and distress due to pelvic floor dysfunction.    Baseline  PFDI: 98/300, PFIQ:  105/300    Time  10    Period  Weeks    Status  New    Target Date  06/07/18      PT LONG TERM GOAL #2   Title  Patient will score at or below 20/61 of the FISI to demonstrate decreased fecal incontinence and improved quality of life.    Baseline  FISI: 39/61    Time  10    Period  Weeks    Status  New    Target Date  06/07/18      PT LONG TERM GOAL #3   Title  Patient will report no episodes of SUI over the course of the prior two weeks to demonstrate improved functional ability.    Baseline  SUI requirng panty-liner daily    Time  10    Period  Weeks    Status  New    Target Date  06/21/18      PT LONG TERM GOAL #4   Title  Pt. will have the confidence to go on a shopping trip or go to church requiring her to be away from the house for > 2 hours to demonstrate improved participation and quality of life.    Baseline  has greatly reduced her participation due to embarassment and fear of FI.    Time  10    Period  Weeks    Status  New    Target Date  06/07/18             Plan - 03/30/18 1830    Clinical Impression Statement  Pt. is a 77 y/o female who presents today with chief c/o fecal incontinence which has  worsened over the past year to the point that she is afraid to leave the house for more than an hour. Her PMH is extensive and includes 2 vaginal deliveries, frequent c-diff infections, polyps, low back surgery, L hip replacement, hysterectomy, and failed bladder tack, and osteoporosis among others. Her clinical exam revealed moderate R thoracic scoliosis, poor pelvic alignment, spasms surrounding the pelvis and low back as well as decreased balance and instability at L SIJ. Her history suggests poor PFM strength and coordination as well as some spasms, internal exam will be performed at follow-up visit due to time and complexity of history. She will benefit from skilled pelvic floor PT to address noted defecits and to continue to assess for and address other potential  causes of incontinence.    Clinical Presentation  Evolving    Clinical Presentation due to:  complexity of medical history and time restraint.    Clinical Decision Making  High    Rehab Potential  Fair    Clinical Impairments Affecting Rehab Potential  complex medical history, age, scoliosis, osteoporosis    PT Frequency  1x / week    PT Duration  Other (comment)   10 weeks   PT Treatment/Interventions  ADLs/Self Care Home Management;Biofeedback;Electrical Stimulation;Moist Heat;Gait training;Stair training;Functional mobility training;Therapeutic activities;Therapeutic exercise;Balance training;Neuromuscular re-education;Manual techniques;Scar mobilization;Passive range of motion;Dry needling;Taping;Spinal Manipulations;Joint Manipulations    PT Next Visit Plan  Internal assessment and TP release Vs DN     Consulted and Agree with Plan of Care  Patient       Patient will benefit from skilled therapeutic intervention in order to improve the following deficits and impairments:  Increased fascial restricitons, Improper body mechanics, Decreased coordination, Decreased mobility, Increased muscle spasms, Postural dysfunction, Decreased activity tolerance, Decreased endurance, Decreased range of motion, Decreased strength, Impaired perceived functional ability, Decreased balance  Visit Diagnosis: Muscle weakness (generalized)  Other lack of coordination  Sacrococcygeal disorders, not elsewhere classified  Other muscle spasm  Abnormal posture     Problem List Patient Active Problem List   Diagnosis Date Noted  . Swelling of limb 10/10/2016  . Pain in limb 10/10/2016  . Varicose veins of leg with pain, left 10/10/2016  . Abnormal CT scan of lung 10/29/2015  . Personal history of tobacco use, presenting hazards to health 11/04/2014  . Preop cardiovascular exam 09/11/2011  . SOB (shortness of breath) 08/21/2011  . Chest pain 08/21/2011  . PVC's (premature ventricular contractions)  08/21/2011   Willa Rough DPT, ATC Willa Rough 03/30/2018, 6:41 PM  [place] MAIN [facility] SERVICES [address] [place], [place], [address] Phone: [phone]   Fax:  [phone]  Name: Sheila Peters MRN: [id number] Date of Birth: 02-20-1941

## 2018-03-30 NOTE — Addendum Note (Signed)
Addended by: Letitia Libra T on: 03/30/2018 06:43 PM   Modules accepted: Orders

## 2018-04-01 ENCOUNTER — Ambulatory Visit: Payer: Medicare Other

## 2018-04-01 DIAGNOSIS — R293 Abnormal posture: Secondary | ICD-10-CM

## 2018-04-01 DIAGNOSIS — R278 Other lack of coordination: Secondary | ICD-10-CM

## 2018-04-01 DIAGNOSIS — M6281 Muscle weakness (generalized): Secondary | ICD-10-CM

## 2018-04-01 DIAGNOSIS — M533 Sacrococcygeal disorders, not elsewhere classified: Secondary | ICD-10-CM

## 2018-04-01 DIAGNOSIS — M62838 Other muscle spasm: Secondary | ICD-10-CM

## 2018-04-01 NOTE — Therapy (Signed)
[place] MAIN [facility] SERVICES [address] [place], [place], [address] Phone: [phone]   Fax:  [phone]  Physical Therapy Treatment  Patient Details  Name: Sheila Peters MRN: [id number] Date of Birth: Feb 10, 1941 Referring Provider (PT): Tammi Klippel   Encounter Date: 04/01/2018  PT End of Session - 04/02/18 0929    Visit Number  2    Number of Visits  10    Date for PT Re-Evaluation  06/07/18    Authorization Type  Medicare    Authorization - Visit Number  2    Authorization - Number of Visits  10    PT Start Time  1400    PT Stop Time  1500    PT Time Calculation (min)  60 min    Activity Tolerance  Patient tolerated treatment well    Behavior During Therapy  [facility] for tasks assessed/performed       Past Medical History:  Diagnosis Date  . Arthritis   . Asthma   . Clostridium difficile diarrhea   . Colitis, nonspecific   . COPD (chronic obstructive pulmonary disease) ([place])   . GERD (gastroesophageal reflux disease)   . Hypothyroidism    post-surgical  . Multiple thyroid nodules    post excision  . Nephrolithiasis    spontaneously passed  . Nephrolithiasis   . Non-toxic uninodular goiter   . Osteoporosis, postmenopausal   . Ulcerative colitis, chronic ([place])   . Ulcerative pancolitis without complication [facility])     Past Surgical History:  Procedure Laterality Date  . ABDOMINAL HYSTERECTOMY    . ANKLE SURGERY     left fracture  . BACK SURGERY     herniated disk  . COLONOSCOPY N/A 02/26/2017   Procedure: COLONOSCOPY;  Surgeon: Manya Silvas, MD;  Location: [facility] ENDOSCOPY;  Service: Endoscopy;  Laterality: N/A;  . COLONOSCOPY WITH PROPOFOL N/A 11/02/2014   Procedure: COLONOSCOPY WITH PROPOFOL;  Surgeon: Josefine Class, MD;  Location: [facility] ENDOSCOPY;  Service: Endoscopy;  Laterality: N/A;  . excision of thyroid nodule    . 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 JOINT REPLACEMENT     2014 lth    There were no vitals filed for this visit.    Pelvic Floor Physical Therapy Treatment Note  SCREENING  Changes in medications, allergies, or medical history?: started taking pepcid     SUBJECTIVE  Patient reports: She ate a banana for lunch today.  Precautions:  Osteoporosis, Skin CA  Pain update: Pt. Makes no c/o pain today  Patient Goals: To be able to go onto a trip without having to have worry about incontinence on her mid 24 hrs. Per day.    OBJECTIVE  Changes in:  Pelvic Floor External Exam:  Introitus Appears: normal Skin integrity: age-related atrophy Palpation: TTP to all on L, mild to R STP Cough: weakly intact Prolapse visible?: no Scar mobility: not assessed  Internal Vaginal Exam:  Strength (PERF): 3/5, 1 second Symmetry: L>R for spasms and pain. Palpation: TTP throughout Prolapse: none visualized but Pt unable to bear down while sitting up to exert maximal stress.    INTERVENTIONS THIS SESSION: Manual: Assessed PFM and performed TP release to L OI and PR/PC to decrease pain and spasm and allow for improved recruitment and coordination. NM re-ed: educated on and practiced diaphragmatic breathing with coordination of PFM due  to paradoxical recruitment with breathing. Self-care: educated further on how scoliosis and the trauma of having a hip replacement led to the spasms within her PFM and how breathing and TP release are helping her regain control of her PFM to decrease FI.   Total time: 60 min.                          PT Education - 04/02/18 0929    Education Details  See Pt. Instructions and Interventions this session.    Person(s) Educated  Patient    Methods  Explanation;Tactile cues;Verbal cues;Handout    Comprehension  Verbalized understanding;Returned demonstration;Verbal cues  required;Tactile cues required;Need further instruction       PT Short Term Goals - 03/30/18 1815      PT SHORT TERM GOAL #1   Title  Patient will demonstrate a coordinated contraction, relaxation, and bulge of the pelvic floor muscles to demonstrate functional recruitment and motion and allow for further strengthening.    Baseline  History suggests poor PFM coordination    Time  5    Period  Weeks    Status  New    Target Date  05/04/18      PT SHORT TERM GOAL #2   Title  Patient will demonstrate improved pelvic alignment and balance of musculature surrounding the pelvis to facilitate decreased PFM spasms and decrease pelvic pain.    Baseline  Pt. has moderate scoliosis that is impacting her pelvic alignment and encouraging spasms surrounding the pelvis.     Time  5    Period  Weeks    Status  New    Target Date  05/04/18      PT SHORT TERM GOAL #3   Title  Patient will demonstrate HEP x1 in the clinic to demonstrate understanding and proper form to allow for further improvement.    Baseline  Pt. lacks knowledge of apprpriate exercises to improve FI and SUI        PT Long Term Goals - 03/29/18 1425      PT LONG TERM GOAL #1   Title  Patient will score at or below 53/300 on the PFDI and 60/300 on the PFIQ to demonstrate a clinically meaningful decrease in disability and distress due to pelvic floor dysfunction.    Baseline  PFDI: 98/300, PFIQ: 105/300    Time  10    Period  Weeks    Status  New    Target Date  06/07/18      PT LONG TERM GOAL #2   Title  Patient will score at or below 20/61 of the FISI to demonstrate decreased fecal incontinence and improved quality of life.    Baseline  FISI: 39/61    Time  10    Period  Weeks    Status  New    Target Date  06/07/18      PT LONG TERM GOAL #3   Title  Patient will report no episodes of SUI over the course of the prior two weeks to demonstrate improved functional ability.    Baseline  SUI requirng panty-liner daily     Time  10    Period  Weeks    Status  New    Target Date  06/21/18      PT LONG TERM GOAL #4   Title  Pt. will have the confidence to go on a shopping trip or go to church requiring her to  be away from the house for > 2 hours to demonstrate improved participation and quality of life.    Baseline  has greatly reduced her participation due to embarassment and fear of FI.    Time  10    Period  Weeks    Status  New    Target Date  06/07/18            Plan - 04/02/18 0930    Clinical Impression Statement  Pt. has ggreat difficulty coordinating breathing to allow for TP release and PFM lengthening but was able to demonstrate understanding and ~ 65% success rate. Internal assessment revealed significant spasms and coordination issues with breathing. Cotinue per POC.    Clinical Presentation  Evolving    Clinical Decision Making  High    Rehab Potential  Fair    Clinical Impairments Affecting Rehab Potential  complex medical history, age, scoliosis, osteoporosis    PT Frequency  1x / week    PT Duration  Other (comment)    PT Treatment/Interventions  ADLs/Self Care Home Management;Biofeedback;Electrical Stimulation;Moist Heat;Gait training;Stair training;Functional mobility training;Therapeutic activities;Therapeutic exercise;Balance training;Neuromuscular re-education;Manual techniques;Scar mobilization;Passive range of motion;Dry needling;Taping;Spinal Manipulations;Joint Manipulations    PT Next Visit Plan  TP release Vs DN surrounding hips and low back as well as GENTLE thoracic mobility to improve Pt's ability to coordinate breathing.    PT Home Exercise Plan  Diaphragmatic breathing.    Consulted and Agree with Plan of Care  Patient       Patient will benefit from skilled therapeutic intervention in order to improve the following deficits and impairments:  Increased fascial restricitons, Improper body mechanics, Decreased coordination, Decreased mobility, Increased muscle spasms,  Postural dysfunction, Decreased activity tolerance, Decreased endurance, Decreased range of motion, Decreased strength, Impaired perceived functional ability, Decreased balance  Visit Diagnosis: Muscle weakness (generalized)  Other lack of coordination  Sacrococcygeal disorders, not elsewhere classified  Other muscle spasm  Abnormal posture     Problem List Patient Active Problem List   Diagnosis Date Noted  . Swelling of limb 10/10/2016  . Pain in limb 10/10/2016  . Varicose veins of leg with pain, left 10/10/2016  . Abnormal CT scan of lung 10/29/2015  . Personal history of tobacco use, presenting hazards to health 11/04/2014  . Preop cardiovascular exam 09/11/2011  . SOB (shortness of breath) 08/21/2011  . Chest pain 08/21/2011  . PVC's (premature ventricular contractions) 08/21/2011   Willa Rough DPT, ATC Willa Rough 04/02/2018, 9:34 AM  [place] MAIN [facility] SERVICES [address] [place], [place], [address] Phone: [phone]   Fax:  [phone]  Name: Sheila Peters MRN: [id number] Date of Birth: 1941-04-13

## 2018-04-01 NOTE — Patient Instructions (Signed)
Stabilization: Diaphragmatic Breathing    Lie with knees bent, feet flat. Place one hand on stomach, other on chest. Breathe deeply through nose, lifting belly hand without any motion of hand on chest. Do this for at least 10 minutes a day, starting with a heavy book on your tummy that you can see and feel rising as you breathe in and falling as you breathe out.

## 2018-04-03 ENCOUNTER — Ambulatory Visit: Payer: Medicare Other

## 2018-04-10 ENCOUNTER — Ambulatory Visit: Payer: Medicare Other

## 2018-04-10 DIAGNOSIS — R278 Other lack of coordination: Secondary | ICD-10-CM

## 2018-04-10 DIAGNOSIS — M62838 Other muscle spasm: Secondary | ICD-10-CM

## 2018-04-10 DIAGNOSIS — R293 Abnormal posture: Secondary | ICD-10-CM

## 2018-04-10 DIAGNOSIS — M6281 Muscle weakness (generalized): Secondary | ICD-10-CM

## 2018-04-10 DIAGNOSIS — M533 Sacrococcygeal disorders, not elsewhere classified: Secondary | ICD-10-CM

## 2018-04-10 NOTE — Therapy (Addendum)
[place] MAIN [facility] SERVICES [address] [place], [place], [address] Phone: [phone]   Fax:  [phone]  Physical Therapy Treatment  Patient Details  Name: Sheila Peters MRN: [id number] Date of Birth: 08-07-1940 Referring Provider (PT): Tammi Klippel   Encounter Date: 04/10/2018  PT End of Session - 04/15/18 1555    Visit Number  3    Number of Visits  10    Date for PT Re-Evaluation  06/07/18    Authorization Type  Medicare    Authorization - Visit Number  3    Authorization - Number of Visits  10    PT Start Time  2952    PT Stop Time  1535    PT Time Calculation (min)  60 min    Activity Tolerance  Patient tolerated treatment well    Behavior During Therapy  [facility] for tasks assessed/performed       Past Medical History:  Diagnosis Date  . Arthritis   . Asthma   . Clostridium difficile diarrhea   . Colitis, nonspecific   . COPD (chronic obstructive pulmonary disease) ([place])   . GERD (gastroesophageal reflux disease)   . Hypothyroidism    post-surgical  . Multiple thyroid nodules    post excision  . Nephrolithiasis    spontaneously passed  . Nephrolithiasis   . Non-toxic uninodular goiter   . Osteoporosis, postmenopausal   . Ulcerative colitis, chronic ([place])   . Ulcerative pancolitis without complication [facility])     Past Surgical History:  Procedure Laterality Date  . ABDOMINAL HYSTERECTOMY    . ANKLE SURGERY     left fracture  . BACK SURGERY     herniated disk  . COLONOSCOPY N/A 02/26/2017   Procedure: COLONOSCOPY;  Surgeon: Manya Silvas, MD;  Location: [facility] ENDOSCOPY;  Service: Endoscopy;  Laterality: N/A;  . COLONOSCOPY WITH PROPOFOL N/A 11/02/2014   Procedure: COLONOSCOPY WITH PROPOFOL;  Surgeon: Josefine Class, MD;  Location: [facility] ENDOSCOPY;  Service: Endoscopy;  Laterality: N/A;  . excision of thyroid nodule    . 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 JOINT REPLACEMENT     2014 lth    There were no vitals filed for this visit.    Pelvic Floor Physical Therapy Treatment Note  SCREENING  Changes in medications, allergies, or medical history?: Started Remicade and this is helping her not have have as much bleeding and is having fewer BM's throughout the day.     SUBJECTIVE  Patient reports: She has been doing her homework   Precautions:  Osteoporosis, Skin CA  Pain update:  Location of pain: L LB Current pain:  2/10  Max pain:  6/10 Least pain:  0/10 Nature of pain: achy  Patient Goals: To be able to go onto a trip without having to have worry about incontinence on her mid 24 hrs. Per day.   OBJECTIVE  Changes in: Posture/Observations:  Pt. Demonstrates significant kyphosis and forward head.  Range of Motion/Flexibilty:  Pt. Demonstrates ~ 5 degrees of thoracolumbar rotation. Cervical rotation limited and painful past ~ 45 deg to the R pre-treatment, ~ 70 deg. Following treatment with no pain.  INTERVENTIONS THIS SESSION: Manual: Performed gentle overpressure into B thoracolumbar rotation and cervical distraction and TP release to B cervical paraspinals to improve mobility of spine and decreased pressure on neurological structures for  improved recruitment of PFM and decreased incontinence and pain.  Therex: educated on and practiced thoracolumbar rotation in prone and pelvic tilts with exhale to improve coordination of musculature surrounding the pelvis to allow for improved PFM recruitment and coordination for decreased incontinence.    Total time: 60 min.                          PT Education - 04/15/18 1554    Education Details  See Pt. Instructions and Interventions this session.    Person(s) Educated  Patient    Methods  Explanation;Demonstration;Verbal cues;Handout;Tactile cues    Comprehension   Verbalized understanding;Returned demonstration;Verbal cues required;Tactile cues required;Need further instruction       PT Short Term Goals - 03/30/18 1815      PT SHORT TERM GOAL #1   Title  Patient will demonstrate a coordinated contraction, relaxation, and bulge of the pelvic floor muscles to demonstrate functional recruitment and motion and allow for further strengthening.    Baseline  History suggests poor PFM coordination    Time  5    Period  Weeks    Status  New    Target Date  05/04/18      PT SHORT TERM GOAL #2   Title  Patient will demonstrate improved pelvic alignment and balance of musculature surrounding the pelvis to facilitate decreased PFM spasms and decrease pelvic pain.    Baseline  Pt. has moderate scoliosis that is impacting her pelvic alignment and encouraging spasms surrounding the pelvis.     Time  5    Period  Weeks    Status  New    Target Date  05/04/18      PT SHORT TERM GOAL #3   Title  Patient will demonstrate HEP x1 in the clinic to demonstrate understanding and proper form to allow for further improvement.    Baseline  Pt. lacks knowledge of apprpriate exercises to improve FI and SUI        PT Long Term Goals - 03/29/18 1425      PT LONG TERM GOAL #1   Title  Patient will score at or below 53/300 on the PFDI and 60/300 on the PFIQ to demonstrate a clinically meaningful decrease in disability and distress due to pelvic floor dysfunction.    Baseline  PFDI: 98/300, PFIQ: 105/300    Time  10    Period  Weeks    Status  New    Target Date  06/07/18      PT LONG TERM GOAL #2   Title  Patient will score at or below 20/61 of the FISI to demonstrate decreased fecal incontinence and improved quality of life.    Baseline  FISI: 39/61    Time  10    Period  Weeks    Status  New    Target Date  06/07/18      PT LONG TERM GOAL #3   Title  Patient will report no episodes of SUI over the course of the prior two weeks to demonstrate improved  functional ability.    Baseline  SUI requirng panty-liner daily    Time  10    Period  Weeks    Status  New    Target Date  06/21/18      PT LONG TERM GOAL #4   Title  Pt. will have the confidence to go on a shopping trip or go to church requiring her  to be away from the house for > 2 hours to demonstrate improved participation and quality of life.    Baseline  has greatly reduced her participation due to embarassment and fear of FI.    Time  10    Period  Weeks    Status  New    Target Date  06/07/18            Plan - 04/15/18 1555    Clinical Impression Statement  Pt. did not tolerate L side-lying well due to pain in her L hip but should be able to perform exercise at home on her bed with decreased discomfort. She has difficulty practicing diaphragmatic breathing to allow for successful TP release but demonstrated some decreased tenderness in upper glute med. with treatment, will benefit from other treatment technique to decrease pain around L hip. She demonstrated understanding of all education provided and made small gains in her thoracic mobility within the treatment session but extra care must be taken to allow time for tissue extensibility due to age and osteoporosis. Continue per POC.    Clinical Presentation  Evolving    Clinical Decision Making  High    Rehab Potential  Fair    Clinical Impairments Affecting Rehab Potential  complex medical history, age, scoliosis, osteoporosis    PT Frequency  1x / week    PT Duration  Other (comment)    PT Treatment/Interventions  ADLs/Self Care Home Management;Biofeedback;Electrical Stimulation;Moist Heat;Gait training;Stair training;Functional mobility training;Therapeutic activities;Therapeutic exercise;Balance training;Neuromuscular re-education;Manual techniques;Scar mobilization;Passive range of motion;Dry needling;Taping;Spinal Manipulations;Joint Manipulations    PT Next Visit Plan  TP release Vs DN surrounding hips and low back as  well as GENTLE thoracic mobility to improve Pt's ability to coordinate breathing.    PT Home Exercise Plan  Diaphragmatic breathing, hook-lying trunk rotations, pelvic tilts in hook-lying.    Consulted and Agree with Plan of Care  Patient       Patient will benefit from skilled therapeutic intervention in order to improve the following deficits and impairments:  Increased fascial restricitons, Improper body mechanics, Decreased coordination, Decreased mobility, Increased muscle spasms, Postural dysfunction, Decreased activity tolerance, Decreased endurance, Decreased range of motion, Decreased strength, Impaired perceived functional ability, Decreased balance  Visit Diagnosis: Muscle weakness (generalized)  Other lack of coordination  Sacrococcygeal disorders, not elsewhere classified  Other muscle spasm  Abnormal posture     Problem List Patient Active Problem List   Diagnosis Date Noted  . Swelling of limb 10/10/2016  . Pain in limb 10/10/2016  . Varicose veins of leg with pain, left 10/10/2016  . Abnormal CT scan of lung 10/29/2015  . Personal history of tobacco use, presenting hazards to health 11/04/2014  . Preop cardiovascular exam 09/11/2011  . SOB (shortness of breath) 08/21/2011  . Chest pain 08/21/2011  . PVC's (premature ventricular contractions) 08/21/2011   Willa Rough DPT, ATC Willa Rough 04/15/2018, 4:02 PM  [place] MAIN [facility] SERVICES [address] [place], [place], [address] Phone: [phone]   Fax:  [phone]  Name: OTHELLA SLAPPEY MRN: [id number] Date of Birth: [birth year]

## 2018-04-10 NOTE — Patient Instructions (Signed)
  Exhale as you twist to the side with your knees, inhale to the center then repeat to the other side. Do this 30 times total. Each direction counts as 1.   Pelvic Tilt With Pelvic Floor (Hook-Lying)        Lie with hips and knees bent. Squeeze pelvic floor and flatten low back while breathing out so that pelvis tilts. Repeat _3x10__ times. Do _1-2__ times a day.

## 2018-04-18 ENCOUNTER — Ambulatory Visit: Payer: Medicare Other

## 2018-04-24 DIAGNOSIS — I700 Atherosclerosis of aorta: Secondary | ICD-10-CM

## 2018-04-24 HISTORY — DX: Atherosclerosis of aorta: I70.0

## 2018-04-25 ENCOUNTER — Ambulatory Visit: Payer: Medicare Other | Attending: Student

## 2018-04-25 DIAGNOSIS — R293 Abnormal posture: Secondary | ICD-10-CM | POA: Insufficient documentation

## 2018-04-25 DIAGNOSIS — M6281 Muscle weakness (generalized): Secondary | ICD-10-CM | POA: Insufficient documentation

## 2018-04-25 DIAGNOSIS — M533 Sacrococcygeal disorders, not elsewhere classified: Secondary | ICD-10-CM | POA: Insufficient documentation

## 2018-04-25 DIAGNOSIS — R278 Other lack of coordination: Secondary | ICD-10-CM | POA: Diagnosis present

## 2018-04-25 DIAGNOSIS — M62838 Other muscle spasm: Secondary | ICD-10-CM | POA: Diagnosis present

## 2018-04-25 NOTE — Patient Instructions (Signed)
Kegel exercises:    With neutral spine, tighten pelvic floor by imagining you are stopping the flow of urine, squeezing only around the vagina and anus.  Quick-Flicks: Pull up and in quickly and then relax allowing just enough time for the muscles to full lengthen before the next contraction. Do _10__ repetitions in a row, stopping if the pelvic floor muscle gets tired and other muscles try to take over.  Long-Holds: Hold for _3_ seconds and then fully release, repeat _5__ times.   Repeat both of these exercises _3-5__ times throughout the day

## 2018-04-25 NOTE — Therapy (Addendum)
[place] MAIN [facility] SERVICES [address] [place], [place], [address] Phone: [phone]   Fax:  [phone]  Physical Therapy Treatment  Patient Details  Name: Sheila Peters MRN: [id number] Date of Birth: 01/08/1941 Referring Provider (PT): Tammi Klippel   Encounter Date: 04/25/2018  PT End of Session - 06/05/18 0909    Visit Number  4    Number of Visits  10    Date for PT Re-Evaluation  06/07/18    Authorization Type  Medicare    Authorization - Visit Number  4    Authorization - Number of Visits  10    PT Start Time  1325    PT Stop Time  1425    PT Time Calculation (min)  60 min    Activity Tolerance  Patient tolerated treatment well    Behavior During Therapy  [facility] for tasks assessed/performed       Past Medical History:  Diagnosis Date  . Arthritis   . Asthma   . Clostridium difficile diarrhea   . Colitis, nonspecific   . COPD (chronic obstructive pulmonary disease) ([place])   . GERD (gastroesophageal reflux disease)   . Hypothyroidism    post-surgical  . Multiple thyroid nodules    post excision  . Nephrolithiasis    spontaneously passed  . Nephrolithiasis   . Non-toxic uninodular goiter   . Osteoporosis, postmenopausal   . Ulcerative colitis, chronic ([place])   . Ulcerative pancolitis without complication [facility])     Past Surgical History:  Procedure Laterality Date  . ABDOMINAL HYSTERECTOMY    . ANKLE SURGERY     left fracture  . BACK SURGERY     herniated disk  . COLONOSCOPY N/A 02/26/2017   Procedure: COLONOSCOPY;  Surgeon: Manya Silvas, MD;  Location: [facility] ENDOSCOPY;  Service: Endoscopy;  Laterality: N/A;  . COLONOSCOPY WITH PROPOFOL N/A 11/02/2014   Procedure: COLONOSCOPY WITH PROPOFOL;  Surgeon: Josefine Class, MD;  Location: [facility] ENDOSCOPY;  Service: Endoscopy;  Laterality: N/A;  . excision of thyroid nodule    . 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 JOINT REPLACEMENT     2014 lth    There were no vitals filed for this visit.    Pelvic Floor Physical Therapy Treatment Note  SCREENING  Changes in medications, allergies, or medical history?: none    SUBJECTIVE  Patient reports: She thought she was better but yesterday morning she "just lost it" and had a formed BM escape. She has discharge still occasionally. She gets some low back discomfort when and after doing her exercises but it goes away within an hour. Has done her exercises ~ 50% of the days over the past week.  Precautions:  Osteoporosis and scoliosis  Pain update:  Location of pain: low back Current pain:  0/10  Max pain:  5/10 Least pain:  0/10 Nature of pain: achy  Patient Goals: To be able to go onto a trip without having to have worry about incontinence on her mid 24 hrs. Per day.   OBJECTIVE  Changes in:   Range of Motion/Flexibilty:  Decreased lumbosacral mobility into flexion and rot B.  Pelvic floor: Able to demonstrate coordinated but weak PFM contraction with use of posterior pelvic tilt and exhale to encourage recruitment.  Palpation: TTP to B Iliacus and R psoas   INTERVENTIONS THIS SESSION:  Manual: performed TP release and STM to B Iliacus and R psoas followed by MET correction to L anterior innominate rotation to decrease pain and spasm and allow for decreased pressure on lumbosacral nerve roots.  Dry-needle: Performed TPDN with .30x69m needle to L Iliacus to decrease pain and spasm  and allow for decreased pressure on lumbosacral nerve roots. Therex: reviewed posterior pelvic tilts with exhale and hook-lying thoraco-lumbar rotations to improve performance and efficacy of HEP. Educated on and given kegels to improve strength and coordination of the PFM.  Total time: 65 min.                          PT Education - 06/05/18 0906     Education Details  See Pt. Instructions and Interventions this session.    Person(s) Educated  Patient    Methods  Explanation;Tactile cues;Verbal cues;Handout    Comprehension  Verbalized understanding;Returned demonstration;Verbal cues required;Tactile cues required;Need further instruction       PT Short Term Goals - 03/30/18 1815      PT SHORT TERM GOAL #1   Title  Patient will demonstrate a coordinated contraction, relaxation, and bulge of the pelvic floor muscles to demonstrate functional recruitment and motion and allow for further strengthening.    Baseline  History suggests poor PFM coordination    Time  5    Period  Weeks    Status  New    Target Date  05/04/18      PT SHORT TERM GOAL #2   Title  Patient will demonstrate improved pelvic alignment and balance of musculature surrounding the pelvis to facilitate decreased PFM spasms and decrease pelvic pain.    Baseline  Pt. has moderate scoliosis that is impacting her pelvic alignment and encouraging spasms surrounding the pelvis.     Time  5    Period  Weeks    Status  New    Target Date  05/04/18      PT SHORT TERM GOAL #3   Title  Patient will demonstrate HEP x1 in the clinic to demonstrate understanding and proper form to allow for further improvement.    Baseline  Pt. lacks knowledge of apprpriate exercises to improve FI and SUI        PT Long Term Goals - 03/29/18 1425      PT LONG TERM GOAL #1   Title  Patient will score at or below 53/300 on the PFDI and 60/300 on the PFIQ to demonstrate a clinically meaningful decrease in disability and distress due to pelvic floor dysfunction.    Baseline  PFDI: 98/300, PFIQ: 105/300    Time  10    Period  Weeks    Status  New    Target Date  06/07/18      PT LONG TERM GOAL #2   Title  Patient will score at or below 20/61 of the FISI to demonstrate decreased fecal incontinence and improved quality of life.    Baseline  FISI: 39/61    Time  10    Period  Weeks     Status  New    Target Date  06/07/18      PT LONG TERM GOAL #3   Title  Patient will report no episodes of SUI over the course of the prior two weeks to demonstrate improved functional ability.    Baseline  SUI requirng panty-liner daily    Time  10    Period  Weeks    Status  New    Target Date  06/21/18      PT LONG TERM GOAL #4   Title  Pt. will have the confidence to go on a shopping trip or go to church requiring her to be away from the house for > 2 hours to demonstrate improved participation and quality of life.    Baseline  has greatly reduced her participation due to embarassment and fear of FI.    Time  10    Period  Weeks    Status  New    Target Date  06/07/18            Plan - 06/05/18 0915    Clinical Impression Statement  Pt. Responded well to all interventions today, demonstrating decreased spasm and improved pelvic alignment as well as improved ability to recruit and coordinate the muscles surrounding and within the Pelvis to decrease incontinence. Continue per POC.    Clinical Presentation  Evolving    Clinical Decision Making  High    PT Frequency  1x / week    PT Duration  Other (comment)    PT Treatment/Interventions  ADLs/Self Care Home Management;Biofeedback;Electrical Stimulation;Moist Heat;Gait training;Stair training;Functional mobility training;Therapeutic activities;Therapeutic exercise;Balance training;Neuromuscular re-education;Manual techniques;Scar mobilization;Passive range of motion;Dry needling;Taping;Spinal Manipulations;Joint Manipulations    PT Next Visit Plan  Discuss DN for back and L hip, biofeedback lying on R side PRN.     PT Home Exercise Plan  Diaphragmatic breathing, hook-lying trunk rotations, pelvic tilts in hook-lying, kegels.    Consulted and Agree with Plan of Care  Patient       Patient will benefit from skilled therapeutic intervention in order to improve the following deficits and impairments:  Increased fascial  restricitons, Improper body mechanics, Decreased coordination, Decreased mobility, Increased muscle spasms, Postural dysfunction, Decreased activity tolerance, Decreased endurance, Decreased range of motion, Decreased strength, Impaired perceived functional ability, Decreased balance  Visit Diagnosis: Muscle weakness (generalized)  Other lack of coordination  Sacrococcygeal disorders, not elsewhere classified  Other muscle spasm  Abnormal posture     Problem List Patient Active Problem List   Diagnosis Date Noted  . Swelling of limb 10/10/2016  . Pain in limb 10/10/2016  . Varicose veins of leg with pain, left 10/10/2016  . Abnormal CT scan of lung 10/29/2015  . Personal history of tobacco use, presenting hazards to health 11/04/2014  . Preop cardiovascular exam 09/11/2011  . SOB (shortness of breath) 08/21/2011  . Chest pain 08/21/2011  . PVC's (premature ventricular contractions) 08/21/2011   Willa Rough DPT, ATC Willa Rough 06/05/2018, 9:18 AM  [place] MAIN [facility] SERVICES [address] [place], [place], [address] Phone: [phone]   Fax:  [phone]  Name: Sheila Peters MRN: [id number] Date of Birth: 24-Apr-1941

## 2018-05-01 ENCOUNTER — Ambulatory Visit: Payer: Medicare Other

## 2018-05-01 DIAGNOSIS — M62838 Other muscle spasm: Secondary | ICD-10-CM

## 2018-05-01 DIAGNOSIS — M533 Sacrococcygeal disorders, not elsewhere classified: Secondary | ICD-10-CM

## 2018-05-01 DIAGNOSIS — R278 Other lack of coordination: Secondary | ICD-10-CM

## 2018-05-01 DIAGNOSIS — R293 Abnormal posture: Secondary | ICD-10-CM

## 2018-05-01 DIAGNOSIS — M6281 Muscle weakness (generalized): Secondary | ICD-10-CM | POA: Diagnosis not present

## 2018-05-01 NOTE — Therapy (Addendum)
[place] MAIN [facility] SERVICES [address] [place], [place], [address] Phone: [phone]   Fax:  [phone]  Physical Therapy Treatment  Patient Details  Name: Sheila Peters MRN: [id number] Date of Birth: 10/09/1940 Referring Provider (PT): Tammi Klippel   Encounter Date: 05/01/2018  PT End of Session - 05/07/18 1551    Visit Number  5    Number of Visits  10    Date for PT Re-Evaluation  06/07/18    Authorization Type  Medicare    Authorization - Visit Number  5    Authorization - Number of Visits  10    PT Start Time  8938    PT Stop Time  1430    PT Time Calculation (min)  55 min    Activity Tolerance  Patient tolerated treatment well    Behavior During Therapy  [facility] for tasks assessed/performed       Past Medical History:  Diagnosis Date  . Arthritis   . Asthma   . Clostridium difficile diarrhea   . Colitis, nonspecific   . COPD (chronic obstructive pulmonary disease) ([place])   . GERD (gastroesophageal reflux disease)   . Hypothyroidism    post-surgical  . Multiple thyroid nodules    post excision  . Nephrolithiasis    spontaneously passed  . Nephrolithiasis   . Non-toxic uninodular goiter   . Osteoporosis, postmenopausal   . Ulcerative colitis, chronic ([place])   . Ulcerative pancolitis without complication [facility])     Past Surgical History:  Procedure Laterality Date  . ABDOMINAL HYSTERECTOMY    . ANKLE SURGERY     left fracture  . BACK SURGERY     herniated disk  . COLONOSCOPY N/A 02/26/2017   Procedure: COLONOSCOPY;  Surgeon: Manya Silvas, MD;  Location: [facility] ENDOSCOPY;  Service: Endoscopy;  Laterality: N/A;  . COLONOSCOPY WITH PROPOFOL N/A 11/02/2014   Procedure: COLONOSCOPY WITH PROPOFOL;  Surgeon: Josefine Class, MD;  Location: [facility] ENDOSCOPY;  Service: Endoscopy;  Laterality: N/A;  . excision of thyroid nodule    . 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 JOINT REPLACEMENT     2014 lth    There were no vitals filed for this visit.    Pelvic Floor Physical Therapy Treatment Note  SCREENING  Changes in medications, allergies, or medical history?: none    SUBJECTIVE  Patient reports: She is doing well with her exercises, slept well last night but had a bad day yesterday. Ache when standing for a long time. Slept really good after the last visit and had a great day the next day, had 2 BM's in the  Morning and felt like she had emptied completely.   Precautions:  Osteoporosis and scoliosis  Pain update:  Location of pain: lower abdomen Current pain:  0/10  Max pain:  5/10 Least pain:  0/10 Nature of pain: ache   Patient Goals: To be able to go onto a trip without having to have worry about fecal or urinary incontinence on her mid 24 hrs. Per day.   OBJECTIVE  Changes in:  Range of Motion/Flexibilty:  Improved lumbothoracic rotation B, with restriction greater on the L>R.  Pelvic floor: Poor coordination at beginning of session, improved ability to recruit PFM with breath with practice. Continues to have decreased endurance. Decreased tenderness of PFM.  INTERVENTIONS THIS SESSION: Therex:  Reviewed HEP for performance improvement/confidence due to Pt. Unsure about success at home. educated on and practiced kegels to improve recruitment and awareness of the PFM.  Theract: educated on and practiced urge-suppression technique to decrease incontinence of urine and feces.     Total time: 55 min.                          PT Education - 05/07/18 1551    Education Details  See Pt. Instructions and Interventions this session    Person(s) Educated  Patient    Methods  Explanation;Tactile cues;Verbal cues;Handout    Comprehension  Verbalized understanding;Returned demonstration;Verbal cues required;Tactile cues  required;Need further instruction       PT Short Term Goals - 03/30/18 1815      PT SHORT TERM GOAL #1   Title  Patient will demonstrate a coordinated contraction, relaxation, and bulge of the pelvic floor muscles to demonstrate functional recruitment and motion and allow for further strengthening.    Baseline  History suggests poor PFM coordination    Time  5    Period  Weeks    Status  New    Target Date  05/04/18      PT SHORT TERM GOAL #2   Title  Patient will demonstrate improved pelvic alignment and balance of musculature surrounding the pelvis to facilitate decreased PFM spasms and decrease pelvic pain.    Baseline  Pt. has moderate scoliosis that is impacting her pelvic alignment and encouraging spasms surrounding the pelvis.     Time  5    Period  Weeks    Status  New    Target Date  05/04/18      PT SHORT TERM GOAL #3   Title  Patient will demonstrate HEP x1 in the clinic to demonstrate understanding and proper form to allow for further improvement.    Baseline  Pt. lacks knowledge of apprpriate exercises to improve FI and SUI        PT Long Term Goals - 03/29/18 1425      PT LONG TERM GOAL #1   Title  Patient will score at or below 53/300 on the PFDI and 60/300 on the PFIQ to demonstrate a clinically meaningful decrease in disability and distress due to pelvic floor dysfunction.    Baseline  PFDI: 98/300, PFIQ: 105/300    Time  10    Period  Weeks    Status  New    Target Date  06/07/18      PT LONG TERM GOAL #2   Title  Patient will score at or below 20/61 of the FISI to demonstrate decreased fecal incontinence and improved quality of life.    Baseline  FISI: 39/61    Time  10    Period  Weeks    Status  New    Target Date  06/07/18      PT LONG TERM GOAL #3   Title  Patient will report no episodes of SUI over the course of the prior two weeks to demonstrate improved functional ability.    Baseline  SUI requirng panty-liner daily    Time  10    Period   Weeks    Status  New    Target Date  06/21/18      PT LONG TERM GOAL #4   Title  Pt. will have the confidence to go on a shopping trip or go to church requiring her to  be away from the house for > 2 hours to demonstrate improved participation and quality of life.    Baseline  has greatly reduced her participation due to embarassment and fear of FI.    Time  10    Period  Weeks    Status  New    Target Date  06/07/18            Plan - 05/07/18 1552    Clinical Impression Statement  Pt. responded well to all interventions today, demonstrating improved coordination and recruitment of PFM as well as understanding of new exercise and improved performance of HEP exercises. Continue per POC.    Clinical Presentation  Evolving    Clinical Decision Making  High    Rehab Potential  Fair    Clinical Impairments Affecting Rehab Potential  complex medical history, age, scoliosis, osteoporosis    PT Frequency  1x / week    PT Duration  Other (comment)    PT Treatment/Interventions  ADLs/Self Care Home Management;Biofeedback;Electrical Stimulation;Moist Heat;Gait training;Stair training;Functional mobility training;Therapeutic activities;Therapeutic exercise;Balance training;Neuromuscular re-education;Manual techniques;Scar mobilization;Passive range of motion;Dry needling;Taping;Spinal Manipulations;Joint Manipulations    PT Next Visit Plan  Discuss DN for back and L hip, biofeedback lying on R side PRN.     PT Home Exercise Plan  Diaphragmatic breathing, hook-lying trunk rotations, pelvic tilts in hook-lying, kegels.    Consulted and Agree with Plan of Care  Patient       Patient will benefit from skilled therapeutic intervention in order to improve the following deficits and impairments:  Increased fascial restricitons, Improper body mechanics, Decreased coordination, Decreased mobility, Increased muscle spasms, Postural dysfunction, Decreased activity tolerance, Decreased endurance, Decreased  range of motion, Decreased strength, Impaired perceived functional ability, Decreased balance  Visit Diagnosis: Muscle weakness (generalized)  Other lack of coordination  Sacrococcygeal disorders, not elsewhere classified  Other muscle spasm  Abnormal posture     Problem List Patient Active Problem List   Diagnosis Date Noted  . Swelling of limb 10/10/2016  . Pain in limb 10/10/2016  . Varicose veins of leg with pain, left 10/10/2016  . Abnormal CT scan of lung 10/29/2015  . Personal history of tobacco use, presenting hazards to health 11/04/2014  . Preop cardiovascular exam 09/11/2011  . SOB (shortness of breath) 08/21/2011  . Chest pain 08/21/2011  . PVC's (premature ventricular contractions) 08/21/2011   Willa Rough DPT, ATC Willa Rough 05/08/2018, 3:58 PM  [place] MAIN [facility] SERVICES [address] [place], [place], [address] Phone: [phone]   Fax:  [phone]  Name: Sheila Peters MRN: [id number] Date of Birth: March 27, 1941

## 2018-05-01 NOTE — Patient Instructions (Signed)
Kegel exercises:    With neutral spine, tighten pelvic floor by imagining you are stopping the flow of urine, squeezing only around the vagina and anus.  Quick-Flicks: Pull up and in quickly and then relax allowing just enough time for the muscles to full lengthen before the next contraction. Do _10__ repetitions in a row, stopping if the pelvic floor muscle gets tired and other muscles try to take over.  Long-Holds: Hold for _3-5__ seconds and then fully release, repeat _5__ times.   Repeat both of these exercises _5__ times throughout the day      Urge supression technique:  1) Take a deep breath to convince yourself that you are in control and calm the nervous system.  2) Do 5 "quick-flick" kegels (pelvic floor muscle contractions) and re-assess the urge. Repeat another set if urge is still present. 3) Once the urge has decreased, start walking calmly to the the bathroom. Stop and repeat steps 1 and 2 as many times as needed until you can successfully get to the toilet. 4) Only once seated, take a deep breath and allow the pelvic floor muscles to relax and allow for the urine to flow.    Do not be discouraged if you are not successful the first couple times, this is normal and it will take practice but remember that YOU are in control. Start by practicing this at home where you do not have to worry as much if there were to be an accident. Allowing yourself to get rushed or nervous puts the bladder back in control and will not allow the technique to work.

## 2018-05-08 ENCOUNTER — Ambulatory Visit: Payer: Medicare Other

## 2018-05-08 DIAGNOSIS — M6281 Muscle weakness (generalized): Secondary | ICD-10-CM | POA: Diagnosis not present

## 2018-05-08 DIAGNOSIS — M62838 Other muscle spasm: Secondary | ICD-10-CM

## 2018-05-08 DIAGNOSIS — M533 Sacrococcygeal disorders, not elsewhere classified: Secondary | ICD-10-CM

## 2018-05-08 DIAGNOSIS — R293 Abnormal posture: Secondary | ICD-10-CM

## 2018-05-08 DIAGNOSIS — R278 Other lack of coordination: Secondary | ICD-10-CM

## 2018-05-08 NOTE — Patient Instructions (Addendum)
*   Make "butt skis" out of a towel by folding it in half twice the long way and then folding it to where it lays along your thighs when you sit, with your tailbone in the space between the two skis.   * After your kegels, take a deep breath and try to feel the pelvic floor lengthen before squeezing again!

## 2018-05-08 NOTE — Therapy (Addendum)
[place] MAIN [facility] SERVICES [address] [place], [place], [address] Phone: [phone]   Fax:  [phone]  Physical Therapy Treatment  Patient Details  Name: Sheila Peters MRN: [id number] Date of Birth: Jan 29, 1941 Referring Provider (PT): Tammi Klippel   Encounter Date: 05/08/2018  PT End of Session - 05/08/18 1503    Visit Number  6    Number of Visits  10    Date for PT Re-Evaluation  06/07/18    Authorization Type  Medicare    Authorization - Visit Number  6    Authorization - Number of Visits  10    PT Start Time  0174    PT Stop Time  1440    PT Time Calculation (min)  65 min    Activity Tolerance  Patient tolerated treatment well    Behavior During Therapy  [facility] for tasks assessed/performed       Past Medical History:  Diagnosis Date  . Arthritis   . Asthma   . Clostridium difficile diarrhea   . Colitis, nonspecific   . COPD (chronic obstructive pulmonary disease) ([place])   . GERD (gastroesophageal reflux disease)   . Hypothyroidism    post-surgical  . Multiple thyroid nodules    post excision  . Nephrolithiasis    spontaneously passed  . Nephrolithiasis   . Non-toxic uninodular goiter   . Osteoporosis, postmenopausal   . Ulcerative colitis, chronic ([place])   . Ulcerative pancolitis without complication [facility])     Past Surgical History:  Procedure Laterality Date  . ABDOMINAL HYSTERECTOMY    . ANKLE SURGERY     left fracture  . BACK SURGERY     herniated disk  . COLONOSCOPY N/A 02/26/2017   Procedure: COLONOSCOPY;  Surgeon: Manya Silvas, MD;  Location: [facility] ENDOSCOPY;  Service: Endoscopy;  Laterality: N/A;  . COLONOSCOPY WITH PROPOFOL N/A 11/02/2014   Procedure: COLONOSCOPY WITH PROPOFOL;  Surgeon: Josefine Class, MD;  Location: [facility] ENDOSCOPY;  Service: Endoscopy;  Laterality: N/A;  . excision of thyroid nodule    . 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 JOINT REPLACEMENT     2014 lth    There were no vitals filed for this visit.    Pelvic Floor Physical Therapy Treatment Note  SCREENING  Changes in medications, allergies, or medical history?: no    SUBJECTIVE  Patient reports: Can tell a difference in her urine stream, can feel the muscles better. She has not noticed a difference in her fecal incontinence yet.   Precautions:  Osteoporosis and scoliosis  Pain update: Location of pain: low back Current pain: 0/10  Max pain: 5/10 Least pain: 0/10 Nature of pain:achy  Patient Goals: To be able to go onto a trip without having to have worry about incontinence on her mid 24 hrs. Per day.   OBJECTIVE  Changes in: Pelvic floor: Pt. Had difficulty relaxing PFM to insert probe rectally for bio-feedback in hook-lying but was able to in side-lying. Started out at 133m and was able to relax and decrease tension down to ~50-624mfollowing kegel.   INTERVENTIONS THIS SESSION: Theract: Reviewed hook-lying twists and reassured Pt. About slight discomfort with performance. Educated on and tried "butt skis" with a towel to off-load the tailbone in seated for decreased pain. NM re-ed: Used verbal and visual feedback as well as visualization to  improve down-regulation of muscle tension as well as improve coordination so Pt. Can effectively hold in formed feces. Biofeedback: Educated Pt. On biofeedback and used rectal sensor to allow Pt. To visualize contraction, relaxation, and bearing down for greater control of bowels.  Total time: 65 min.                          PT Education - 05/08/18 1502    Education Details  See Pt. Instructions and Interventions this session.    Person(s) Educated  Patient    Methods  Explanation;Verbal cues;Other (comment)   biofeedback   Comprehension  Verbalized understanding;Returned  demonstration;Verbal cues required;Need further instruction       PT Short Term Goals - 03/30/18 1815      PT SHORT TERM GOAL #1   Title  Patient will demonstrate a coordinated contraction, relaxation, and bulge of the pelvic floor muscles to demonstrate functional recruitment and motion and allow for further strengthening.    Baseline  History suggests poor PFM coordination    Time  5    Period  Weeks    Status  New    Target Date  05/04/18      PT SHORT TERM GOAL #2   Title  Patient will demonstrate improved pelvic alignment and balance of musculature surrounding the pelvis to facilitate decreased PFM spasms and decrease pelvic pain.    Baseline  Pt. has moderate scoliosis that is impacting her pelvic alignment and encouraging spasms surrounding the pelvis.     Time  5    Period  Weeks    Status  New    Target Date  05/04/18      PT SHORT TERM GOAL #3   Title  Patient will demonstrate HEP x1 in the clinic to demonstrate understanding and proper form to allow for further improvement.    Baseline  Pt. lacks knowledge of apprpriate exercises to improve FI and SUI        PT Long Term Goals - 03/29/18 1425      PT LONG TERM GOAL #1   Title  Patient will score at or below 53/300 on the PFDI and 60/300 on the PFIQ to demonstrate a clinically meaningful decrease in disability and distress due to pelvic floor dysfunction.    Baseline  PFDI: 98/300, PFIQ: 105/300    Time  10    Period  Weeks    Status  New    Target Date  06/07/18      PT LONG TERM GOAL #2   Title  Patient will score at or below 20/61 of the FISI to demonstrate decreased fecal incontinence and improved quality of life.    Baseline  FISI: 39/61    Time  10    Period  Weeks    Status  New    Target Date  06/07/18      PT LONG TERM GOAL #3   Title  Patient will report no episodes of SUI over the course of the prior two weeks to demonstrate improved functional ability.    Baseline  SUI requirng panty-liner daily     Time  10    Period  Weeks    Status  New    Target Date  06/21/18      PT LONG TERM GOAL #4   Title  Pt. will have the confidence to go on a shopping trip or go to church requiring her to be away  from the house for > 2 hours to demonstrate improved participation and quality of life.    Baseline  has greatly reduced her participation due to embarassment and fear of FI.    Time  10    Period  Weeks    Status  New    Target Date  06/07/18            Plan - 05/08/18 1505    Clinical Impression Statement  Pt. responded well to all interventions today, demonstrating moderate reduction in resting PFM tension with the use of NM re-ed cueing and biofeedback but she continues to have difficulty sensing the probe and being able to intentionally relax/lengthen the PFM. Continue per POC.    Clinical Presentation  Evolving    Clinical Decision Making  High    Rehab Potential  Fair    Clinical Impairments Affecting Rehab Potential  complex medical history, age, scoliosis, osteoporosis    PT Frequency  1x / week    PT Duration  Other (comment)    PT Treatment/Interventions  ADLs/Self Care Home Management;Biofeedback;Electrical Stimulation;Moist Heat;Gait training;Stair training;Functional mobility training;Therapeutic activities;Therapeutic exercise;Balance training;Neuromuscular re-education;Manual techniques;Scar mobilization;Passive range of motion;Dry needling;Taping;Spinal Manipulations;Joint Manipulations    PT Next Visit Plan  Discuss DN for back and L hip, biofeedback lying on R side PRN.     PT Home Exercise Plan  Diaphragmatic breathing, hook-lying trunk rotations, pelvic tilts in hook-lying, kegels.    Consulted and Agree with Plan of Care  Patient       Patient will benefit from skilled therapeutic intervention in order to improve the following deficits and impairments:  Increased fascial restricitons, Improper body mechanics, Decreased coordination, Decreased mobility, Increased  muscle spasms, Postural dysfunction, Decreased activity tolerance, Decreased endurance, Decreased range of motion, Decreased strength, Impaired perceived functional ability, Decreased balance  Visit Diagnosis: Muscle weakness (generalized)  Other lack of coordination  Sacrococcygeal disorders, not elsewhere classified  Other muscle spasm  Abnormal posture     Problem List Patient Active Problem List   Diagnosis Date Noted  . Swelling of limb 10/10/2016  . Pain in limb 10/10/2016  . Varicose veins of leg with pain, left 10/10/2016  . Abnormal CT scan of lung 10/29/2015  . Personal history of tobacco use, presenting hazards to health 11/04/2014  . Preop cardiovascular exam 09/11/2011  . SOB (shortness of breath) 08/21/2011  . Chest pain 08/21/2011  . PVC's (premature ventricular contractions) 08/21/2011   Willa Rough DPT, ATC Willa Rough 05/08/2018, 4:01 PM  [place] MAIN [facility] SERVICES [address] [place], [place], [address] Phone: [phone]   Fax:  [phone]  Name: Sheila Peters MRN: [id number] Date of Birth: 12/28/1940

## 2018-05-15 ENCOUNTER — Ambulatory Visit: Payer: Medicare Other

## 2018-05-15 DIAGNOSIS — M533 Sacrococcygeal disorders, not elsewhere classified: Secondary | ICD-10-CM

## 2018-05-15 DIAGNOSIS — R278 Other lack of coordination: Secondary | ICD-10-CM

## 2018-05-15 DIAGNOSIS — R293 Abnormal posture: Secondary | ICD-10-CM

## 2018-05-15 DIAGNOSIS — M6281 Muscle weakness (generalized): Secondary | ICD-10-CM | POA: Diagnosis not present

## 2018-05-15 DIAGNOSIS — M62838 Other muscle spasm: Secondary | ICD-10-CM

## 2018-05-15 NOTE — Therapy (Addendum)
[place] MAIN [facility] SERVICES [address] [place], [place], [address] Phone: [phone]   Fax:  [phone]  Physical Therapy Treatment  Patient Details  Name: Sheila Peters MRN: [id number] Date of Birth: [birth year] Referring Provider (PT): Tammi Klippel   Encounter Date: 05/15/2018  PT End of Session - 05/17/18 2052    Visit Number  7    Number of Visits  10    Date for PT Re-Evaluation  06/07/18    Authorization Type  Medicare    Authorization - Visit Number  7    Authorization - Number of Visits  10    PT Start Time  1330    PT Stop Time  1435    PT Time Calculation (min)  65 min    Activity Tolerance  Patient tolerated treatment well    Behavior During Therapy  [facility] for tasks assessed/performed       Past Medical History:  Diagnosis Date  . Arthritis   . Asthma   . Clostridium difficile diarrhea   . Colitis, nonspecific   . COPD (chronic obstructive pulmonary disease) ([place])   . GERD (gastroesophageal reflux disease)   . Hypothyroidism    post-surgical  . Multiple thyroid nodules    post excision  . Nephrolithiasis    spontaneously passed  . Nephrolithiasis   . Non-toxic uninodular goiter   . Osteoporosis, postmenopausal   . Ulcerative colitis, chronic ([place])   . 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 ABDOMINAL HYSTERECTOMY    . ANKLE SURGERY     left fracture  . BACK SURGERY     herniated disk  . COLONOSCOPY N/A 02/26/2017   Procedure: COLONOSCOPY;  Surgeon: Manya Silvas, MD;  Location: [facility] ENDOSCOPY;  Service: Endoscopy;  Laterality: N/A;  . COLONOSCOPY WITH PROPOFOL N/A 11/02/2014   Procedure: COLONOSCOPY WITH PROPOFOL;  Surgeon: Josefine Class, MD;  Location: [facility] ENDOSCOPY;  Service: Endoscopy;  Laterality: N/A;  . excision of thyroid nodule    . 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 JOINT REPLACEMENT     2014 lth    There were no vitals filed for this visit.    Pelvic Floor Physical Therapy Treatment Note  SCREENING  Changes in medications, allergies, or medical history?: no    SUBJECTIVE  Patient reports: She has missed ~ 3-4 days with her exercises and can feel that she is getting some stronger with the kegels but not noticing any change in bowel leakage yet. Back pain was when she went shopping and was up and moving more than usual.  Precautions:  Osteoporosis and scoliosis  Pain update: Location of pain:low back Current pain:0/10  Max pain:5/10 Least pain:0/10 Petra Kuba of pain:achy  Patient Goals: To be able to go onto a trip without having to have worry about incontinence on her mid 24 hrs. Per day.   OBJECTIVE  Changes in: Posture/Observations:  Moderate scoliosis.  Pelvic floor: Spasm in OI B, able to release and improve recruitment with treatment.  Palpation: TTP to L Anterior, lateral, and posterior hip with restrictions in fascial mobility.  INTERVENTIONS THIS SESSION: Manual: Performed TP release to B OI externally as well as MFR to L SIJ and anterior L hip to decrease restriction and allow for decreased pressure on nerves innervating the  PFM. NM re-ed: Reviewed coordination of kegels with breathing with cueing to visualize specifically tightening at the anus and fully lengthening in-between contractions to allow for improved recruitment and control. Therex: reviewed HEP and reinforced importance of consistent performance to see improvement.  Total time: 60 min.                          PT Education - 05/17/18 2051    Education Details  See Interventions this session.    Person(s) Educated  Patient    Methods  Explanation;Tactile cues;Verbal cues    Comprehension  Verbalized understanding;Returned  demonstration;Verbal cues required;Tactile cues required;Need further instruction       PT Short Term Goals - 03/30/18 1815      PT SHORT TERM GOAL #1   Title  Patient will demonstrate a coordinated contraction, relaxation, and bulge of the pelvic floor muscles to demonstrate functional recruitment and motion and allow for further strengthening.    Baseline  History suggests poor PFM coordination    Time  5    Period  Weeks    Status  New    Target Date  05/04/18      PT SHORT TERM GOAL #2   Title  Patient will demonstrate improved pelvic alignment and balance of musculature surrounding the pelvis to facilitate decreased PFM spasms and decrease pelvic pain.    Baseline  Pt. has moderate scoliosis that is impacting her pelvic alignment and encouraging spasms surrounding the pelvis.     Time  5    Period  Weeks    Status  New    Target Date  05/04/18      PT SHORT TERM GOAL #3   Title  Patient will demonstrate HEP x1 in the clinic to demonstrate understanding and proper form to allow for further improvement.    Baseline  Pt. lacks knowledge of apprpriate exercises to improve FI and SUI        PT Long Term Goals - 03/29/18 1425      PT LONG TERM GOAL #1   Title  Patient will score at or below 53/300 on the PFDI and 60/300 on the PFIQ to demonstrate a clinically meaningful decrease in disability and distress due to pelvic floor dysfunction.    Baseline  PFDI: 98/300, PFIQ: 105/300    Time  10    Period  Weeks    Status  New    Target Date  06/07/18      PT LONG TERM GOAL #2   Title  Patient will score at or below 20/61 of the FISI to demonstrate decreased fecal incontinence and improved quality of life.    Baseline  FISI: 39/61    Time  10    Period  Weeks    Status  New    Target Date  06/07/18      PT LONG TERM GOAL #3   Title  Patient will report no episodes of SUI over the course of the prior two weeks to demonstrate improved functional ability.    Baseline  SUI  requirng panty-liner daily    Time  10    Period  Weeks    Status  New    Target Date  06/21/18      PT LONG TERM GOAL #4   Title  Pt. will have the confidence to go on a shopping trip or go to church requiring her to be away from the house  for > 2 hours to demonstrate improved participation and quality of life.    Baseline  has greatly reduced her participation due to embarassment and fear of FI.    Time  10    Period  Weeks    Status  New    Target Date  06/07/18            Plan - 05/17/18 2053    Clinical Impression Statement  Pt. responded well to all interventions today with decreased spasms and improved myofascial mobility as well as improved recruitment of and lengthening of PFM for kegels. Continue per POC.    Clinical Presentation  Evolving    Clinical Decision Making  High    Rehab Potential  Fair    Clinical Impairments Affecting Rehab Potential  complex medical history, age, scoliosis, osteoporosis    PT Frequency  1x / week    PT Duration  Other (comment)    PT Treatment/Interventions  ADLs/Self Care Home Management;Biofeedback;Electrical Stimulation;Moist Heat;Gait training;Stair training;Functional mobility training;Therapeutic activities;Therapeutic exercise;Balance training;Neuromuscular re-education;Manual techniques;Scar mobilization;Passive range of motion;Dry needling;Taping;Spinal Manipulations;Joint Manipulations    PT Next Visit Plan  Discuss DN for back and L hip, biofeedback lying on R side PRN.     PT Home Exercise Plan  Diaphragmatic breathing, hook-lying trunk rotations, pelvic tilts in hook-lying, kegels.    Consulted and Agree with Plan of Care  Patient       Patient will benefit from skilled therapeutic intervention in order to improve the following deficits and impairments:  Increased fascial restricitons, Improper body mechanics, Decreased coordination, Decreased mobility, Increased muscle spasms, Postural dysfunction, Decreased activity  tolerance, Decreased endurance, Decreased range of motion, Decreased strength, Impaired perceived functional ability, Decreased balance  Visit Diagnosis: Muscle weakness (generalized)  Other lack of coordination  Sacrococcygeal disorders, not elsewhere classified  Other muscle spasm  Abnormal posture     Problem List Patient Active Problem List   Diagnosis Date Noted  . Swelling of limb 10/10/2016  . Pain in limb 10/10/2016  . Varicose veins of leg with pain, left 10/10/2016  . Abnormal CT scan of lung 10/29/2015  . Personal history of tobacco use, presenting hazards to health 11/04/2014  . Preop cardiovascular exam 09/11/2011  . SOB (shortness of breath) 08/21/2011  . Chest pain 08/21/2011  . PVC's (premature ventricular contractions) 08/21/2011   Willa Rough DPT, ATC Willa Rough 05/17/2018, 9:07 PM  [place] MAIN [facility] SERVICES [address] [place], [place], [address] Phone: [phone]   Fax:  [phone]  Name: Sheila Peters MRN: [id number] Date of Birth: [birth year]

## 2018-05-22 ENCOUNTER — Ambulatory Visit: Payer: Medicare Other

## 2018-05-22 DIAGNOSIS — R293 Abnormal posture: Secondary | ICD-10-CM

## 2018-05-22 DIAGNOSIS — R278 Other lack of coordination: Secondary | ICD-10-CM

## 2018-05-22 DIAGNOSIS — M6281 Muscle weakness (generalized): Secondary | ICD-10-CM | POA: Diagnosis not present

## 2018-05-22 DIAGNOSIS — M533 Sacrococcygeal disorders, not elsewhere classified: Secondary | ICD-10-CM

## 2018-05-22 DIAGNOSIS — M62838 Other muscle spasm: Secondary | ICD-10-CM

## 2018-05-22 NOTE — Patient Instructions (Signed)
   Breathe out as you rotate open and feel the stretch through the front of your chest.

## 2018-05-22 NOTE — Therapy (Addendum)
[place] MAIN [facility] SERVICES [address] [place], [place], [address] Phone: [phone]   Fax:  [phone]  Physical Therapy Treatment  Patient Details  Name: Sheila Peters MRN: [id number] Date of Birth: 1941-02-24 Referring Provider (PT): Tammi Klippel   Encounter Date: 05/22/2018  PT End of Session - 05/22/18 1511    Visit Number  8    Number of Visits  10    Date for PT Re-Evaluation  06/07/18    Authorization Type  Medicare    Authorization - Visit Number  8    Authorization - Number of Visits  10    PT Start Time  1330    PT Stop Time  1430    PT Time Calculation (min)  60 min    Activity Tolerance  Patient tolerated treatment well    Behavior During Therapy  [facility] for tasks assessed/performed       Past Medical History:  Diagnosis Date  . Arthritis   . Asthma   . Clostridium difficile diarrhea   . Colitis, nonspecific   . COPD (chronic obstructive pulmonary disease) ([place])   . GERD (gastroesophageal reflux disease)   . Hypothyroidism    post-surgical  . Multiple thyroid nodules    post excision  . Nephrolithiasis    spontaneously passed  . Nephrolithiasis   . Non-toxic uninodular goiter   . Osteoporosis, postmenopausal   . Ulcerative colitis, chronic ([place])   . Ulcerative pancolitis without complication [facility])     Past Surgical History:  Procedure Laterality Date  . ABDOMINAL HYSTERECTOMY    . ANKLE SURGERY     left fracture  . BACK SURGERY     herniated disk  . COLONOSCOPY N/A 02/26/2017   Procedure: COLONOSCOPY;  Surgeon: Manya Silvas, MD;  Location: [facility] ENDOSCOPY;  Service: Endoscopy;  Laterality: N/A;  . COLONOSCOPY WITH PROPOFOL N/A 11/02/2014   Procedure: COLONOSCOPY WITH PROPOFOL;  Surgeon: Josefine Class, MD;  Location: [facility] ENDOSCOPY;  Service: Endoscopy;  Laterality: N/A;  . excision of thyroid nodule    . 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 JOINT REPLACEMENT     2014 lth    There were no vitals filed for this visit.    Pelvic Floor Physical Therapy Treatment Note  SCREENING  Changes in medications, allergies, or medical history?: none    SUBJECTIVE  Patient reports: Notices had fecal incontinence late last week that would have been worse but she got to the bathroom faster. Can sometimes have a strong urge and be able to hold it ok, this is why she feels like she is doing better.  Precautions:  Osteoporosis and scoliosis  Pain update:  Location of pain: low back Current pain:  0/10  Max pain:  5/10 Least pain:  0/10 Nature of pain: achy, dull  *Has been having pain with forward bend but none following treatment today.   Patient Goals: To be able to go onto a trip without having to have worry about incontinence on her mid 24 hrs. Per day.   OBJECTIVE  Changes in: Posture/Observations:  Pt. Tends to sit on her R buttock rather than straight up.  Range of Motion/Flexibilty:  ~ 10 degree improvement in lumbar rotation in hook-lying B.  Palpation: TTP through L lumbar paraspinals and B multifidus. Sustained decrease in TTP to L lateral hip musculature!  INTERVENTIONS THIS SESSION: Manual: Performed STM and TP release to L lumbar paraspinals and B multifidus through the T-L junction to decrease pain and spasm and allow for improved mobility and decreased pressure on nerves that innervate the pelvis.  Dry-needle: Performed TPDN with a .30x74m needle to L lumbar paraspinals and B multifidus through the T-L junction to decrease pain and spasm and allow for improved mobility and decreased pressure on nerves that innervate the pelvis.  Therex: reviewed lumbar mobility twists for improved performance and maximal efficacy and educated on and practiced open-book in side-lying to improve thoracic mobility and anterior chest tissue  length.    Total time: 60 min.                   Trigger Point Dry Needling - 05/22/18 1508    Consent Given?  Yes    Education Handout Provided  No    Muscles Treated Upper Body  Longissimus    Longissimus Response  --   twich response elicited, palpable lengthening of muscle           PT Education - 05/22/18 1510    Education Details  See Pt. Instructions and interventions this session.    Person(s) Educated  Patient    Methods  Explanation;Demonstration;Verbal cues;Tactile cues;Handout    Comprehension  Verbalized understanding;Returned demonstration;Verbal cues required;Need further instruction;Tactile cues required       PT Short Term Goals - 03/30/18 1815      PT SHORT TERM GOAL #1   Title  Patient will demonstrate a coordinated contraction, relaxation, and bulge of the pelvic floor muscles to demonstrate functional recruitment and motion and allow for further strengthening.    Baseline  History suggests poor PFM coordination    Time  5    Period  Weeks    Status  New    Target Date  05/04/18      PT SHORT TERM GOAL #2   Title  Patient will demonstrate improved pelvic alignment and balance of musculature surrounding the pelvis to facilitate decreased PFM spasms and decrease pelvic pain.    Baseline  Pt. has moderate scoliosis that is impacting her pelvic alignment and encouraging spasms surrounding the pelvis.     Time  5    Period  Weeks    Status  New    Target Date  05/04/18      PT SHORT TERM GOAL #3   Title  Patient will demonstrate HEP x1 in the clinic to demonstrate understanding and proper form to allow for further improvement.    Baseline  Pt. lacks knowledge of apprpriate exercises to improve FI and SUI        PT Long Term Goals - 03/29/18 1425      PT LONG TERM GOAL #1   Title  Patient will score at or below 53/300 on the PFDI and 60/300 on the PFIQ to demonstrate a clinically meaningful decrease in disability and distress due to  pelvic floor dysfunction.    Baseline  PFDI: 98/300, PFIQ: 105/300    Time  10    Period  Weeks    Status  New    Target Date  06/07/18      PT LONG TERM GOAL #2   Title  Patient will score at or below 20/61 of the FISI to demonstrate decreased fecal incontinence and improved quality of life.    Baseline  FISI: 39/61    Time  10    Period  Weeks    Status  New    Target Date  06/07/18      PT LONG TERM GOAL #3   Title  Patient will report no episodes of SUI over the course of the prior two weeks to demonstrate improved functional ability.    Baseline  SUI requirng panty-liner daily    Time  10    Period  Weeks    Status  New    Target Date  06/21/18      PT LONG TERM GOAL #4   Title  Pt. will have the confidence to go on a shopping trip or go to church requiring her to be away from the house for > 2 hours to demonstrate improved participation and quality of life.    Baseline  has greatly reduced her participation due to embarassment and fear of FI.    Time  10    Period  Weeks    Status  New    Target Date  06/07/18            Plan - 05/22/18 1511    Clinical Impression Statement  Pt. responded well to all treatments today, demonstrating decreased spasm, improved ROM, and correct performeance and understanding of all education provided. Continue per POC.    Clinical Presentation  Evolving    Clinical Decision Making  High    PT Frequency  1x / week    PT Duration  Other (comment)    PT Treatment/Interventions  ADLs/Self Care Home Management;Biofeedback;Electrical Stimulation;Moist Heat;Gait training;Stair training;Functional mobility training;Therapeutic activities;Therapeutic exercise;Balance training;Neuromuscular re-education;Manual techniques;Scar mobilization;Passive range of motion;Dry needling;Taping;Spinal Manipulations;Joint Manipulations    PT Next Visit Plan  Discuss DN for back and L hip, biofeedback lying on R side PRN.     PT Home Exercise Plan   Diaphragmatic breathing, hook-lying trunk rotations, pelvic tilts in hook-lying, kegels.    Consulted and Agree with Plan of Care  Patient       Patient will benefit from skilled therapeutic intervention in order to improve the following deficits and impairments:  Increased fascial restricitons, Improper body mechanics, Decreased coordination, Decreased mobility, Increased muscle spasms, Postural dysfunction, Decreased activity tolerance, Decreased endurance, Decreased range of motion, Decreased strength, Impaired perceived functional ability, Decreased balance  Visit Diagnosis: Muscle weakness (generalized)  Other lack of coordination  Sacrococcygeal disorders, not elsewhere classified  Other muscle spasm  Abnormal posture     Problem List Patient Active Problem List   Diagnosis Date Noted  . Swelling of limb 10/10/2016  . Pain in limb 10/10/2016  . Varicose veins of leg with pain, left 10/10/2016  . Abnormal CT scan of lung 10/29/2015  . Personal history of tobacco use, presenting hazards to health 11/04/2014  . Preop cardiovascular exam 09/11/2011  . SOB (shortness of breath) 08/21/2011  . Chest pain 08/21/2011  . PVC's (premature ventricular contractions) 08/21/2011   Willa Rough DPT, ATC Willa Rough 05/23/2018, 5:21 PM  [place] MAIN [facility] SERVICES [address] [place], [place], [address] Phone: [phone]   Fax:  [phone]  Name: ADONAI HELZER MRN: [id number] Date of Birth: 1940-07-14

## 2018-05-30 ENCOUNTER — Ambulatory Visit: Payer: Medicare Other

## 2018-06-06 ENCOUNTER — Ambulatory Visit: Payer: Medicare Other | Attending: Student

## 2018-06-06 DIAGNOSIS — R293 Abnormal posture: Secondary | ICD-10-CM | POA: Insufficient documentation

## 2018-06-06 DIAGNOSIS — M6281 Muscle weakness (generalized): Secondary | ICD-10-CM | POA: Diagnosis not present

## 2018-06-06 DIAGNOSIS — M533 Sacrococcygeal disorders, not elsewhere classified: Secondary | ICD-10-CM | POA: Diagnosis present

## 2018-06-06 DIAGNOSIS — M62838 Other muscle spasm: Secondary | ICD-10-CM | POA: Insufficient documentation

## 2018-06-06 DIAGNOSIS — R278 Other lack of coordination: Secondary | ICD-10-CM | POA: Insufficient documentation

## 2018-06-06 NOTE — Therapy (Addendum)
[place] MAIN [facility] SERVICES [address] [place], [place], [address] Phone: [phone]   Fax:  [phone]  Physical Therapy Treatment  Patient Details  Name: Sheila Peters MRN: [id number] Date of Birth: February 11, 1941 Referring Provider (PT): Tammi Klippel   Encounter Date: 06/06/2018  PT End of Session - 06/10/18 1101    Visit Number  9    Number of Visits  10    Date for PT Re-Evaluation  06/07/18    Authorization Type  Medicare    Authorization - Visit Number  9    Authorization - Number of Visits  10    PT Start Time  6433    PT Stop Time  1630    PT Time Calculation (min)  60 min    Activity Tolerance  Patient tolerated treatment well    Behavior During Therapy  [facility] for tasks assessed/performed       Past Medical History:  Diagnosis Date  . Arthritis   . Asthma   . Clostridium difficile diarrhea   . Colitis, nonspecific   . COPD (chronic obstructive pulmonary disease) ([place])   . GERD (gastroesophageal reflux disease)   . Hypothyroidism    post-surgical  . Multiple thyroid nodules    post excision  . Nephrolithiasis    spontaneously passed  . Nephrolithiasis   . Non-toxic uninodular goiter   . Osteoporosis, postmenopausal   . Ulcerative colitis, chronic ([place])   . 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 ABDOMINAL HYSTERECTOMY    . ANKLE SURGERY     left fracture  . BACK SURGERY     herniated disk  . COLONOSCOPY N/A 02/26/2017   Procedure: COLONOSCOPY;  Surgeon: Manya Silvas, MD;  Location: [facility] ENDOSCOPY;  Service: Endoscopy;  Laterality: N/A;  . COLONOSCOPY WITH PROPOFOL N/A 11/02/2014   Procedure: COLONOSCOPY WITH PROPOFOL;  Surgeon: Josefine Class, MD;  Location: [facility] ENDOSCOPY;  Service: Endoscopy;  Laterality: N/A;  . excision of thyroid nodule    . 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 JOINT REPLACEMENT     2014 lth    There were no vitals filed for this visit.   Pelvic Floor Physical Therapy Treatment Note  SCREENING  Changes in medications, allergies, or medical history?: none    SUBJECTIVE  Patient reports: Feels she is doing well. Feels better, is not going to the bathroom as much, is sleeping better, though not long enough. Has had just a little fecal leakage but not every day. Only has back pain when bending forward. Is able to lay on her L side without pain to do her exercises.  Precautions:  Osteoporosis and scoliosis  Pain update:  Location of pain: low back Current pain:  0/10  Max pain:  5/10 Least pain:  0/10 Nature of pain: achy, dull.  Patient Goals: To be able to go onto a trip without having to have worry about incontinence on her mid 24 hrs. Per day.   OBJECTIVE  Changes in: Posture/Observations:  L Lumbar, R thoracic scoliosis   Range of Motion/Flexibilty:  Pt. Demonstrates improved rotation mobility through the spine but not much improvement in side-bending yet.  *Pt has limited shoulder ROM, making her side-stretch more difficult.  Palpation: Decreased or absent tenderness through L hip!   INTERVENTIONS  THIS SESSION: Therex: educated on and practiced side-stretch, teapot, lateral glides with hip stabilized, chin-tucks, and scapular retractions to improve scoliotic alignment and decrease pressure on nerves that innervate the pelvis and low back for improved function and decreased pain.  Total time: 60 min.                           PT Education - 06/10/18 1100    Education Details  See Pt. Instructions and Interventions this session.    Person(s) Educated  Patient    Methods  Explanation;Demonstration;Verbal cues;Tactile cues;Handout    Comprehension  Verbalized understanding;Returned demonstration;Verbal cues  required;Tactile cues required;Need further instruction       PT Short Term Goals - 03/30/18 1815      PT SHORT TERM GOAL #1   Title  Patient will demonstrate a coordinated contraction, relaxation, and bulge of the pelvic floor muscles to demonstrate functional recruitment and motion and allow for further strengthening.    Baseline  History suggests poor PFM coordination    Time  5    Period  Weeks    Status  New    Target Date  05/04/18      PT SHORT TERM GOAL #2   Title  Patient will demonstrate improved pelvic alignment and balance of musculature surrounding the pelvis to facilitate decreased PFM spasms and decrease pelvic pain.    Baseline  Pt. has moderate scoliosis that is impacting her pelvic alignment and encouraging spasms surrounding the pelvis.     Time  5    Period  Weeks    Status  New    Target Date  05/04/18      PT SHORT TERM GOAL #3   Title  Patient will demonstrate HEP x1 in the clinic to demonstrate understanding and proper form to allow for further improvement.    Baseline  Pt. lacks knowledge of apprpriate exercises to improve FI and SUI        PT Long Term Goals - 03/29/18 1425      PT LONG TERM GOAL #1   Title  Patient will score at or below 53/300 on the PFDI and 60/300 on the PFIQ to demonstrate a clinically meaningful decrease in disability and distress due to pelvic floor dysfunction.    Baseline  PFDI: 98/300, PFIQ: 105/300    Time  10    Period  Weeks    Status  New    Target Date  06/07/18      PT LONG TERM GOAL #2   Title  Patient will score at or below 20/61 of the FISI to demonstrate decreased fecal incontinence and improved quality of life.    Baseline  FISI: 39/61    Time  10    Period  Weeks    Status  New    Target Date  06/07/18      PT LONG TERM GOAL #3   Title  Patient will report no episodes of SUI over the course of the prior two weeks to demonstrate improved functional ability.    Baseline  SUI requirng panty-liner daily     Time  10    Period  Weeks    Status  New    Target Date  06/21/18      PT LONG TERM GOAL #4   Title  Pt. will have the confidence to go on a shopping trip or go to church requiring her to be away from  the house for > 2 hours to demonstrate improved participation and quality of life.    Baseline  has greatly reduced her participation due to embarassment and fear of FI.    Time  10    Period  Weeks    Status  New    Target Date  06/07/18            Plan - 06/10/18 1107    Clinical Impression Statement  Pt. is demonstrating improvement in Sx. and ROM though she continues to demonstrate significant mal-alignment and stiffness that are contributing to continued disfunction. She will benefit from further treatment to improve mobility and strengthen into maximal achievable alignment to allow for continued improvement and prevent return of Sx. Continue per POC.    Clinical Presentation  Evolving    Clinical Decision Making  High    Rehab Potential  Fair    Clinical Impairments Affecting Rehab Potential  complex medical history, age, scoliosis, osteoporosis    PT Frequency  1x / week    PT Duration  Other (comment)   10 weeks   PT Treatment/Interventions  ADLs/Self Care Home Management;Biofeedback;Electrical Stimulation;Moist Heat;Gait training;Stair training;Functional mobility training;Therapeutic activities;Therapeutic exercise;Balance training;Neuromuscular re-education;Manual techniques;Scar mobilization;Passive range of motion;Dry needling;Taping;Spinal Manipulations;Joint Manipulations    PT Next Visit Plan  Review HEP, DN for hip-flexors and QL?, review side-stretch, biofeedback lying on R side PRN.     PT Home Exercise Plan  Diaphragmatic breathing, hook-lying trunk rotations, pelvic tilts in hook-lying, kegels, open-book, tea-pot, scapulare retractions, chin-tucks, side-stretch, lateral glides, .    Consulted and Agree with Plan of Care  Patient       Patient will benefit from  skilled therapeutic intervention in order to improve the following deficits and impairments:  Increased fascial restricitons, Improper body mechanics, Decreased coordination, Decreased mobility, Increased muscle spasms, Postural dysfunction, Decreased activity tolerance, Decreased endurance, Decreased range of motion, Decreased strength, Impaired perceived functional ability, Decreased balance  Visit Diagnosis: Muscle weakness (generalized)  Other lack of coordination  Sacrococcygeal disorders, not elsewhere classified  Other muscle spasm  Abnormal posture     Problem List Patient Active Problem List   Diagnosis Date Noted  . Swelling of limb 10/10/2016  . Pain in limb 10/10/2016  . Varicose veins of leg with pain, left 10/10/2016  . Abnormal CT scan of lung 10/29/2015  . Personal history of tobacco use, presenting hazards to health 11/04/2014  . Preop cardiovascular exam 09/11/2011  . SOB (shortness of breath) 08/21/2011  . Chest pain 08/21/2011  . PVC's (premature ventricular contractions) 08/21/2011   Willa Rough DPT, ATC Willa Rough 06/10/2018, 11:17 AM  [place] MAIN [facility] SERVICES [address] [place], [place], [address] Phone: [phone]   Fax:  [phone]  Name: Sheila Peters MRN: [id number] Date of Birth: [birth year]

## 2018-06-06 NOTE — Patient Instructions (Signed)
     Exhale and side-bend just through your upper back, keeping hips still and arm at the same angle. Keep your left arm against the door the whole time. Do 2x10   Shoulder Retraction and Downward Rotation   Rotate the shoulder blades back and down as if you had to hold a pencil between them, holding for 1 full second each time. Repeat this _10x3_ times _1-2_ times per day.      Start with Shoulder Retraction and Downward Rotation pictures above, holding the position while pulling the chin straight back as if trying to make a "double chin".  Breathe in forward and breathe out as you pull back, repeating this _10x3__ times __1-2__ times per day. ( alternate with shoulder retractions)  Side Stretch, Standing Bend    Stand, one hand on opposite elbow, above head. Bend to one side. Hold for 5 breaths, repeat 3 times for each side.

## 2018-06-13 ENCOUNTER — Ambulatory Visit: Payer: Medicare Other

## 2018-06-19 ENCOUNTER — Ambulatory Visit: Payer: Medicare Other

## 2018-06-19 DIAGNOSIS — M6281 Muscle weakness (generalized): Secondary | ICD-10-CM | POA: Diagnosis not present

## 2018-06-19 DIAGNOSIS — M533 Sacrococcygeal disorders, not elsewhere classified: Secondary | ICD-10-CM

## 2018-06-19 DIAGNOSIS — M62838 Other muscle spasm: Secondary | ICD-10-CM

## 2018-06-19 DIAGNOSIS — R278 Other lack of coordination: Secondary | ICD-10-CM

## 2018-06-19 DIAGNOSIS — R293 Abnormal posture: Secondary | ICD-10-CM

## 2018-06-19 NOTE — Patient Instructions (Signed)
Bowel retraining program: 1) Start by drinking a hot (optionally caffeinated) beverage 2) Do your "I love you" colonic massage  3) Go for a short walk 4) Go to the toilet and sit with feet up on squatty potty and relaxing forward on your knees with tall spine. Take deep, lengthening, breaths and allow up to 10 minutes to have a BM without "straining" before you move on with the day.    The "I Love You" massage for your colon  Start by resting or lying quietly. 1. Using your fingertips, you apply light pressure in a stroking motion. 2. Start with your hands on the left hand side of your abdomen, below the rib cage, and stroke or make small circles down towards your left hip. This is the "I" of the "I Love You" massage. 3. Next, you are going to make the strokes in an upside down "L" shape. Run your fingertips from the right side of your upper abdomen, across under your ribs, and down the left side. 4. Now you are going to run through the whole path. This is the "U". Start on the bottom right of your abdomen. Stroke up the right side, across under the rib cage, and down the left side.   5. Finally, Using your fingertips, you apply light pressure in small circles  through the whole "U" path to "wake up" the smooth muscles of the intestines and get things moving.    Up the right, across under the rib cage, down the left and inwards, moving in a clockwise motion (if you are looking down upon your own abdomen)  Essentially you are massaging along the path of your large intestine. Our colon starts roughly in the bottom right of our abdomen, travels up the right hand side, turns and runs across below our rib cage, and then down the left side and in towards the pubic bone. When I teach this massage for people to do at home I have them start with 10 minutes. However, anecdotally, many people tell me 15-20 minutes really gets things going! After about 5 minutes of this massage my insides start gurgling  and making noises. 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 A big problem is constipation resulting from either medication side effects, post surgical changes, or the fact that in general people in the hospital don't move as much (and exercise such as walking also helps regulate our digestive system). One of the first "exercises" I would teach them is how to do the "I Love You" abdominal massage. Time and time again I have people come back to me and say that massaging their abdominal tissue helped their digestive issues. Give it a try today!  *Adapted from article written by Gwenlyn Perking, PT, DPT

## 2018-06-19 NOTE — Therapy (Signed)
[place] MAIN [facility] SERVICES [address] [place], [place], [address] Phone: [phone]   Fax:  [phone]  Physical Therapy Progress Note   Dates of reporting period  03/28/2018  to   06/19/2018   Patient Details  Name: Sheila Peters MRN: [id number] Date of Birth: [birth year] Referring Provider (PT): Tammi Klippel   Encounter Date: 06/19/2018  PT End of Session - 06/19/18 1404    Visit Number  10    Number of Visits  10    Date for PT Re-Evaluation  06/07/18    Authorization Type  Medicare    Authorization - Visit Number  10    Authorization - Number of Visits  10    PT Start Time  1330    PT Stop Time  1430    PT Time Calculation (min)  60 min    Activity Tolerance  Patient tolerated treatment well    Behavior During Therapy  [facility] for tasks assessed/performed       Past Medical History:  Diagnosis Date  . Arthritis   . Asthma   . Clostridium difficile diarrhea   . Colitis, nonspecific   . COPD (chronic obstructive pulmonary disease) ([place])   . GERD (gastroesophageal reflux disease)   . Hypothyroidism    post-surgical  . Multiple thyroid nodules    post excision  . Nephrolithiasis    spontaneously passed  . Nephrolithiasis   . Non-toxic uninodular goiter   . Osteoporosis, postmenopausal   . Ulcerative colitis, chronic ([place])   . Ulcerative pancolitis without complication [facility])     Past Surgical History:  Procedure Laterality Date  . ABDOMINAL HYSTERECTOMY    . ANKLE SURGERY     left fracture  . BACK SURGERY     herniated disk  . COLONOSCOPY N/A 02/26/2017   Procedure: COLONOSCOPY;  Surgeon: Manya Silvas, MD;  Location: [facility] ENDOSCOPY;  Service: Endoscopy;  Laterality: N/A;  . COLONOSCOPY WITH PROPOFOL N/A 11/02/2014   Procedure: COLONOSCOPY WITH PROPOFOL;  Surgeon: Josefine Class, MD;  Location[facility] ENDOSCOPY;  Service: Endoscopy;  Laterality: N/A;  . excision of thyroid nodule    . FECAL TRANSPLANT  N/A 02/26/2017   Procedure: FECAL TRANSPLANT;  Surgeon: Manya Silvas, MD;  Location: [facility] ENDOSCOPY;  Service: Endoscopy;  Laterality: N/A;  . JOINT REPLACEMENT     2014 lth    There were no vitals filed for this visit.    Pelvic Floor Physical Therapy Re-assessment and Progress Note  SCREENING  Changes in medications, allergies, or medical history?: none   SUBJECTIVE  Patient reports: Had some leakage this weekend, just mucous but has noticed that this seems   Precautions:  Osteoporosis and scoliosis  Pain update: Only arthritis pain, mostly when it is cold or in the mornings.  Patient Goals: To be able to go onto a trip without having to have worry about incontinence on her mid 24 hrs. Per day.   OBJECTIVE  Changes in: Posture/Observations:  Continued moderate scoliosis with minimal mobility.  Range of Motion/Flexibilty:  Pt. Demonstrated significantly improved cervical retraction ROM and ability to engage scapular retractors with exercise but minimal thoracic extension. Minimal L thoracic rotation.  INTERVENTIONS THIS SESSION: Self-care: reviewed diet and educated on the importance of having protein as a regular part of her diet rather than occasional to improve consistency of BMs, discussed the importance of being patient with the urge-suppression technique to allow it to work and build her confidence in  her ability to delay so she can increase participation. Educated on and performed the colonic massage and bowel-retraining program to encourage full emptying at one time and decrease frequency of BM's to allow Pt. To return to church. Therex: Reviewed new HEP exercises from last visit and provided cueing for optimal performance to allow for improved mobility and decreased pressure on nerve roots that innervate the PFM.  Total time: 60 min.                            PT Short Term Goals - 06/19/18 1448      PT SHORT TERM GOAL #1    Title  Patient will demonstrate a coordinated contraction, relaxation, and bulge of the pelvic floor muscles to demonstrate functional recruitment and motion and allow for further strengthening. Patient's condition has the potential to improve in response to therapy. Continue for 10 more visits at 1x/week. Maximum improvement is yet to be obtained. The anticipated improvement is attainable and reasonable in a generally predictable time.     Baseline  History suggests poor PFM coordination    Time  5    Period  Weeks    Status  Achieved    Target Date  05/04/18      PT SHORT TERM GOAL #2   Title  Patient will demonstrate improved pelvic alignment and balance of musculature surrounding the pelvis to facilitate decreased PFM spasms and decrease pelvic pain.    Baseline  Pt. has moderate scoliosis that is impacting her pelvic alignment and encouraging spasms surrounding the pelvis.     Time  5    Period  Weeks    Status  Achieved    Target Date  05/04/18      PT SHORT TERM GOAL #3   Title  Patient will demonstrate HEP x1 in the clinic to demonstrate understanding and proper form to allow for further improvement.    Baseline  Pt. lacks knowledge of apprpriate exercises to improve FI and SUI. As of 06/19/2018: Pt. demonstrates appropriate performance of ~75% of her HEP without cueing but needs mod cueing for the others.    Time  10    Period  Weeks    Status  On-going    Target Date  08/28/18        PT Long Term Goals - 06/19/18 1340      PT LONG TERM GOAL #1   Title  Patient will score at or below 53/300 on the PFDI and 60/300 on the PFIQ to demonstrate a clinically meaningful decrease in disability and distress due to pelvic floor dysfunction.    Baseline  PFDI: 98/300, PFIQ: 105/300  As of 06/19/2018: PFDI: 33/300 PFIQ: 62/300    Time  10    Period  Weeks    Status  On-going    Target Date  08/28/18      PT LONG TERM GOAL #2   Title  Patient will score at or below 20/61 of the FISI  to demonstrate decreased fecal incontinence and improved quality of life.    Baseline  FISI: 39/61, As of 06/19/2018: 11/61    Time  10    Period  Weeks    Status  Achieved    Target Date  06/07/18      PT LONG TERM GOAL #3   Title  Patient will report no episodes of SUI over the course of the prior two weeks to demonstrate improved  functional ability.    Baseline  SUI requirng panty-liner daily    Time  10    Period  Weeks    Status  Achieved    Target Date  06/21/18      PT LONG TERM GOAL #4   Title  Pt. will have the confidence to go on a shopping trip or go to church requiring her to be away from the house for > 2 hours to demonstrate improved participation and quality of life.    Baseline  has greatly reduced her participation due to embarassment and fear of FI. As of 06/09/18: is ok in the afternoon, still having 4 BM's in the morning and     Time  10    Period  Weeks    Status  On-going    Target Date  08/28/18            Plan - 06/19/18 1506    Clinical Impression Statement  The patient is demonstrating significant improvement, having met 50% of her goals and made great progress toward the remaining goals. She has only had some smearing one day over the past two weeks, with no complete loss of BM or SUI and has decreased her bathroom trips from 6-7 each morning to ~ 4. She now feels confident leaving the house after noon for more than 2 hours but is still not confident enough to return to church because she is consistently having to have a BM in the late morning which would be during the service. She will continue to benefit from skilled pelvic health PT to further improve mobility and alignment of the spine and allow for maximal capacity of the diaphragm and pelvic floor muscles to function through a full ROM in a coordinated fashion. Continue for 10 more visits at 1x/week.     Clinical Presentation  Evolving    Clinical Decision Making  High    Rehab Potential  Fair     Clinical Impairments Affecting Rehab Potential  complex medical history, age, scoliosis, osteoporosis    PT Frequency  1x / week    PT Duration  Other (comment)   10 weeks   PT Treatment/Interventions  ADLs/Self Care Home Management;Biofeedback;Electrical Stimulation;Moist Heat;Gait training;Stair training;Functional mobility training;Therapeutic activities;Therapeutic exercise;Balance training;Neuromuscular re-education;Manual techniques;Scar mobilization;Passive range of motion;Dry needling;Taping;Spinal Manipulations;Joint Manipulations    PT Next Visit Plan  rhythmic motions/dancing/tai chi? review lateral shifts and consider trading for hip-shift against wall. myofascial mobility and gentle manual for overall mobility, heat, biofeedback lying on R side PRN.     PT Home Exercise Plan  Diaphragmatic breathing, hook-lying trunk rotations, pelvic tilts in hook-lying, kegels, open-book, tea-pot, scapulare retractions, chin-tucks, side-stretch, lateral glides, .    Consulted and Agree with Plan of Care  Patient       Patient will benefit from skilled therapeutic intervention in order to improve the following deficits and impairments:  Increased fascial restricitons, Improper body mechanics, Decreased coordination, Decreased mobility, Increased muscle spasms, Postural dysfunction, Decreased activity tolerance, Decreased endurance, Decreased range of motion, Decreased strength, Impaired perceived functional ability, Decreased balance  Visit Diagnosis: Muscle weakness (generalized)  Other lack of coordination  Sacrococcygeal disorders, not elsewhere classified  Other muscle spasm  Abnormal posture     Problem List Patient Active Problem List   Diagnosis Date Noted  . Swelling of limb 10/10/2016  . Pain in limb 10/10/2016  . Varicose veins of leg with pain, left 10/10/2016  . Abnormal CT scan of lung 10/29/2015  .  Personal history of tobacco use, presenting hazards to health 11/04/2014   . Preop cardiovascular exam 09/11/2011  . SOB (shortness of breath) 08/21/2011  . Chest pain 08/21/2011  . PVC's (premature ventricular contractions) 08/21/2011   Willa Rough DPT, ATC Willa Rough 06/20/2018, 8:49 AM  [place] MAIN [facility] SERVICES [address] [place], [place], [address] Phone: [phone]   Fax:  [phone]  Name: Sheila Peters MRN: [id number] Date of Birth: 06-Apr-1941

## 2018-06-25 ENCOUNTER — Ambulatory Visit: Payer: Medicare Other | Attending: Student | Admitting: Physical Therapy

## 2018-06-25 DIAGNOSIS — M533 Sacrococcygeal disorders, not elsewhere classified: Secondary | ICD-10-CM | POA: Insufficient documentation

## 2018-06-25 DIAGNOSIS — M6281 Muscle weakness (generalized): Secondary | ICD-10-CM | POA: Diagnosis not present

## 2018-06-25 DIAGNOSIS — M62838 Other muscle spasm: Secondary | ICD-10-CM | POA: Diagnosis present

## 2018-06-25 DIAGNOSIS — R293 Abnormal posture: Secondary | ICD-10-CM | POA: Diagnosis present

## 2018-06-25 DIAGNOSIS — R278 Other lack of coordination: Secondary | ICD-10-CM | POA: Insufficient documentation

## 2018-06-25 NOTE — Therapy (Signed)
[place] MAIN [facility] SERVICES [address] [place], [place], [address] Phone: [phone]   Fax:  [phone]  Physical Therapy Treatment  Patient Details  Name: Sheila Peters MRN: [id number] Date of Birth: [birth year] Referring Provider (PT): Tammi Klippel   Encounter Date: 06/25/2018  PT End of Session - 06/25/18 1308    Visit Number  11    Number of Visits  20    Date for PT Re-Evaluation  06/07/18    Authorization Type  Medicare    Authorization - Visit Number  1    Authorization - Number of Visits  10    PT Start Time  1301    PT Stop Time  8341    PT Time Calculation (min)  58 min    Activity Tolerance  Patient tolerated treatment well    Behavior During Therapy  [facility] for tasks assessed/performed       Past Medical History:  Diagnosis Date  . Arthritis   . Asthma   . Clostridium difficile diarrhea   . Colitis, nonspecific   . COPD (chronic obstructive pulmonary disease) ([place])   . GERD (gastroesophageal reflux disease)   . Hypothyroidism    post-surgical  . Multiple thyroid nodules    post excision  . Nephrolithiasis    spontaneously passed  . Nephrolithiasis   . Non-toxic uninodular goiter   . Osteoporosis, postmenopausal   . Ulcerative colitis, chronic ([place])   . Ulcerative pancolitis without complication [facility])     Past Surgical History:  Procedure Laterality Date  . ABDOMINAL HYSTERECTOMY    . ANKLE SURGERY     left fracture  . BACK SURGERY     herniated disk  . COLONOSCOPY N/A 02/26/2017   Procedure: COLONOSCOPY;  Surgeon: Manya Silvas, MD;  Location: [facility] ENDOSCOPY;  Service: Endoscopy;  Laterality: N/A;  . COLONOSCOPY WITH PROPOFOL N/A 11/02/2014   Procedure: COLONOSCOPY WITH PROPOFOL;  Surgeon: Josefine Class, MD;  Location: [facility] ENDOSCOPY;  Service: Endoscopy;  Laterality: N/A;  . excision of thyroid nodule    . 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 JOINT REPLACEMENT     2014 lth    There were no vitals filed for this visit.  Subjective Assessment - 06/25/18 1302    Subjective  Patient reports that her arthritis isn't bothering her that much. Patient denies any significant changes since her last visit. Patient is excited to report that she wents to church on Sunday. Patient had remicaid on Thursday and had the opportunity to build confidence being away from the house and not rushing to the bathroom frequently. Patient also states that she adjusted her food intake in the morning, which seemed to help as well.     Currently in Pain?  No/denies       TREATMENT  Pre-treatment assessment: R cervical side flexion: 12 degrees, R cervical rotation: 36 degrees. L cervical side flexion 9 degrees, L cervical rotation: 38 degrees. No increased pain with movement. Multiple TP noted throughout trapezius and scalenes, R>L.   Manual Therapy: Supine TPR subocciptals, B upper trap, B scalenes for improved cervical and thoracic mobility.  Neuromuscular Re-education: Supine hooklying, BLE rotations Supine hooklying, cervical retractions Seated, L upward chop with R side flexion to lengthen shortened L side 2/2 to scoliosis  Post-treatment assessment: decreased muscular tension in cervical musculature.  Patient educated  throughout session on appropriate technique and form using multi-modal cueing, HEP, and activity modification. Patient articulated understanding and returned demonstration.  Patient Response to interventions: Patient reported no pain increased and stated higher confidence for compliance with HEP.  ASSESSMENT Patient presents to clinic with excellent motivation to participate in therapy. Patient demonstrates deficits in posture, strength, and ROM as evidenced by severe scoliosis, reliance on verbal cues for maintaining correct form on exercises without  compensatory patterns. Patient able to achieve improved pain-free cervical range of motion during today's session and responded positively to manual interventions. Patient will benefit from continued skilled therapeutic intervention to address remaining deficits in posture, strength, ROM, PFM coordination in order to decrease incidence of fecal incontinence and urgency, increase function, and improve overall QOL.      PT Short Term Goals - 06/19/18 1448      PT SHORT TERM GOAL #1   Title  Patient will demonstrate a coordinated contraction, relaxation, and bulge of the pelvic floor muscles to demonstrate functional recruitment and motion and allow for further strengthening.    Baseline  History suggests poor PFM coordination    Time  5    Period  Weeks    Status  Achieved    Target Date  05/04/18      PT SHORT TERM GOAL #2   Title  Patient will demonstrate improved pelvic alignment and balance of musculature surrounding the pelvis to facilitate decreased PFM spasms and decrease pelvic pain.    Baseline  Pt. has moderate scoliosis that is impacting her pelvic alignment and encouraging spasms surrounding the pelvis.     Time  5    Period  Weeks    Status  Achieved    Target Date  05/04/18      PT SHORT TERM GOAL #3   Title  Patient will demonstrate HEP x1 in the clinic to demonstrate understanding and proper form to allow for further improvement.    Baseline  Pt. lacks knowledge of apprpriate exercises to improve FI and SUI. As of 06/19/2018: Pt. demonstrates appropriate performance of ~75% of her HEP without cueing but needs mod cueing for the others.    Time  10    Period  Weeks    Status  On-going    Target Date  08/28/18        PT Long Term Goals - 06/19/18 1340      PT LONG TERM GOAL #1   Title  Patient will score at or below 53/300 on the PFDI and 60/300 on the PFIQ to demonstrate a clinically meaningful decrease in disability and distress due to pelvic floor dysfunction.     Baseline  PFDI: 98/300, PFIQ: 105/300  As of 06/19/2018: PFDI: 33/300 PFIQ: 62/300    Time  10    Period  Weeks    Status  On-going    Target Date  08/28/18      PT LONG TERM GOAL #2   Title  Patient will score at or below 20/61 of the FISI to demonstrate decreased fecal incontinence and improved quality of life.    Baseline  FISI: 39/61, As of 06/19/2018: 11/61    Time  10    Period  Weeks    Status  Achieved    Target Date  06/07/18      PT LONG TERM GOAL #3   Title  Patient will report no episodes of SUI over the course of the prior two weeks to demonstrate improved functional ability.  Baseline  SUI requirng panty-liner daily    Time  10    Period  Weeks    Status  Achieved    Target Date  06/21/18      PT LONG TERM GOAL #4   Title  Pt. will have the confidence to go on a shopping trip or go to church requiring her to be away from the house for > 2 hours to demonstrate improved participation and quality of life.    Baseline  has greatly reduced her participation due to embarassment and fear of FI. As of 06/09/18: is ok in the afternoon, still having 4 BM's in the morning and     Time  10    Period  Weeks    Status  On-going    Target Date  08/28/18            Plan - 06/26/18 1522    Clinical Impression Statement  Patient presents to clinic with excellent motivation to participate in therapy. Patient demonstrates deficits in posture, strength, and ROM as evidenced by severe scoliosis, reliance on verbal cues for maintaining correct form on exercises without compensatory patterns. Patient able to achieve improved pain-free cervical range of motion during today's session and responded positively to manual interventions. Patient will benefit from continued skilled therapeutic intervention to address remaining deficits in posture, strength, ROM, PFM coordination in order to decrease incidence of fecal incontinence and urgency, increase function, and improve overall QOL.    Rehab  Potential  Fair    Clinical Impairments Affecting Rehab Potential  complex medical history, age, scoliosis, osteoporosis    PT Frequency  1x / week    PT Duration  Other (comment)   10 weeks   PT Treatment/Interventions  ADLs/Self Care Home Management;Biofeedback;Electrical Stimulation;Moist Heat;Gait training;Stair training;Functional mobility training;Therapeutic activities;Therapeutic exercise;Balance training;Neuromuscular re-education;Manual techniques;Scar mobilization;Passive range of motion;Dry needling;Taping;Spinal Manipulations;Joint Manipulations    PT Next Visit Plan  rhythmic motions/dancing/tai chi? review lateral shifts and consider trading for hip-shift against wall. myofascial mobility and gentle manual for overall mobility, heat, biofeedback lying on R side PRN.     PT Home Exercise Plan  Diaphragmatic breathing, hook-lying trunk rotations, pelvic tilts in hook-lying, kegels, open-book, tea-pot, scapulare retractions, chin-tucks, side-stretch, lateral glides, .    Consulted and Agree with Plan of Care  Patient       Patient will benefit from skilled therapeutic intervention in order to improve the following deficits and impairments:  Increased fascial restricitons, Improper body mechanics, Decreased coordination, Decreased mobility, Increased muscle spasms, Postural dysfunction, Decreased activity tolerance, Decreased endurance, Decreased range of motion, Decreased strength, Impaired perceived functional ability, Decreased balance  Visit Diagnosis: Muscle weakness (generalized)  Other lack of coordination  Sacrococcygeal disorders, not elsewhere classified  Other muscle spasm  Abnormal posture     Problem List Patient Active Problem List   Diagnosis Date Noted  . Swelling of limb 10/10/2016  . Pain in limb 10/10/2016  . Varicose veins of leg with pain, left 10/10/2016  . Abnormal CT scan of lung 10/29/2015  . Personal history of tobacco use, presenting hazards to  health 11/04/2014  . Preop cardiovascular exam 09/11/2011  . SOB (shortness of breath) 08/21/2011  . Chest pain 08/21/2011  . PVC's (premature ventricular contractions) 08/21/2011   Myles Gip PT, DPT [phone] 06/26/2018, 3:23 PM  [place] MAIN [facility] SERVICES [address] [place], [place], [address] Phone: [phone]   Fax:  [phone]  Name: Sheila Peters MRN:  [id number] Date of Birth: [birth year]

## 2018-07-03 ENCOUNTER — Other Ambulatory Visit: Payer: Self-pay

## 2018-07-03 ENCOUNTER — Ambulatory Visit: Payer: Medicare Other | Admitting: Physical Therapy

## 2018-07-03 ENCOUNTER — Encounter: Payer: Self-pay | Admitting: Physical Therapy

## 2018-07-03 DIAGNOSIS — M62838 Other muscle spasm: Secondary | ICD-10-CM

## 2018-07-03 DIAGNOSIS — R278 Other lack of coordination: Secondary | ICD-10-CM

## 2018-07-03 DIAGNOSIS — M533 Sacrococcygeal disorders, not elsewhere classified: Secondary | ICD-10-CM

## 2018-07-03 DIAGNOSIS — R293 Abnormal posture: Secondary | ICD-10-CM

## 2018-07-03 DIAGNOSIS — M6281 Muscle weakness (generalized): Secondary | ICD-10-CM | POA: Diagnosis not present

## 2018-07-03 NOTE — Therapy (Signed)
[place] MAIN [facility] SERVICES [address] [place], [place], [address] Phone: [phone]   Fax:  [phone]  Physical Therapy Treatment  Patient Details  Name: Sheila Peters MRN: [id number] Date of Birth: 1940/05/12 Referring Provider (PT): Tammi Klippel   Encounter Date: 07/03/2018  PT End of Session - 07/03/18 1253    Visit Number  12    Number of Visits  20    Date for PT Re-Evaluation  06/07/18    Authorization Type  Medicare    Authorization - Visit Number  2    Authorization - Number of Visits  10    PT Start Time  7616    PT Stop Time  1352    PT Time Calculation (min)  54 min    Activity Tolerance  Patient tolerated treatment well    Behavior During Therapy  [facility] for tasks assessed/performed       Past Medical History:  Diagnosis Date  . Arthritis   . Asthma   . Clostridium difficile diarrhea   . Colitis, nonspecific   . COPD (chronic obstructive pulmonary disease) ([place])   . GERD (gastroesophageal reflux disease)   . Hypothyroidism    post-surgical  . Multiple thyroid nodules    post excision  . Nephrolithiasis    spontaneously passed  . Nephrolithiasis   . Non-toxic uninodular goiter   . Osteoporosis, postmenopausal   . Ulcerative colitis, chronic ([place])   . Ulcerative pancolitis without complication [facility])     Past Surgical History:  Procedure Laterality Date  . ABDOMINAL HYSTERECTOMY    . ANKLE SURGERY     left fracture  . BACK SURGERY     herniated disk  . COLONOSCOPY N/A 02/26/2017   Procedure: COLONOSCOPY;  Surgeon: Manya Silvas, MD;  Location: [facility] ENDOSCOPY;  Service: Endoscopy;  Laterality: N/A;  . COLONOSCOPY WITH PROPOFOL N/A 11/02/2014   Procedure: COLONOSCOPY WITH PROPOFOL;  Surgeon: Josefine Class, MD;  Location: [facility] ENDOSCOPY;  Service: Endoscopy;  Laterality: N/A;  . excision of thyroid nodule    . 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 JOINT REPLACEMENT     2014 lth    There were no vitals filed for this visit.  Subjective Assessment - 07/03/18 1259    Subjective  Patient states that she is concerned about coronavirus. Patient had a near fall last night when her dog pulled on leash. She also became short of breath with the incident. Patient states that she remembered to breathe from your diaphragm to help you calm down and catch your breath. Patient was able to go to a funeral and to church this past weekend without issues with IBS. Patient states that she continues to have 3-4 BMs in the morning, occasionally starting in the night. She notes that she has better control of storing feces until she can get to a restroom.   (Pended)     Currently in Pain?  No/denies       TREATMENT  Pre-treatment assessment: increased tone in L upper trapezius, L SCM, L periscapular area  Manual Therapy: TPR to L UT, L SCM, and L periscapular area  Neuromuscular Re-education: Supine diaphragmatic breathing for improved coordination with PFM contractions as well as down reulation of the nervous system. Supine PFM endurance holds with TCs. 6 sec x3, 5 sec x8 Supine chin tucks 5  sec hold x10 Patient educated on progression of postural HEP as well as PFM HEP. Patient also educated on normal PFM function for bowel emptying.    Post-treatment assessment: Tone visibly decreased on L upper trapezius, L SCM, L periscapular area  Patient educated throughout session on appropriate technique and form using multi-modal cueing, HEP, and activity modification. Patient articulated understanding and returned demonstration.  Patient Response to interventions: Patient reported significant reduction in tenderness to LUE and back musculature and feeling much more relaxed.  ASSESSMENT Patient presents to clinic with excellent motivation to participate in therapy. Patient  demonstrates deficits in PFM strength, posture, and tone as evidenced by limited ability to increase duration of endurance PFM contraction, increased tone in L upper back and neck, and continued scoliotic curvature. Patient able to achieve decreased L upper back and neck tension during today's session and responded positively to manual interventions. Patient will benefit from continued skilled therapeutic intervention to address remaining deficits in PFM strength, posture, and tone in order to decrease incidence of fecal incontinence, increase function, and improve overall QOL.     PT Short Term Goals - 06/19/18 1448      PT SHORT TERM GOAL #1   Title  Patient will demonstrate a coordinated contraction, relaxation, and bulge of the pelvic floor muscles to demonstrate functional recruitment and motion and allow for further strengthening.    Baseline  History suggests poor PFM coordination    Time  5    Period  Weeks    Status  Achieved    Target Date  05/04/18      PT SHORT TERM GOAL #2   Title  Patient will demonstrate improved pelvic alignment and balance of musculature surrounding the pelvis to facilitate decreased PFM spasms and decrease pelvic pain.    Baseline  Pt. has moderate scoliosis that is impacting her pelvic alignment and encouraging spasms surrounding the pelvis.     Time  5    Period  Weeks    Status  Achieved    Target Date  05/04/18      PT SHORT TERM GOAL #3   Title  Patient will demonstrate HEP x1 in the clinic to demonstrate understanding and proper form to allow for further improvement.    Baseline  Pt. lacks knowledge of apprpriate exercises to improve FI and SUI. As of 06/19/2018: Pt. demonstrates appropriate performance of ~75% of her HEP without cueing but needs mod cueing for the others.    Time  10    Period  Weeks    Status  On-going    Target Date  08/28/18        PT Long Term Goals - 06/19/18 1340      PT LONG TERM GOAL #1   Title  Patient will score  at or below 53/300 on the PFDI and 60/300 on the PFIQ to demonstrate a clinically meaningful decrease in disability and distress due to pelvic floor dysfunction.    Baseline  PFDI: 98/300, PFIQ: 105/300  As of 06/19/2018: PFDI: 33/300 PFIQ: 62/300    Time  10    Period  Weeks    Status  On-going    Target Date  08/28/18      PT LONG TERM GOAL #2   Title  Patient will score at or below 20/61 of the FISI to demonstrate decreased fecal incontinence and improved quality of life.    Baseline  FISI: 39/61, As of 06/19/2018: 11/61    Time  10  Period  Weeks    Status  Achieved    Target Date  06/07/18      PT LONG TERM GOAL #3   Title  Patient will report no episodes of SUI over the course of the prior two weeks to demonstrate improved functional ability.    Baseline  SUI requirng panty-liner daily    Time  10    Period  Weeks    Status  Achieved    Target Date  06/21/18      PT LONG TERM GOAL #4   Title  Pt. will have the confidence to go on a shopping trip or go to church requiring her to be away from the house for > 2 hours to demonstrate improved participation and quality of life.    Baseline  has greatly reduced her participation due to embarassment and fear of FI. As of 06/09/18: is ok in the afternoon, still having 4 BM's in the morning and     Time  10    Period  Weeks    Status  On-going    Target Date  08/28/18            Plan - 07/03/18 1431    Clinical Impression Statement  Patient presents to clinic with excellent motivation to participate in therapy. Patient demonstrates deficits in PFM strength, posture, and tone as evidenced by limited ability to increase duration of endurance PFM contraction, increased tone in L upper back and neck, and continued scoliotic curvature. Patient able to achieve decreased L upper back and neck tension during today's session and responded positively to manual interventions. Patient will benefit from continued skilled therapeutic intervention  to address remaining deficits in PFM strength, posture, and tone in order to decrease incidence of fecal incontinence, increase function, and improve overall QOL.    Rehab Potential  Fair    Clinical Impairments Affecting Rehab Potential  complex medical history, age, scoliosis, osteoporosis    PT Frequency  1x / week    PT Duration  Other (comment)   10 weeks   PT Treatment/Interventions  ADLs/Self Care Home Management;Biofeedback;Electrical Stimulation;Moist Heat;Gait training;Stair training;Functional mobility training;Therapeutic activities;Therapeutic exercise;Balance training;Neuromuscular re-education;Manual techniques;Scar mobilization;Passive range of motion;Dry needling;Taping;Spinal Manipulations;Joint Manipulations    PT Next Visit Plan  incorporate strengthening into ADLs    PT Home Exercise Plan  Diaphragmatic breathing, hook-lying trunk rotations, pelvic tilts in hook-lying, kegels, open-book, tea-pot, scapulare retractions, chin-tucks, side-stretch, lateral glides, .    Consulted and Agree with Plan of Care  Patient       Patient will benefit from skilled therapeutic intervention in order to improve the following deficits and impairments:  Increased fascial restricitons, Improper body mechanics, Decreased coordination, Decreased mobility, Increased muscle spasms, Postural dysfunction, Decreased activity tolerance, Decreased endurance, Decreased range of motion, Decreased strength, Impaired perceived functional ability, Decreased balance  Visit Diagnosis: Muscle weakness (generalized)  Other lack of coordination  Sacrococcygeal disorders, not elsewhere classified  Other muscle spasm  Abnormal posture     Problem List Patient Active Problem List   Diagnosis Date Noted  . Swelling of limb 10/10/2016  . Pain in limb 10/10/2016  . Varicose veins of leg with pain, left 10/10/2016  . Abnormal CT scan of lung 10/29/2015  . Personal history of tobacco use, presenting  hazards to health 11/04/2014  . Preop cardiovascular exam 09/11/2011  . SOB (shortness of breath) 08/21/2011  . Chest pain 08/21/2011  . PVC's (premature ventricular contractions) 08/21/2011   Myles Gip PT, DPT #  [id number] 07/03/2018, 2:31 PM  [place] MAIN [facility] SERVICES [address] [place], [place], [address] Phone: [phone]   Fax:  [phone]  Name: Sheila Peters MRN: [id number] Date of Birth: 1940-09-26

## 2018-07-10 ENCOUNTER — Ambulatory Visit: Payer: Medicare Other

## 2018-07-11 ENCOUNTER — Encounter: Payer: Self-pay | Admitting: *Deleted

## 2018-07-13 ENCOUNTER — Encounter: Payer: Self-pay | Admitting: *Deleted

## 2018-07-15 ENCOUNTER — Encounter: Payer: Self-pay | Admitting: Physical Therapy

## 2018-07-15 NOTE — Therapy (Signed)
[place] MAIN [facility] SERVICES [address] [place], [place], [address] Phone: [phone]   Fax:  [phone]  Patient Details  Name: Sheila Peters MRN: [id number] Date of Birth: [birth year] Referring Provider:  No ref. provider found  Encounter Date: 07/15/2018  DPT called patient to let patient know the status of the clinic and ensure that we will be reaching out to schedule when we re-open. DPT fielded any questions patient had at this time and offered guidance on current home program as necessary. Patient reported that she intends to do her HEP this afternoon. Patient had no further questions at this time, and DPT advised that should questions arise patient can reach out to the clinic for guidance via phone or DPT email.  Myles Gip PT, DPT [phone] 07/15/2018, 1:56 PM  [place] MAIN [facility] SERVICES [address] [place], [place], [address] Phone: [phone]   Fax:  [phone]

## 2018-07-17 ENCOUNTER — Ambulatory Visit: Payer: Medicare Other

## 2018-09-13 ENCOUNTER — Telehealth: Payer: Self-pay | Admitting: *Deleted

## 2018-09-13 NOTE — Telephone Encounter (Signed)
Left message for patient to notify them that it is time to schedule annual low dose lung cancer screening CT scan. Instructed patient to call back to verify information prior to the scan being scheduled.  

## 2018-09-17 ENCOUNTER — Telehealth: Payer: Self-pay | Admitting: *Deleted

## 2018-09-17 DIAGNOSIS — Z122 Encounter for screening for malignant neoplasm of respiratory organs: Secondary | ICD-10-CM

## 2018-09-17 DIAGNOSIS — Z87891 Personal history of nicotine dependence: Secondary | ICD-10-CM

## 2018-09-17 NOTE — Telephone Encounter (Signed)
Patient has been notified that annual lung cancer screening low dose CT scan is due currently or will be in near future. Confirmed that patient is within the age range of 55-77, and asymptomatic, (no signs or symptoms of lung cancer). Patient denies illness that would prevent curative treatment for lung cancer if found. Verified smoking history, (former, quit 03/2014, 46 pack year). The shared decision making visit was done 10/23/13. Patient is agreeable for CT scan being scheduled.

## 2018-09-25 ENCOUNTER — Ambulatory Visit
Admission: RE | Admit: 2018-09-25 | Discharge: 2018-09-25 | Disposition: A | Discharge status: Home / Self Care | Payer: Medicare Other | Source: Ambulatory Visit | Attending: Oncology | Admitting: Oncology

## 2018-09-25 ENCOUNTER — Other Ambulatory Visit: Payer: Self-pay

## 2018-09-25 DIAGNOSIS — Z122 Encounter for screening for malignant neoplasm of respiratory organs: Secondary | ICD-10-CM | POA: Diagnosis not present

## 2018-09-25 DIAGNOSIS — Z87891 Personal history of nicotine dependence: Secondary | ICD-10-CM | POA: Diagnosis present

## 2018-09-27 ENCOUNTER — Encounter: Payer: Self-pay | Admitting: *Deleted

## 2018-10-17 ENCOUNTER — Encounter: Payer: Self-pay | Admitting: *Deleted

## 2018-10-17 ENCOUNTER — Other Ambulatory Visit: Payer: Self-pay

## 2018-10-18 ENCOUNTER — Other Ambulatory Visit
Admission: RE | Admit: 2018-10-18 | Discharge: 2018-10-18 | Disposition: A | Discharge status: Home / Self Care | Payer: Medicare Other | Source: Ambulatory Visit | Attending: Ophthalmology | Admitting: Ophthalmology

## 2018-10-18 DIAGNOSIS — Z1159 Encounter for screening for other viral diseases: Secondary | ICD-10-CM | POA: Insufficient documentation

## 2018-10-18 NOTE — Discharge Instructions (Signed)

## 2018-10-19 LAB — NOVEL CORONAVIRUS, NAA (HOSP ORDER, SEND-OUT TO REF LAB; TAT 18-24 HRS): SARS-CoV-2, NAA: NOT DETECTED

## 2018-10-23 ENCOUNTER — Other Ambulatory Visit: Payer: Self-pay

## 2018-10-23 ENCOUNTER — Ambulatory Visit
Admission: RE | Admit: 2018-10-23 | Discharge: 2018-10-23 | Disposition: A | Discharge status: Home / Self Care | Payer: Medicare Other | Attending: Ophthalmology | Admitting: Ophthalmology

## 2018-10-23 ENCOUNTER — Ambulatory Visit: Payer: Medicare Other | Admitting: Anesthesiology

## 2018-10-23 ENCOUNTER — Encounter
Admission: RE | Disposition: A | Discharge status: Home / Self Care | Payer: Self-pay | Source: Home / Self Care | Attending: Ophthalmology

## 2018-10-23 DIAGNOSIS — K219 Gastro-esophageal reflux disease without esophagitis: Secondary | ICD-10-CM | POA: Diagnosis not present

## 2018-10-23 DIAGNOSIS — Z79890 Hormone replacement therapy (postmenopausal): Secondary | ICD-10-CM | POA: Insufficient documentation

## 2018-10-23 DIAGNOSIS — J449 Chronic obstructive pulmonary disease, unspecified: Secondary | ICD-10-CM | POA: Diagnosis not present

## 2018-10-23 DIAGNOSIS — Z7951 Long term (current) use of inhaled steroids: Secondary | ICD-10-CM | POA: Diagnosis not present

## 2018-10-23 DIAGNOSIS — H2511 Age-related nuclear cataract, right eye: Secondary | ICD-10-CM | POA: Insufficient documentation

## 2018-10-23 DIAGNOSIS — M1990 Unspecified osteoarthritis, unspecified site: Secondary | ICD-10-CM | POA: Diagnosis not present

## 2018-10-23 DIAGNOSIS — M810 Age-related osteoporosis without current pathological fracture: Secondary | ICD-10-CM | POA: Diagnosis not present

## 2018-10-23 DIAGNOSIS — Z79899 Other long term (current) drug therapy: Secondary | ICD-10-CM | POA: Diagnosis not present

## 2018-10-23 DIAGNOSIS — E039 Hypothyroidism, unspecified: Secondary | ICD-10-CM | POA: Insufficient documentation

## 2018-10-23 HISTORY — PX: CATARACT EXTRACTION W/PHACO: SHX586

## 2018-10-23 SURGERY — PHACOEMULSIFICATION, CATARACT, WITH IOL INSERTION
Anesthesia: Monitor Anesthesia Care | Site: Eye | Laterality: Right

## 2018-10-23 MED ORDER — TETRACAINE HCL 0.5 % OP SOLN
1.0000 [drp] | OPHTHALMIC | Status: DC | PRN
  Administered 2018-10-23 (×2): 1 [drp] via OPHTHALMIC

## 2018-10-23 MED ORDER — LACTATED RINGERS IV SOLN: INTRAVENOUS | Status: DC

## 2018-10-23 MED ORDER — NA HYALUR & NA CHOND-NA HYALUR 0.4-0.35 ML IO KIT
PACK | INTRAOCULAR | Status: DC | PRN
  Administered 2018-10-23: 1 mL via INTRAOCULAR

## 2018-10-23 MED ORDER — LIDOCAINE HCL (PF) 2 % IJ SOLN
INTRAOCULAR | Status: DC | PRN
  Administered 2018-10-23: 1 mL

## 2018-10-23 MED ORDER — EPINEPHRINE PF 1 MG/ML IJ SOLN
INTRAOCULAR | Status: DC | PRN
  Administered 2018-10-23: 81 mL via OPHTHALMIC

## 2018-10-23 MED ORDER — PHENYLEPHRINE HCL 10 % OP SOLN
1.0000 [drp] | OPHTHALMIC | Status: DC | PRN
  Administered 2018-10-23 (×3): 1 [drp] via OPHTHALMIC

## 2018-10-23 MED ORDER — MOXIFLOXACIN HCL 0.5 % OP SOLN
1.0000 [drp] | OPHTHALMIC | Status: DC | PRN
  Administered 2018-10-23 (×3): 1 [drp] via OPHTHALMIC

## 2018-10-23 MED ORDER — ONDANSETRON HCL 4 MG/2ML IJ SOLN
4.0000 mg | Freq: Once | INTRAMUSCULAR | Status: AC | PRN
  Administered 2018-10-23: 14:00:00 4 mg via INTRAVENOUS

## 2018-10-23 MED ORDER — FENTANYL CITRATE (PF) 100 MCG/2ML IJ SOLN
INTRAMUSCULAR | Status: DC | PRN
  Administered 2018-10-23 (×2): 50 ug via INTRAVENOUS

## 2018-10-23 MED ORDER — BRIMONIDINE TARTRATE-TIMOLOL 0.2-0.5 % OP SOLN
OPHTHALMIC | Status: DC | PRN
  Administered 2018-10-23: 1 [drp] via OPHTHALMIC

## 2018-10-23 MED ORDER — CYCLOPENTOLATE HCL 2 % OP SOLN
1.0000 [drp] | OPHTHALMIC | Status: DC | PRN
  Administered 2018-10-23 (×3): 1 [drp] via OPHTHALMIC

## 2018-10-23 MED ORDER — CEFUROXIME OPHTHALMIC INJECTION 1 MG/0.1 ML
INJECTION | OPHTHALMIC | Status: DC | PRN
  Administered 2018-10-23: 0.1 mL via INTRACAMERAL

## 2018-10-23 SURGICAL SUPPLY — 21 items
CANNULA ANT/CHMB 27G (MISCELLANEOUS) ×1 IMPLANT
CANNULA ANT/CHMB 27GA (MISCELLANEOUS) ×3 IMPLANT
CORD BIP STRL DISP 12FT (MISCELLANEOUS) ×2 IMPLANT
GLOVE SURG LX 7.5 STRW (GLOVE) ×2
GLOVE SURG LX STRL 7.5 STRW (GLOVE) ×1 IMPLANT
GLOVE SURG TRIUMPH 8.0 PF LTX (GLOVE) ×3 IMPLANT
GOWN STRL REUS W/ TWL LRG LVL3 (GOWN DISPOSABLE) ×2 IMPLANT
GOWN STRL REUS W/TWL LRG LVL3 (GOWN DISPOSABLE) ×6
LENS IOL TECNIS ITEC 21.0 (Intraocular Lens) ×2 IMPLANT
MARKER SKIN DUAL TIP RULER LAB (MISCELLANEOUS) ×3 IMPLANT
NDL FILTER BLUNT 18X1 1/2 (NEEDLE) ×1 IMPLANT
NEEDLE FILTER BLUNT 18X 1/2SAF (NEEDLE) ×2
NEEDLE FILTER BLUNT 18X1 1/2 (NEEDLE) ×1 IMPLANT
PACK CATARACT BRASINGTON (MISCELLANEOUS) ×3 IMPLANT
PACK EYE AFTER SURG (MISCELLANEOUS) ×3 IMPLANT
PACK OPTHALMIC (MISCELLANEOUS) ×3 IMPLANT
SYR 3ML LL SCALE MARK (SYRINGE) ×3 IMPLANT
SYR 5ML LL (SYRINGE) ×3 IMPLANT
SYR TB 1ML LUER SLIP (SYRINGE) ×3 IMPLANT
WATER STERILE IRR 500ML POUR (IV SOLUTION) ×3 IMPLANT
WIPE NON LINTING 3.25X3.25 (MISCELLANEOUS) ×3 IMPLANT

## 2018-10-23 NOTE — Anesthesia Postprocedure Evaluation (Signed)
Anesthesia Post Note  Patient: Sheila Peters  Procedure(s) Performed: CATARACT EXTRACTION PHACO AND INTRAOCULAR LENS PLACEMENT (IOC) RIGHT (Right Eye)  Patient location during evaluation: PACU Anesthesia Type: MAC Level of consciousness: awake and alert Pain management: pain level controlled Vital Signs Assessment: post-procedure vital signs reviewed and stable Respiratory status: spontaneous breathing, nonlabored ventilation, respiratory function stable and patient connected to nasal cannula oxygen Cardiovascular status: stable and blood pressure returned to baseline Postop Assessment: no apparent nausea or vomiting Anesthetic complications: no    Veda Canning

## 2018-10-23 NOTE — Op Note (Signed)
LOCATION:  [facility]   PREOPERATIVE DIAGNOSIS:    Nuclear sclerotic cataract right eye. H25.11   POSTOPERATIVE DIAGNOSIS:  Nuclear sclerotic cataract right eye.     PROCEDURE:  Phacoemusification with posterior chamber intraocular lens placement of the right eye   LENS:   Implant Name Type Inv. Item Serial No. Manufacturer Lot No. LRB No. Used Action  LENS IOL DIOP 21.0 - [id number] Intraocular Lens LENS IOL DIOP 21.0 [id number]  Right 1 Implanted        ULTRASOUND TIME: 16 % of 1 minutes, 23 seconds.  CDE 13.6   SURGEON:  Wyonia Hough, MD   ANESTHESIA:  Topical with tetracaine drops and 2% Xylocaine jelly, augmented with 1% preservative-free intracameral lidocaine.    COMPLICATIONS:  None.   DESCRIPTION OF PROCEDURE:  The patient was identified in the holding room and transported to the operating room and placed in the supine position under the operating microscope.  The right eye was identified as the operative eye and it was prepped and draped in the usual sterile ophthalmic fashion.   A 1 millimeter clear-corneal paracentesis was made at the 12:00 position.  0.5 ml of preservative-free 1% lidocaine was injected into the anterior chamber. The anterior chamber was filled with Viscoat viscoelastic.  A 2.4 millimeter keratome was used to make a near-clear corneal incision at the 9:00 position.  A curvilinear capsulorrhexis was made with a cystotome and capsulorrhexis forceps.  Balanced salt solution was used to hydrodissect and hydrodelineate the nucleus.   Phacoemulsification was then used in stop and chop fashion to remove the lens nucleus and epinucleus.  The remaining cortex was then removed using the irrigation and aspiration handpiece. Provisc was then placed into the capsular bag to distend it for lens placement.  A lens was then injected into the capsular bag.  The remaining viscoelastic was aspirated.   Wounds were hydrated with balanced salt solution.   The anterior chamber was inflated to a physiologic pressure with balanced salt solution.  No wound leaks were noted. Cefuroxime 0.1 ml of a 64m/ml solution was injected into the anterior chamber for a dose of 1 mg of intracameral antibiotic at the completion of the case.   Timolol and Brimonidine drops were applied to the eye.  The patient was taken to the recovery room in stable condition without complications of anesthesia or surgery.   Kristianna Saperstein 10/23/2018, 2:02 PM

## 2018-10-23 NOTE — Transfer of Care (Signed)
Immediate Anesthesia Transfer of Care Note  Patient: Sheila Peters  Procedure(s) Performed: CATARACT EXTRACTION PHACO AND INTRAOCULAR LENS PLACEMENT (IOC) RIGHT (Right Eye)  Patient Location: PACU  Anesthesia Type: MAC  Level of Consciousness: awake, alert  and patient cooperative  Airway and Oxygen Therapy: Patient Spontanous Breathing and Patient connected to supplemental oxygen  Post-op Assessment: Post-op Vital signs reviewed, Patient's Cardiovascular Status Stable, Respiratory Function Stable, Patent Airway and No signs of Nausea or vomiting  Post-op Vital Signs: Reviewed and stable  Complications: No apparent anesthesia complications

## 2018-10-23 NOTE — Anesthesia Preprocedure Evaluation (Signed)
Anesthesia Evaluation  Patient identified by MRN, date of birth, ID band Patient awake    Reviewed: Allergy & Precautions, NPO status , Patient's Chart, lab work & pertinent test results  Airway Mallampati: II  TM Distance: >3 FB     Dental   Pulmonary asthma , COPD, former smoker,    breath sounds clear to auscultation       Cardiovascular negative cardio ROS   Rhythm:Regular Rate:Normal     Neuro/Psych    GI/Hepatic PUD, GERD  ,Ulcerative colitis   Endo/Other  Hypothyroidism   Renal/GU Renal disease (stones)     Musculoskeletal  (+) Arthritis ,   Abdominal   Peds  Hematology   Anesthesia Other Findings   Reproductive/Obstetrics                             Anesthesia Physical Anesthesia Plan  ASA: III  Anesthesia Plan: MAC   Post-op Pain Management:    Induction: Intravenous  PONV Risk Score and Plan:   Airway Management Planned: Nasal Cannula  Additional Equipment:   Intra-op Plan:   Post-operative Plan:   Informed Consent: I have reviewed the patients History and Physical, chart, labs and discussed the procedure including the risks, benefits and alternatives for the proposed anesthesia with the patient or authorized representative who has indicated his/her understanding and acceptance.       Plan Discussed with: CRNA  Anesthesia Plan Comments:         Anesthesia Quick Evaluation

## 2018-10-23 NOTE — H&P (Signed)

## 2018-10-24 ENCOUNTER — Encounter: Payer: Self-pay | Admitting: Ophthalmology

## 2018-11-20 ENCOUNTER — Ambulatory Visit (INDEPENDENT_AMBULATORY_CARE_PROVIDER_SITE_OTHER): Payer: Medicare Other

## 2018-11-20 ENCOUNTER — Other Ambulatory Visit: Payer: Self-pay

## 2018-11-20 DIAGNOSIS — R0609 Other forms of dyspnea: Secondary | ICD-10-CM

## 2018-11-20 NOTE — Progress Notes (Signed)
SIX MIN WALK 11/20/2018 11/28/2017 11/22/2016 01/14/2016  Medications Timolol 0.5% Advair, Levothyroxine Hair, skin & nails vitamin, Advair, Levothyroxine, Probiotic Prednisone, Levotyhroxine, Immodium, Benadryl  Supplimental Oxygen during Test? (L/min) - No No No  Laps 6 2 3 5   Partial Lap (in Meters) 0 0 24 21  Baseline BP (sitting) 144/82 138/86 130/80 118/66  Baseline Heartrate 75 77 80 100  Baseline Dyspnea (Borg Scale) 3 2 2 2   Baseline Fatigue (Borg Scale) 1 0 0 2  Baseline SPO2 92 93 95 90  BP (sitting) - 158/78 170/70 146/74  Heartrate - 104 101 107  Dyspnea (Borg Scale) - 5 7 3   Fatigue (Borg Scale) - 1 3 3   SPO2 - 85 82 90  BP (sitting) - 150/80 148/80 126/62  Heartrate - 81 75 98  SPO2 - 97 95 94  Stopped or Paused before Six Minutes - Yes Yes No  Other Symptoms at end of Exercise - stopped due to SOB and chest tightness. pt walked 2 min and 45 secs O2 sats dropped -  Distance Completed 204 96 168 261  Tech Comments: test stopped at 2:41 due to sob. SPO2 84 HR 83. 2L applied and pt maintained at 94% HR 83. - pt walked at slow pace with sandals. Pt wore dlip flops which caused pt to walk slower.

## 2018-11-21 ENCOUNTER — Ambulatory Visit: Payer: Medicare Other | Admitting: Internal Medicine

## 2018-11-21 NOTE — Progress Notes (Unsigned)
[facility] Consultation      Date: 11/21/2018,   MRN# [id number] Sheila Peters 1940-12-14    Admission                  Current  Sheila Peters is a 78 y.o. old female seen in consultation for abnormal Ct chest at the request of Dr. Ginette Pitman     CHIEF COMPLAINT:   Abnormal CT chest Follow up COPD   PREVIOUS HPI  78 yo white female abnormal CT chest, patient has h/o COPD Patient has had previous low dose lung cancer screening CT last year shows 5 MM RML nodule Patient has follow up CT chest in July 2017 which showed new LLL opacity and RUL nodular opacity and the previous 5 MM nodule has decreased in size-current CT chest 01/14/16  shows much improvement of LLL and RUL opacity  CT chest 08/03/16 shows resolution of RML nodule and also stable LLL opacity c/w scarring   HPI she has no acute symptoms at this time Denies fevers, chills, night sweats, NVD. No cough no wheezing and no SOB, but has DOE that seems to be at baseline  Patient has h/o UC and was treated heavy dose of prednisone several months ago  No signs of infection at this time   At this time patient is only using Advair at this time and it is helping her symptoms She would like to try once daily dosing She really does NOT like to wear and use her oxygen but I have stated that she needs it and I have explained how important it is to use and wear her oxygen  PFT's 01/14/16 Fev1 49% Fev1 0.97 L  ratio 49% DLCO 49%  CT chest 4/18 RML nodule approx 5MM resolved since last CT Left Lower lobe residual opacity-likely scar tissue-no change in size since last CT chest PFT's 01/14/16    Current Medication:  Current Outpatient Medications:  .  acetaminophen (TYLENOL) 500 MG tablet, Take 500 mg by mouth every 6 (six) hours as needed., Disp: , Rfl:  .  cetirizine (ZYRTEC) 10 MG tablet, Take 10 mg by mouth as needed for allergies (before remicade injection)., Disp: , Rfl:  .  famotidine (PEPCID AC  MAXIMUM STRENGTH) 20 MG tablet, Take 20 mg by mouth daily., Disp: , Rfl:  .  Fluticasone-Salmeterol (ADVAIR) 250-50 MCG/DOSE AEPB, Inhale 1 puff into the lungs 2 (two) times daily., Disp: , Rfl:  .  inFLIXimab (REMICADE) 100 MG injection, Inject 100 mg into the vein as directed., Disp: , Rfl:  .  Multiple Minerals-Vitamins (CITRACAL PLUS PO), Take by mouth daily., Disp: , Rfl:  .  Probiotic Product (PROBIOTIC ADVANCED PO), Take 1 capsule by mouth daily., Disp: , Rfl:  .  psyllium (METAMUCIL) 58.6 % powder, Take 1 packet by mouth 3 (three) times daily., Disp: , Rfl:  .  timolol (TIMOPTIC) 0.5 % ophthalmic solution, Place 1 drop into both eyes 2 (two) times daily., Disp: , Rfl:  .  VENTOLIN HFA 108 (90 Base) MCG/ACT inhaler, , Disp: , Rfl: 4    ALLERGIES   Adhesive [tape], Augmentin [amoxicillin-pot clavulanate], Codeine, Ibuprofen, Iodine, Levaquin [levofloxacin in d5w], and Morphine and related     REVIEW OF SYSTEMS   Review of Systems  Constitutional: Negative for chills, diaphoresis, fever, malaise/fatigue and weight loss.  HENT: Negative for congestion.   Respiratory: Positive for shortness of breath. Negative for cough, hemoptysis, sputum production and wheezing.   Cardiovascular: Negative for  chest pain, palpitations, orthopnea and leg swelling.  Gastrointestinal: Negative for abdominal pain, heartburn, nausea and vomiting.  Musculoskeletal: Negative for myalgias.  Neurological: Negative for weakness.  All other systems reviewed and are negative.   There were no vitals taken for this visit.    PHYSICAL EXAM  Physical Exam  Constitutional: She is oriented to person, place, and time. She appears well-developed and well-nourished. No distress.  HENT:  Mouth/Throat: No oropharyngeal exudate.  Cardiovascular: Normal rate, regular rhythm and normal heart sounds.  No murmur heard. Pulmonary/Chest: Effort normal and breath sounds normal. No stridor. No respiratory distress. She  has no wheezes. She has no rales.  Musculoskeletal: Normal range of motion.        General: No edema.  Neurological: She is alert and oriented to person, place, and time. No cranial nerve deficit.  Skin: Skin is warm. She is not diaphoretic.  Psychiatric: She has a normal mood and affect.       IMAGING   Compared to CT chest 01/14/16 RML nodule approx 5MM resolved  Left Lower lobe residual opacity-likely scar tissue/rounded atelactasis   CT chest June 2017 RML decreased in size from 5 mm. RUL nodular opacity LLL opacity with air bronchogram signs  CT chest July 2016 RML nodule approx 5 mm  ASSESSMENT/PLAN   78 yo white female former smoker with h/o moderate/severe COPD gold stage A with abnormal CT chest with stable/decreased RML 5 MM nodule with resolving  LLL opacity now with left lower lobe scar/rounded atelectasis    #1 shortness of breath and dyspnea exertion related to COPD and deconditioned state Will need to re-assess for hypoxia will check for 6 minute walk test   #2 COPD moderate to severe Gold stage A Will continue to prescribed Advair-she would like to change to once daily dosing Albuterol as needed   #3 deconditioned state Recommend exercise as tolerated  #4 abnormal CT scan Left lower lobe residual scar tissue in rounded atelectasis consider repeat CT chest in the next 6 months   Follow up in 6 months Patient  satisfied with Plan of action and management. All questions answered  Corrin Parker, M.D.  [facility]  Medical Director [place] Director [facility] Cardio-Pulmonary Department

## 2019-01-21 ENCOUNTER — Other Ambulatory Visit: Payer: Self-pay | Admitting: Internal Medicine

## 2019-01-21 DIAGNOSIS — H5319 Other subjective visual disturbances: Secondary | ICD-10-CM

## 2019-01-21 DIAGNOSIS — R519 Headache, unspecified: Secondary | ICD-10-CM

## 2019-01-21 DIAGNOSIS — R2689 Other abnormalities of gait and mobility: Secondary | ICD-10-CM

## 2019-02-02 ENCOUNTER — Other Ambulatory Visit: Payer: Self-pay

## 2019-02-02 ENCOUNTER — Ambulatory Visit
Admission: RE | Admit: 2019-02-02 | Discharge: 2019-02-02 | Disposition: A | Discharge status: Home / Self Care | Payer: Medicare Other | Source: Ambulatory Visit | Attending: Internal Medicine | Admitting: Internal Medicine

## 2019-02-02 DIAGNOSIS — H5319 Other subjective visual disturbances: Secondary | ICD-10-CM | POA: Diagnosis present

## 2019-02-02 DIAGNOSIS — R519 Headache, unspecified: Secondary | ICD-10-CM | POA: Diagnosis not present

## 2019-02-02 DIAGNOSIS — R2689 Other abnormalities of gait and mobility: Secondary | ICD-10-CM

## 2019-07-07 ENCOUNTER — Ambulatory Visit: Payer: Medicare Other | Attending: Neurology

## 2019-07-07 ENCOUNTER — Other Ambulatory Visit: Payer: Self-pay

## 2019-07-07 DIAGNOSIS — R2681 Unsteadiness on feet: Secondary | ICD-10-CM | POA: Diagnosis present

## 2019-07-07 DIAGNOSIS — M6281 Muscle weakness (generalized): Secondary | ICD-10-CM | POA: Diagnosis not present

## 2019-07-07 DIAGNOSIS — R278 Other lack of coordination: Secondary | ICD-10-CM | POA: Insufficient documentation

## 2019-07-07 NOTE — Therapy (Signed)
[place] MAIN [facility] SERVICES [place], [place], [address] Phone: [phone]   Fax:  [phone]  Physical Therapy Evaluation  Patient Details  Name: Sheila Peters MRN: [id number] Date of Birth: February 05, 1941 Referring Provider (PT): Dr. Benjaman Kindler   Encounter Date: 07/07/2019  PT End of Session - 07/07/19 1226    Visit Number  1    Number of Visits  25    Date for PT Re-Evaluation  09/29/19    Authorization Type  eval 07/07/19    PT Start Time  1100    PT Stop Time  1150    PT Time Calculation (min)  50 min    Equipment Utilized During Treatment  Gait belt    Activity Tolerance  Patient tolerated treatment well    Behavior During Therapy  [facility] for tasks assessed/performed       Past Medical History:  Diagnosis Date  . Arthritis   . Asthma   . Clostridium difficile diarrhea   . Colitis, nonspecific   . COPD (chronic obstructive pulmonary disease) ([place])   . GERD (gastroesophageal reflux disease)   . Hypothyroidism    post-surgical  . Multiple thyroid nodules    post excision  . Nephrolithiasis    spontaneously passed  . Nephrolithiasis   . Non-toxic uninodular goiter   . Osteoporosis, postmenopausal   . Ulcerative colitis, chronic ([place])   . Ulcerative pancolitis without complication [facility])     Past Surgical History:  Procedure Laterality Date  . ABDOMINAL HYSTERECTOMY    . ANKLE SURGERY     left fracture  . BACK SURGERY     herniated disk  . CATARACT EXTRACTION W/PHACO Right 10/23/2018   Procedure: CATARACT EXTRACTION PHACO AND INTRAOCULAR LENS PLACEMENT ([place]) RIGHT;  Surgeon: Leandrew Koyanagi, MD;  Location: [place];  Service: Ophthalmology;  Laterality: Right;  PT WANTS TO BE LAST CASE  . COLONOSCOPY N/A 02/26/2017   Procedure: COLONOSCOPY;  Surgeon: Manya Silvas, MD;  Location: [facility] ENDOSCOPY;  Service: Endoscopy;  Laterality: N/A;  . COLONOSCOPY WITH PROPOFOL N/A 11/02/2014   Procedure:  COLONOSCOPY WITH PROPOFOL;  Surgeon: Josefine Class, MD;  Location: [facility] ENDOSCOPY;  Service: Endoscopy;  Laterality: N/A;  . excision of thyroid nodule    . 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 JOINT REPLACEMENT     2014 lth    There were no vitals filed for this visit.   Subjective Assessment - 07/07/19 1056    Subjective  Imbalance    Pertinent History  Pt reports that she is having intermittent episodes of imbalance that have been occurring over the last year. She reports feeling occasionally lightheaded. She denies any presyncopal symptoms. She reports feeling unsteady approximately once/week. She also complains of intermittent SOB from her COPD. She had a cataract removed in July 2020 and feels like her vision is worse after the procedure. Since the procedure she has been back for an eye exam without notable findings. Pt wears reading glasses but otherwise no corrective lenses. Denies any pain upon arrival to evaluation. 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 She was placed on Vit B12, B3, and magnesium. Her aura has not occurred in a couple months. She reports a history of low back surgery but doesn't recall what was done. She does know  that she has a herniated disc. History of L ankle fracture 2003 with ORIF. She gets intermittent foot numbness which only lasts a short period of time.    Limitations  Walking    Diagnostic tests  02/02/19 MRI: Atrophy and small vessel disease. No acute intracranial findings. 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    Patient Stated Goals  Improve balance    Currently in Pain?  No/denies          SUBJECTIVE Chief complaint: Imbalance Onset: Pt reports  that she is having intermittent episodes of imbalance that have been occurring over the last year. She reports feeling occasionally lightheaded. She denies any presyncopal symptoms. She reports feeling unsteady approximately once/week. She also complains of intermittent SOB from her COPD. She had a cataract removed in July 2020 and feels like her vision is worse after the procedure. Since the procedure she has been back for an eye exam without notable findings. Pt wears reading glasses but otherwise no corrective lenses. Denies any pain upon arrival to evaluation. 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 She was placed on Vit B12, B3, and magnesium. Her aura has not occurred in a couple months. She reports a history of low back surgery but doesn't recall what was done. She does know that she has a herniated disc. History of L ankle fracture 2003 with ORIF. She gets intermittent foot numbness which only lasts a short period of time.  Imaging: 02/02/19 MRI: Atrophy and small vessel disease. No acute intracranial findings. 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 Recent changes in overall health/medication: No Directional pattern for falls: None Prior history of physical therapy for balance: She reports some balance therapy after her L THR in 2014.  Red flags (bowel/bladder changes, saddle paresthesia, personal history of cancer, chills/fever, night sweats, unrelenting pain) Negative   OBJECTIVE  MUSCULOSKELETAL: Tremor: Absent Bulk: Pt appears to have some mild muscle wasting Tone: Normal, no clonus  Posture Mild forward head and rounded shoulder  Gait Decrease step length and overall speed bilaterally. Pt has to use hand rails to performs steps and performs step-to pattern during descent.   Strength R/L 4/4 Hip flexion Hip  abduction/adduction significantly weak bilaterally as tested in sitting, LLE is more weak than RLE 5/5 Hip adduction 4+/4+ Knee extension 4+/4+ Knee flexion 4/4 Ankle Dorsiflexion UE strength grossly WFL, decreased grip strength noted bilaterally but particularly in RUE;   NEUROLOGICAL:  Mental Status Patient is oriented to person, place and time.  Recent memory is intact.  Remote memory is intact.  Attention span and concentration are intact.  Expressive speech is intact.  Patient's fund of knowledge is within normal limits for educational level.  Cranial Nerves Visual acuity and visual fields are intact  Extraocular muscles are intact however vertical gaze appears mildly diminished bilaterally, smooth pursuit is saccadic Facial sensation is intact bilaterally  Facial strength is intact bilaterally  Hearing is normal as tested by gross conversation Palate elevates midline, normal phonation  Shoulder shrug strength is intact  Tongue protrudes midline   Sensation Grossly intact to light touch bilateral UEs/LEs as determined by testing dermatomes C2-T2/L2-S2 respectively Proprioception and hot/cold testing deferred on this date  Reflexes Deferred  Coordination/Cerebellar Finger to Nose: WNL Heel to Shin: Limited due to LE strength/hip range of motion;  Rapid alternating movements: WNL Finger Opposition: WNL Pronator Drift: Negative  FUNCTIONAL OUTCOME MEASURES   Results Comments  BERG 52/56 Fall risk, in need of intervention  TUG 11.4 seconds WNL  5TSTS 16.4 seconds Fall risk, in need of intervention  10 Meter Gait Speed Self-selected: 12.0s = 0.83 m/s; Fastest: 11.1s = 0.16ms Below normative values for full community ambulation  ABC Scale 69.4% Above cut-off    POSTURAL CONTROL TESTS   Modified Clinical Test of Sensory Interaction for Balance    (CTSIB):  CONDITION TIME STRATEGY SWAY  Eyes open, firm surface 30 seconds ankle 1+  Eyes closed, firm surface 30  seconds ankle 2+  Eyes open, foam surface 30 seconds ankle 2+  Eyes closed, foam surface 5 seconds ankle 4+    OCULOMOTOR / VESTIBULAR TESTING:Deferred     [facility] PT Assessment - 07/07/19 1059      Assessment   Medical Diagnosis  Imbalance    Referring Provider (PT)  Dr. SBenjaman Kindler   Onset Date/Surgical Date  07/07/18   Approximate   Hand Dominance  Right    Next MD Visit  Not reported    Prior Therapy  None for balance      Precautions   Precautions  Fall      Restrictions   Weight Bearing Restrictions  No      Balance Screen   Has the patient fallen in the past 6 months  No      [place]    Living Arrangements  Alone    Available Help at Discharge  Family;Available PRN/intermittently    Type of Home  House    Home Access  Stairs to enter    Entrance Stairs-Number of Steps  4    Entrance Stairs-Rails  Right;Left    Home Layout  One level      Prior Function   Level of Independence  Independent with basic ADLs    Vocation  Retired    Leisure  read, watch TV, visit with family and friends      Cognition   Overall Cognitive Status  Within Functional Limits for tasks assessed    Attention  --      Standardized Balance Assessment   Standardized Balance Assessment  Berg Balance Test      Berg Balance Test   Sit to Stand  Able to stand without using hands and stabilize independently    Standing Unsupported  Able to stand safely 2 minutes    Sitting with Back Unsupported but Feet Supported on Floor or Stool  Able to sit safely and securely 2 minutes    Stand to Sit  Sits safely with minimal use of hands    Transfers  Able to transfer safely, minor use of hands    Standing Unsupported with Eyes Closed  Able to stand 10 seconds safely    Standing Unsupported with Feet Together  Able to place feet together independently and stand 1 minute safely    From Standing, Reach Forward with Outstretched Arm  Can reach confidently >25 cm  (10")    From Standing Position, Pick up Object from Floor  Able to pick up shoe, needs supervision    From Standing Position, Turn to Look Behind Over each Shoulder  Looks behind one side only/other side shows less weight shift    Turn 360 Degrees  Able to turn 360 degrees safely in 4 seconds or less    Standing  Unsupported, Alternately Place Feet on Step/Stool  Able to stand independently and safely and complete 8 steps in 20 seconds    Standing Unsupported, One Foot in Front  Able to plae foot ahead of the other independently and hold 30 seconds    Standing on One Leg  Able to lift leg independently and hold 5-10 seconds   More limited on LLE   Total Score  52                Objective measurements completed on examination: See above findings.              PT Education - 07/07/19 1302    Education Details  Plan of care    Person(s) Educated  Patient    Methods  Explanation    Comprehension  Verbalized understanding       PT Short Term Goals - 07/07/19 1136      PT SHORT TERM GOAL #1   Title  Pt will be independent with HEP in order to improve strength and balance in order to decrease fall risk and improve function at home.    Time  6    Period  Weeks    Status  New    Target Date  08/18/19        PT Long Term Goals - 07/07/19 1233      PT LONG TERM GOAL #1   Title  Pt will improve BERG by at least 3 points in order to demonstrate clinically significant improvement in balance.    Baseline  07/07/19: 52/56    Time  12    Period  Weeks    Status  New    Target Date  09/29/19      PT LONG TERM GOAL #2   Title  Pt will improve ABC by at least 13% in order to demonstrate clinically significant improvement in balance confidence.    Baseline  07/07/19: 69.4%    Time  12    Period  Weeks    Status  New    Target Date  09/29/19      PT LONG TERM GOAL #3   Title  Pt will decrease 5TSTS by at least 3 seconds in order to demonstrate clinically significant  improvement in LE strength.    Baseline  07/07/19: 16.4s    Time  12    Period  Weeks    Status  New    Target Date  09/29/19      PT LONG TERM GOAL #4   Title  Pt will increase self-selected 10MWT by at least 0.13 m/s in order to demonstrate clinically significant improvement in community ambulation.    Baseline  07/07/19: Self-selected: 12.0s = 0.83 m/s    Time  12    Period  Weeks    Status  New    Target Date  09/29/19             Plan - 07/07/19 1227    Clinical Impression Statement  Pt is a pleasant 79 year-old female referred for difficulty with balance. PT examination reveals deficits in LE strength, endurance, and balance. Her 14mgait speed is below normative values for full community ambulation and her 5TSTS places her at a higher risk for future falls. BERG of 52/56 reveals higher level balance deficits. She also struggles with balance on uneven surfaces with eyes closed. Pt presents with deficits in strength, gait and balance. She will benefit from skilled PT services  to address these deficits decrease risk for future falls.    Personal Factors and Comorbidities  Age;Comorbidity 3+;Time since onset of injury/illness/exacerbation    Comorbidities  COPD, ulcerative colitis, osteoporosis, lumbar surgery, L hip replacement    Examination-Activity Limitations  Bend;Stairs;Locomotion Level    Examination-Participation Restrictions  Community Activity;Cleaning    Stability/Clinical Decision Making  Evolving/Moderate complexity    Clinical Decision Making  Moderate    Rehab Potential  Fair    PT Frequency  2x / week    PT Duration  12 weeks    PT Treatment/Interventions  ADLs/Self Care Home Management;Biofeedback;Electrical Stimulation;Moist Heat;Gait training;Stair training;Functional mobility training;Therapeutic activities;Therapeutic exercise;Balance training;Neuromuscular re-education;Manual techniques;Dry needling;Taping;Spinal Manipulations;Joint Manipulations;Aquatic  Therapy;Canalith Repostioning;Cryotherapy;Iontophoresis 85m/ml Dexamethasone;Traction;Ultrasound;DME Instruction;Patient/family education;Passive range of motion;Vestibular    PT Next Visit Plan  Initiate HEP, balance and strengthening    PT Home Exercise Plan  Pt encouraged to start sit to stands at home, 2 x 10 BID;    Consulted and Agree with Plan of Care  Patient       Patient will benefit from skilled therapeutic intervention in order to improve the following deficits and impairments:  Decreased mobility, Decreased endurance, Decreased strength, Decreased balance, Abnormal gait, Decreased activity tolerance, Impaired perceived functional ability  Visit Diagnosis: Muscle weakness (generalized)  Unsteadiness on feet     Problem List Patient Active Problem List   Diagnosis Date Noted  . Swelling of limb 10/10/2016  . Pain in limb 10/10/2016  . Varicose veins of leg with pain, left 10/10/2016  . Abnormal CT scan of lung 10/29/2015  . Personal history of tobacco use, presenting hazards to health 11/04/2014  . Preop cardiovascular exam 09/11/2011  . SOB (shortness of breath) 08/21/2011  . Chest pain 08/21/2011  . PVC's (premature ventricular contractions) 08/21/2011   JPhillips GroutPT, DPT, GCS  Monserratt Knezevic 07/07/2019, 2:38 PM  [facility] [address] [address]Phone: [phone]  Fax:  [phone] Name: Sheila STEGMAIERMRN: [id number] of Birth: 210-13-1942

## 2019-07-09 ENCOUNTER — Ambulatory Visit: Payer: Medicare Other

## 2019-07-14 ENCOUNTER — Encounter: Payer: Self-pay | Admitting: Physical Therapy

## 2019-07-14 ENCOUNTER — Other Ambulatory Visit: Payer: Self-pay

## 2019-07-14 ENCOUNTER — Ambulatory Visit: Payer: Medicare Other | Admitting: Physical Therapy

## 2019-07-14 DIAGNOSIS — M6281 Muscle weakness (generalized): Secondary | ICD-10-CM | POA: Diagnosis not present

## 2019-07-14 DIAGNOSIS — R2681 Unsteadiness on feet: Secondary | ICD-10-CM

## 2019-07-14 DIAGNOSIS — R278 Other lack of coordination: Secondary | ICD-10-CM

## 2019-07-14 NOTE — Patient Instructions (Signed)
Access Code: [email or web address] URL: [email or web address] Date: 07/14/2019 Prepared by: Blanche East  Exercises Seated March with Resistance - 1 x daily - 7 x weekly - 2 sets - 10 reps Seated Hip Abduction with Resistance - 1 x daily - 7 x weekly - 2 sets - 15 reps Seated Long Arc Quad - 1 x daily - 7 x weekly - 2 sets - 10 reps Seated Ankle Dorsiflexion with Resistance - 1 x daily - 7 x weekly - 2 sets - 15 reps Standing Hip Abduction with Resistance at Ankles and Counter Support - 1 x daily - 7 x weekly - 2 sets - 10 reps Standing Hip Extension with Resistance at Ankles and Counter Support - 1 x daily - 7 x weekly - 2 sets - 10 reps Heel rises with counter support - 1 x daily - 7 x weekly - 2 sets - 15 reps Standing March with Counter Support - 1 x daily - 7 x weekly - 2 sets - 15 reps

## 2019-07-14 NOTE — Therapy (Signed)
[place] MAIN [facility] SERVICES [address] [place], [place], [address] Phone: [phone]   Fax:  [phone]  Physical Therapy Treatment  Patient Details  Name: Sheila Peters MRN: [id number] Date of Birth: 29-Oct-1940 Referring Provider (PT): Dr. Manuella Ghazi   Encounter Date: 07/14/2019  PT End of Session - 07/14/19 1152    Visit Number  2    Number of Visits  25    Date for PT Re-Evaluation  09/29/19    Authorization Type  eval 07/07/19    PT Start Time  1147    PT Stop Time  1230    PT Time Calculation (min)  43 min    Equipment Utilized During Treatment  Gait belt    Activity Tolerance  Patient tolerated treatment well    Behavior During Therapy  [facility] for tasks assessed/performed       Past Medical History:  Diagnosis Date  . Arthritis   . Asthma   . Clostridium difficile diarrhea   . Colitis, nonspecific   . COPD (chronic obstructive pulmonary disease) ([place])   . GERD (gastroesophageal reflux disease)   . Hypothyroidism    post-surgical  . Multiple thyroid nodules    post excision  . Nephrolithiasis    spontaneously passed  . Nephrolithiasis   . Non-toxic uninodular goiter   . Osteoporosis, postmenopausal   . Ulcerative colitis, chronic ([place])   . Ulcerative pancolitis without complication [facility])     Past Surgical History:  Procedure Laterality Date  . ABDOMINAL HYSTERECTOMY    . ANKLE SURGERY     left fracture  . BACK SURGERY     herniated disk  . CATARACT EXTRACTION W/PHACO Right 10/23/2018   Procedure: CATARACT EXTRACTION PHACO AND INTRAOCULAR LENS PLACEMENT ([place]) RIGHT;  Surgeon: Leandrew Koyanagi, MD;  Location: [place];  Service: Ophthalmology;  Laterality: Right;  PT WANTS TO BE LAST CASE  . COLONOSCOPY N/A 02/26/2017   Procedure: COLONOSCOPY;  Surgeon: Manya Silvas, MD;  Location: [facility] ENDOSCOPY;  Service: Endoscopy;  Laterality: N/A;  . COLONOSCOPY WITH PROPOFOL N/A 11/02/2014   Procedure:  COLONOSCOPY WITH PROPOFOL;  Surgeon: Josefine Class, MD;  Location: [facility] ENDOSCOPY;  Service: Endoscopy;  Laterality: N/A;  . excision of thyroid nodule    . 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 JOINT REPLACEMENT     2014 lth    There were no vitals filed for this visit.  Subjective Assessment - 07/14/19 1150    Subjective  Patient reports increased soreness in low back and right knee today; She reports, "One thing I forgot to mention last week is that I fell about a year ago and wasn't able to get up.It worried me that if it happened again that I wouldn't be able to get up."    Pertinent History  Pt reports that she is having intermittent episodes of imbalance that have been occurring over the last year. She reports feeling occasionally lightheaded. She denies any presyncopal symptoms. She reports feeling unsteady approximately once/week. She also complains of intermittent SOB from her COPD. She had a cataract removed in July 2020 and feels like her vision is worse after the procedure. Since the procedure she has been back for an eye exam without notable findings. Pt wears reading glasses but otherwise no corrective lenses. Denies any pain upon arrival to evaluation. 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 She was placed on Vit B12, B3, and magnesium. Her aura has not occurred in a couple months. She reports a history of low back surgery but doesn't recall what was done. She does know that she has a herniated disc. History of L ankle fracture 2003 with ORIF. She gets intermittent foot numbness which only lasts a short period of time.    Limitations  Walking    Diagnostic tests  02/02/19 MRI: Atrophy and small vessel disease. No acute intracranial findings. Prominent perivascular spaces throughout the basal  ganglia and dolichoectasia of the intracranial vasculature, likely sequelae of chronic hypertension.    Patient Stated Goals  Improve balance    Currently in Pain?  Yes    Pain Score  5     Pain Location  Back    Pain Orientation  Lower    Pain Descriptors / Indicators  Aching;Sore    Pain Type  Chronic pain    Pain Onset  More than a month ago    Pain Frequency  Intermittent    Aggravating Factors   cold weather    Pain Relieving Factors  lying down/rest    Effect of Pain on Daily Activities  decreased activity tolerance;    Multiple Pain Sites  No        TREATMENT: Warm up on Crosstrainer, level 0 x4 min (unbilled);  Instructed patient in seated LE strengthening exercise: Red tband around BLE: -hip flexion march x10 reps bilaterally; -hip abduction/ER x15 reps; -LAQ/hamstring curl x10 reps bilaterally; -ankle DF x10 reps bilaterally;  Standing with red tband around BLE ankles: Hip abduction x10 reps bilaterally; -hip extension x10 reps bilaterally; Patient required min-moderate verbal/tactile cues for correct exercise technique including to avoid trunk lean and improve postural control for better hip strengthening;  Standing heel/toe raises x15 reps with B rail assist; Alternate march x15 reps with cues to increase AROM for better hip flexion strength and to challenge balance control;    NMR: Instructed patient in balance exercise: Standing on foam: -feet apart:  Side/side weight shift x10 reps;  Forward/backward weight shift x10 reps, required min A and mod VCs to keep erect posture with patient often collapsing at hips with increased hip extension and decreased posterior weight shift -feet together:  Arms across chest trunk rotation x10 reps each direction with CGA for safety; Patient exhibits fair static standing balance with all direction except increased difficulty with posterior weight shift;   Response to treatment: Tolerate well. Patient does require min VCs  for proper exercise technique/positioning for optimal muscle activation. She does fatigue requiring short seated rest breaks; Provided written HEP for better adherence;                      PT Education - 07/14/19 1152    Education Details  HEP/strength and balance;    Person(s) Educated  Patient    Methods  Explanation;Verbal cues    Comprehension  Verbalized understanding;Returned demonstration;Verbal cues required;Need further instruction       PT Short Term Goals - 07/07/19 1136      PT SHORT TERM GOAL #1   Title  Pt will be independent with HEP in order to improve strength and balance in order to decrease fall risk and improve function at home.    Time  6    Period  Weeks    Status  New    Target  Date  08/18/19        PT Long Term Goals - 07/07/19 1233      PT LONG TERM GOAL #1   Title  Pt will improve BERG by at least 3 points in order to demonstrate clinically significant improvement in balance.    Baseline  07/07/19: 52/56    Time  12    Period  Weeks    Status  New    Target Date  09/29/19      PT LONG TERM GOAL #2   Title  Pt will improve ABC by at least 13% in order to demonstrate clinically significant improvement in balance confidence.    Baseline  07/07/19: 69.4%    Time  12    Period  Weeks    Status  New    Target Date  09/29/19      PT LONG TERM GOAL #3   Title  Pt will decrease 5TSTS by at least 3 seconds in order to demonstrate clinically significant improvement in LE strength.    Baseline  07/07/19: 16.4s    Time  12    Period  Weeks    Status  New    Target Date  09/29/19      PT LONG TERM GOAL #4   Title  Pt will increase self-selected 10MWT by at least 0.13 m/s in order to demonstrate clinically significant improvement in community ambulation.    Baseline  07/07/19: Self-selected: 12.0s = 0.83 m/s    Time  12    Period  Weeks    Status  New    Target Date  09/29/19            Plan - 07/14/19 1231    Clinical  Impression Statement  Patient motivated and participated well within session. Initiated HEP with sitting and standing LE strengthening exercise. She does require min VCs for proper positioning and exercise technique for optimal strengthening. Patient instructed in advanced balance exercise, utilizing foam pad to challenge stance control. She does exhibit decreased posterior weight shift with increased fear of faling requiring min A for safety; She would benefit from additional skilled PT intervention to improve strength, balance and mobility;    Personal Factors and Comorbidities  Age;Comorbidity 3+;Time since onset of injury/illness/exacerbation    Comorbidities  COPD, ulcerative colitis, osteoporosis, lumbar surgery, L hip replacement    Examination-Activity Limitations  Bend;Stairs;Locomotion Level    Examination-Participation Restrictions  Community Activity;Cleaning    Stability/Clinical Decision Making  Evolving/Moderate complexity    Rehab Potential  Fair    PT Frequency  2x / week    PT Duration  12 weeks    PT Treatment/Interventions  ADLs/Self Care Home Management;Biofeedback;Electrical Stimulation;Moist Heat;Gait training;Stair training;Functional mobility training;Therapeutic activities;Therapeutic exercise;Balance training;Neuromuscular re-education;Manual techniques;Dry needling;Taping;Spinal Manipulations;Joint Manipulations;Aquatic Therapy;Canalith Repostioning;Cryotherapy;Iontophoresis 58m/ml Dexamethasone;Traction;Ultrasound;DME Instruction;Patient/family education;Passive range of motion;Vestibular    PT Next Visit Plan  Initiate HEP, balance and strengthening    PT Home Exercise Plan  Pt encouraged to start sit to stands at home, 2 x 10 BID;    Consulted and Agree with Plan of Care  Patient       Patient will benefit from skilled therapeutic intervention in order to improve the following deficits and impairments:  Decreased mobility, Decreased endurance, Decreased strength,  Decreased balance, Abnormal gait, Decreased activity tolerance, Impaired perceived functional ability  Visit Diagnosis: Muscle weakness (generalized)  Unsteadiness on feet  Other lack of coordination     Problem List Patient Active Problem List  Diagnosis Date Noted  . Swelling of limb 10/10/2016  . Pain in limb 10/10/2016  . Varicose veins of leg with pain, left 10/10/2016  . Abnormal CT scan of lung 10/29/2015  . Personal history of tobacco use, presenting hazards to health 11/04/2014  . Preop cardiovascular exam 09/11/2011  . SOB (shortness of breath) 08/21/2011  . Chest pain 08/21/2011  . PVC's (premature ventricular contractions) 08/21/2011    Olive Motyka PT, DPT 07/14/2019, 12:33 PM  [place] MAIN [facility] SERVICES [address] [place], [place], [address] Phone: [phone]   Fax:  [phone]  Name: BRIT CARBONELL MRN: [id number] Date of Birth: Mar 25, 1941

## 2019-07-16 ENCOUNTER — Ambulatory Visit: Payer: Medicare Other | Admitting: Physical Therapy

## 2019-07-17 ENCOUNTER — Ambulatory Visit: Payer: Medicare Other | Admitting: Physical Therapy

## 2019-07-21 ENCOUNTER — Other Ambulatory Visit: Payer: Self-pay

## 2019-07-21 ENCOUNTER — Ambulatory Visit: Payer: Medicare Other | Admitting: Physical Therapy

## 2019-07-21 ENCOUNTER — Encounter: Payer: Self-pay | Admitting: Physical Therapy

## 2019-07-21 DIAGNOSIS — R278 Other lack of coordination: Secondary | ICD-10-CM

## 2019-07-21 DIAGNOSIS — R2681 Unsteadiness on feet: Secondary | ICD-10-CM

## 2019-07-21 DIAGNOSIS — M6281 Muscle weakness (generalized): Secondary | ICD-10-CM | POA: Diagnosis not present

## 2019-07-21 NOTE — Therapy (Signed)
[place] MAIN [facility] SERVICES [address] [place], [place], [address] Phone: [phone]   Fax:  [phone]  Physical Therapy Treatment  Patient Details  Name: Sheila Peters MRN: [id number] Date of Birth: 24-Aug-1940 Referring Provider (PT): Dr. Manuella Ghazi   Encounter Date: 07/21/2019  PT End of Session - 07/21/19 1200    Visit Number  3    Number of Visits  25    Date for PT Re-Evaluation  09/29/19    Authorization Type  eval 07/07/19    PT Start Time  1148    PT Stop Time  1230    PT Time Calculation (min)  42 min    Equipment Utilized During Treatment  Gait belt    Activity Tolerance  Patient tolerated treatment well    Behavior During Therapy  [facility] for tasks assessed/performed       Past Medical History:  Diagnosis Date  . Arthritis   . Asthma   . Clostridium difficile diarrhea   . Colitis, nonspecific   . COPD (chronic obstructive pulmonary disease) ([place])   . GERD (gastroesophageal reflux disease)   . Hypothyroidism    post-surgical  . Multiple thyroid nodules    post excision  . Nephrolithiasis    spontaneously passed  . Nephrolithiasis   . Non-toxic uninodular goiter   . Osteoporosis, postmenopausal   . Ulcerative colitis, chronic ([place])   . Ulcerative pancolitis without complication [facility])     Past Surgical History:  Procedure Laterality Date  . ABDOMINAL HYSTERECTOMY    . ANKLE SURGERY     left fracture  . BACK SURGERY     herniated disk  . CATARACT EXTRACTION W/PHACO Right 10/23/2018   Procedure: CATARACT EXTRACTION PHACO AND INTRAOCULAR LENS PLACEMENT ([place]) RIGHT;  Surgeon: Leandrew Koyanagi, MD;  Location: [place];  Service: Ophthalmology;  Laterality: Right;  PT WANTS TO BE LAST CASE  . COLONOSCOPY N/A 02/26/2017   Procedure: COLONOSCOPY;  Surgeon: Manya Silvas, MD;  Location: [facility] ENDOSCOPY;  Service: Endoscopy;  Laterality: N/A;  . COLONOSCOPY WITH PROPOFOL N/A 11/02/2014   Procedure:  COLONOSCOPY WITH PROPOFOL;  Surgeon: Josefine Class, MD;  Location: [facility] ENDOSCOPY;  Service: Endoscopy;  Laterality: N/A;  . excision of thyroid nodule    . 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 JOINT REPLACEMENT     2014 lth    There were no vitals filed for this visit.  Subjective Assessment - 07/21/19 1159    Subjective  Patient reports doing about the same. She reports doing exercises at home 1-2 times over the weekend.    Pertinent History  Pt reports that she is having intermittent episodes of imbalance that have been occurring over the last year. She reports feeling occasionally lightheaded. She denies any presyncopal symptoms. She reports feeling unsteady approximately once/week. She also complains of intermittent SOB from her COPD. She had a cataract removed in July 2020 and feels like her vision is worse after the procedure. Since the procedure she has been back for an eye exam without notable findings. Pt wears reading glasses but otherwise no corrective lenses. Denies any pain upon arrival to evaluation. 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 She was placed on Vit B12, B3, and magnesium. Her aura has not occurred in a couple months. She reports  a history of low back surgery but doesn't recall what was done. She does know that she has a herniated disc. History of L ankle fracture 2003 with ORIF. She gets intermittent foot numbness which only lasts a short period of time.    Limitations  Walking    Diagnostic tests  02/02/19 MRI: Atrophy and small vessel disease. No acute intracranial findings. 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    Patient Stated Goals  Improve balance    Currently in  Pain?  No/denies    Pain Onset  More than a month ago    Multiple Pain Sites  No          TREATMENT: Warm up on Crosstrainer, level 0 x4 min (unbilled);  Exercise:  Attempted leg press. However upon getting on machine, patient reports increased low back pain and unable to push machine; Discontinued; Instructed patient in gait around gym x2 laps to help reduce back discomfort;   NMR: Instructed patient in balance exercise: Standing on foam: -feet apart:  Eyes open 30 sec hold, eyes closed 30 sec hold x1 set each             Side/side weight shift x10 reps;             Forward/backward weight shift x10 reps, required min A and mod VCs to keep erect posture with patient often collapsing at hips with increased hip extension and decreased posterior weight shift -Alternate toe taps from airex to 6 inch step with 0 rail assist x15 reps bilaterally; -Standing one foot on airex, one foot on 6 inch step:  BUE ball pass side/side x5 reps each; -tandem stance:  Head turns side/side x10 reps each; Patient required min VCs for balance stability, including to increase trunk control for less loss of balance with smaller base of support  Resisted walking, 7.5# forward/backward, side/side x2 laps each direction (4 way) with min A for safety; Patient had increased difficulty due to LE weakness requiring cues for weight shift for eccentric control;   Response to treatment: Tolerate well. Patient does require min VCs for proper exercise technique/positioning for optimal muscle activation. She does fatigue requiring short seated rest breaks; Advanced balance exercise, reducing HHA to challenge stance control;                        PT Education - 07/21/19 1200    Education Details  strength, balance, HEP    Person(s) Educated  Patient    Methods  Explanation;Verbal cues    Comprehension  Verbalized understanding;Returned demonstration;Verbal cues required;Need further  instruction       PT Short Term Goals - 07/07/19 1136      PT SHORT TERM GOAL #1   Title  Pt will be independent with HEP in order to improve strength and balance in order to decrease fall risk and improve function at home.    Time  6    Period  Weeks    Status  New    Target Date  08/18/19        PT Long Term Goals - 07/07/19 1233      PT LONG TERM GOAL #1   Title  Pt will improve BERG by at least 3 points in order to demonstrate clinically significant improvement in balance.    Baseline  07/07/19: 52/56    Time  12    Period  Weeks    Status  New    Target Date  09/29/19      PT LONG TERM GOAL #2   Title  Pt will improve ABC by at least 13% in order to demonstrate clinically significant improvement in balance confidence.    Baseline  07/07/19: 69.4%    Time  12    Period  Weeks    Status  New    Target Date  09/29/19      PT LONG TERM GOAL #3   Title  Pt will decrease 5TSTS by at least 3 seconds in order to demonstrate clinically significant improvement in LE strength.    Baseline  07/07/19: 16.4s    Time  12    Period  Weeks    Status  New    Target Date  09/29/19      PT LONG TERM GOAL #4   Title  Pt will increase self-selected 10MWT by at least 0.13 m/s in order to demonstrate clinically significant improvement in community ambulation.    Baseline  07/07/19: Self-selected: 12.0s = 0.83 m/s    Time  12    Period  Weeks    Status  New    Target Date  09/29/19            Plan - 07/22/19 0819    Clinical Impression Statement  Patient motivated and participated well within session. Instructed patient in advanced balance exercise. progressed exercise with narrow base of support and reducing rail assist. Patient does require min A for safety. She had increased difficulty with dynamic movement. She was able to exhibit better static balance this session with less unsteadiness eyes open/closed. Patient does have increased weakness in BLE. Attempted leg press but  patient unable to tolerate due to increased low back pain. Patient would benefit from additional skilled PT intervention to improve strength, balance and mobility;    Personal Factors and Comorbidities  Age;Comorbidity 3+;Time since onset of injury/illness/exacerbation    Comorbidities  COPD, ulcerative colitis, osteoporosis, lumbar surgery, L hip replacement    Examination-Activity Limitations  Bend;Stairs;Locomotion Level    Examination-Participation Restrictions  Community Activity;Cleaning    Stability/Clinical Decision Making  Evolving/Moderate complexity    Rehab Potential  Fair    PT Frequency  2x / week    PT Duration  12 weeks    PT Treatment/Interventions  ADLs/Self Care Home Management;Biofeedback;Electrical Stimulation;Moist Heat;Gait training;Stair training;Functional mobility training;Therapeutic activities;Therapeutic exercise;Balance training;Neuromuscular re-education;Manual techniques;Dry needling;Taping;Spinal Manipulations;Joint Manipulations;Aquatic Therapy;Canalith Repostioning;Cryotherapy;Iontophoresis 8m/ml Dexamethasone;Traction;Ultrasound;DME Instruction;Patient/family education;Passive range of motion;Vestibular    PT Next Visit Plan  Initiate HEP, balance and strengthening    PT Home Exercise Plan  Pt encouraged to start sit to stands at home, 2 x 10 BID;    Consulted and Agree with Plan of Care  Patient       Patient will benefit from skilled therapeutic intervention in order to improve the following deficits and impairments:  Decreased mobility, Decreased endurance, Decreased strength, Decreased balance, Abnormal gait, Decreased activity tolerance, Impaired perceived functional ability  Visit Diagnosis: Muscle weakness (generalized)  Unsteadiness on feet  Other lack of coordination     Problem List Patient Active Problem List   Diagnosis Date Noted  . Swelling of limb 10/10/2016  . Pain in limb 10/10/2016  . Varicose veins of leg with pain, left  10/10/2016  . Abnormal CT scan of lung 10/29/2015  . Personal history of tobacco use, presenting hazards to health 11/04/2014  . Preop cardiovascular exam 09/11/2011  .  SOB (shortness of breath) 08/21/2011  . Chest pain 08/21/2011  . PVC's (premature ventricular contractions) 08/21/2011    Elaine Middleton PT, DPT 07/22/2019, 8:21 AM  [place] MAIN [facility] SERVICES [address] [place], [place], [address] Phone: [phone]   Fax:  [phone]  Name: CESIA ORF MRN: [id number] Date of Birth: December 24, 1940

## 2019-07-23 ENCOUNTER — Other Ambulatory Visit: Payer: Self-pay

## 2019-07-23 ENCOUNTER — Encounter: Payer: Self-pay | Admitting: Physical Therapy

## 2019-07-23 ENCOUNTER — Ambulatory Visit: Payer: Medicare Other | Admitting: Physical Therapy

## 2019-07-23 DIAGNOSIS — R278 Other lack of coordination: Secondary | ICD-10-CM

## 2019-07-23 DIAGNOSIS — M6281 Muscle weakness (generalized): Secondary | ICD-10-CM | POA: Diagnosis not present

## 2019-07-23 DIAGNOSIS — R2681 Unsteadiness on feet: Secondary | ICD-10-CM

## 2019-07-23 NOTE — Therapy (Signed)
[place] MAIN [facility] SERVICES [address] [place], [place], [address] Phone: [phone]   Fax:  [phone]  Physical Therapy Treatment  Patient Details  Name: Sheila Peters MRN: [id number] Date of Birth: 1941/03/07 Referring Provider (PT): Dr. Manuella Ghazi   Encounter Date: 07/23/2019  PT End of Session - 07/23/19 1151    Visit Number  4    Number of Visits  25    Date for PT Re-Evaluation  09/29/19    Authorization Type  eval 07/07/19    PT Start Time  1148    PT Stop Time  1230    PT Time Calculation (min)  42 min    Equipment Utilized During Treatment  Gait belt    Activity Tolerance  Patient tolerated treatment well    Behavior During Therapy  [facility] for tasks assessed/performed       Past Medical History:  Diagnosis Date  . Arthritis   . Asthma   . Clostridium difficile diarrhea   . Colitis, nonspecific   . COPD (chronic obstructive pulmonary disease) ([place])   . GERD (gastroesophageal reflux disease)   . Hypothyroidism    post-surgical  . Multiple thyroid nodules    post excision  . Nephrolithiasis    spontaneously passed  . Nephrolithiasis   . Non-toxic uninodular goiter   . Osteoporosis, postmenopausal   . Ulcerative colitis, chronic ([place])   . Ulcerative pancolitis without complication [facility])     Past Surgical History:  Procedure Laterality Date  . ABDOMINAL HYSTERECTOMY    . ANKLE SURGERY     left fracture  . BACK SURGERY     herniated disk  . CATARACT EXTRACTION W/PHACO Right 10/23/2018   Procedure: CATARACT EXTRACTION PHACO AND INTRAOCULAR LENS PLACEMENT ([place]) RIGHT;  Surgeon: Leandrew Koyanagi, MD;  Location: [place];  Service: Ophthalmology;  Laterality: Right;  PT WANTS TO BE LAST CASE  . COLONOSCOPY N/A 02/26/2017   Procedure: COLONOSCOPY;  Surgeon: Manya Silvas, MD;  Location: [facility] ENDOSCOPY;  Service: Endoscopy;  Laterality: N/A;  . COLONOSCOPY WITH PROPOFOL N/A 11/02/2014   Procedure:  COLONOSCOPY WITH PROPOFOL;  Surgeon: Josefine Class, MD;  Location: [facility] ENDOSCOPY;  Service: Endoscopy;  Laterality: N/A;  . excision of thyroid nodule    . 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 JOINT REPLACEMENT     2014 lth    There were no vitals filed for this visit.  Subjective Assessment - 07/23/19 1150    Subjective  Patient reports doing okay; She reports feeling a little better after last time with minimal back pain. Patient reports adherence with HEP;    Pertinent History  Pt reports that she is having intermittent episodes of imbalance that have been occurring over the last year. She reports feeling occasionally lightheaded. She denies any presyncopal symptoms. She reports feeling unsteady approximately once/week. She also complains of intermittent SOB from her COPD. She had a cataract removed in July 2020 and feels like her vision is worse after the procedure. Since the procedure she has been back for an eye exam without notable findings. Pt wears reading glasses but otherwise no corrective lenses. Denies any pain upon arrival to evaluation. 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 She was placed on Vit B12, B3, and magnesium. Her aura has not occurred in  a couple months. She reports a history of low back surgery but doesn't recall what was done. She does know that she has a herniated disc. History of L ankle fracture 2003 with ORIF. She gets intermittent foot numbness which only lasts a short period of time.    Limitations  Walking    Diagnostic tests  02/02/19 MRI: Atrophy and small vessel disease. No acute intracranial findings. 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    Patient Stated  Goals  Improve balance    Currently in Pain?  No/denies    Pain Onset  More than a month ago    Multiple Pain Sites  No              TREATMENT: Warm up on Crosstrainer, level 0 x3 min (unbilled);  Exercise:  Standing with 2# ankle weight: -hip flexion march x15 bilaterally; Pt unable to tolerate weight due to increased discomfort in LLE ankle. Therefore patient transitioned to red tband around BLE:  -hip abduction x15 bilaterally -hip extension x15 bilaterally; -side stepping x10 feet x 2 laps each direction;  Patient required min-moderate verbal/tactile cues for correct exercise technique.  Sit<>Stand from chair with BUE ball chest press x10 reps with CGA for safety and cues for forward weight shift to improve transfer ability;   NMR: Instructed patient in balance exercise:  Stepping over orange hurdle  Forward/backward x10 reps with 1-0 rail assist  Side/side x10 reps with 1-0 rail assist Required CGA for safety and mod VCs to increase step length for better hurdle negotiation; Patient did fatigue quickly and had increased difficulty with side stepping;  Weaving around cones #5 x4 laps without AD, CGA for safety and cues for increased step length and to improve turning for better cone negotiation; Pt does exhibit trendelenburg gait with instability noted during weaving/negotiation.  Resisted walking, 7.5# forward/backward, side/side x2 laps each direction (2 way) with min A for safety; Patient had increased difficulty due to LE weakness requiring cues for weight shift for eccentric control;   Response to treatment: Tolerated well. Patient does require min VCs for proper exercise technique/positioning for optimal muscle activation. She does fatigue requiring short seated rest breaks; Advanced balance exercise, reducing HHA and instructing patient in dynamic balance exercise. She did have increased difficulty weaving around cones/turning due to LE weakness and imbalance;                     PT Education - 07/23/19 1151    Education Details  strength, balance, HEP    Person(s) Educated  Patient    Methods  Explanation;Verbal cues    Comprehension  Verbalized understanding;Returned demonstration;Verbal cues required;Need further instruction       PT Short Term Goals - 07/07/19 1136      PT SHORT TERM GOAL #1   Title  Pt will be independent with HEP in order to improve strength and balance in order to decrease fall risk and improve function at home.    Time  6    Period  Weeks    Status  New    Target Date  08/18/19        PT Long Term Goals - 07/07/19 1233      PT LONG TERM GOAL #1   Title  Pt will improve BERG by at least 3 points in order to demonstrate clinically significant improvement in balance.    Baseline  07/07/19:  52/56    Time  12    Period  Weeks    Status  New    Target Date  09/29/19      PT LONG TERM GOAL #2   Title  Pt will improve ABC by at least 13% in order to demonstrate clinically significant improvement in balance confidence.    Baseline  07/07/19: 69.4%    Time  12    Period  Weeks    Status  New    Target Date  09/29/19      PT LONG TERM GOAL #3   Title  Pt will decrease 5TSTS by at least 3 seconds in order to demonstrate clinically significant improvement in LE strength.    Baseline  07/07/19: 16.4s    Time  12    Period  Weeks    Status  New    Target Date  09/29/19      PT LONG TERM GOAL #4   Title  Pt will increase self-selected 10MWT by at least 0.13 m/s in order to demonstrate clinically significant improvement in community ambulation.    Baseline  07/07/19: Self-selected: 12.0s = 0.83 m/s    Time  12    Period  Weeks    Status  New    Target Date  09/29/19            Plan - 07/23/19 1157    Clinical Impression Statement  Patient motivated and participated well within session. She was instructed in advanced LE strengthening exercise. Patient unable to tolerate ankle weight on LLE  due to history of ankle pain; therefore transitioned to tband for better tolerance. Patient also instructed in advanced balance exercise.Progressed with dynamic balance exercise negotiating obstacles. She does require min A and intermittent rail assist for safety; She would benefit from additional skilled PT intervention to improve strength, balance and mobility;    Personal Factors and Comorbidities  Age;Comorbidity 3+;Time since onset of injury/illness/exacerbation    Comorbidities  COPD, ulcerative colitis, osteoporosis, lumbar surgery, L hip replacement    Examination-Activity Limitations  Bend;Stairs;Locomotion Level    Examination-Participation Restrictions  Community Activity;Cleaning    Stability/Clinical Decision Making  Evolving/Moderate complexity    Rehab Potential  Fair    PT Frequency  2x / week    PT Duration  12 weeks    PT Treatment/Interventions  ADLs/Self Care Home Management;Biofeedback;Electrical Stimulation;Moist Heat;Gait training;Stair training;Functional mobility training;Therapeutic activities;Therapeutic exercise;Balance training;Neuromuscular re-education;Manual techniques;Dry needling;Taping;Spinal Manipulations;Joint Manipulations;Aquatic Therapy;Canalith Repostioning;Cryotherapy;Iontophoresis 81m/ml Dexamethasone;Traction;Ultrasound;DME Instruction;Patient/family education;Passive range of motion;Vestibular    PT Next Visit Plan  Initiate HEP, balance and strengthening    PT Home Exercise Plan  Pt encouraged to start sit to stands at home, 2 x 10 BID;    Consulted and Agree with Plan of Care  Patient       Patient will benefit from skilled therapeutic intervention in order to improve the following deficits and impairments:  Decreased mobility, Decreased endurance, Decreased strength, Decreased balance, Abnormal gait, Decreased activity tolerance, Impaired perceived functional ability  Visit Diagnosis: Muscle weakness (generalized)  Unsteadiness on feet  Other  lack of coordination     Problem List Patient Active Problem List   Diagnosis Date Noted  . Swelling of limb 10/10/2016  . Pain in limb 10/10/2016  . Varicose veins of leg with pain, left 10/10/2016  . Abnormal CT scan of lung 10/29/2015  . Personal history of tobacco use, presenting hazards to health 11/04/2014  . Preop cardiovascular exam 09/11/2011  . SOB (  shortness of breath) 08/21/2011  . Chest pain 08/21/2011  . PVC's (premature ventricular contractions) 08/21/2011    Sarye Kath PT, DPT 07/23/2019, 2:41 PM  [place] MAIN [facility] SERVICES [address] [place], [place], [address] Phone: [phone]   Fax:  [phone]  Name: Sheila Peters MRN: [id number] Date of Birth: June 06, 1940

## 2019-07-28 ENCOUNTER — Encounter: Payer: Self-pay | Admitting: Physical Therapy

## 2019-07-28 ENCOUNTER — Ambulatory Visit: Payer: Medicare Other | Attending: Neurology | Admitting: Physical Therapy

## 2019-07-28 ENCOUNTER — Other Ambulatory Visit: Payer: Self-pay

## 2019-07-28 DIAGNOSIS — R2681 Unsteadiness on feet: Secondary | ICD-10-CM | POA: Diagnosis present

## 2019-07-28 DIAGNOSIS — R278 Other lack of coordination: Secondary | ICD-10-CM | POA: Insufficient documentation

## 2019-07-28 DIAGNOSIS — M6281 Muscle weakness (generalized): Secondary | ICD-10-CM | POA: Diagnosis present

## 2019-07-28 DIAGNOSIS — M533 Sacrococcygeal disorders, not elsewhere classified: Secondary | ICD-10-CM | POA: Diagnosis present

## 2019-07-28 DIAGNOSIS — R293 Abnormal posture: Secondary | ICD-10-CM | POA: Diagnosis present

## 2019-07-28 DIAGNOSIS — M62838 Other muscle spasm: Secondary | ICD-10-CM | POA: Diagnosis present

## 2019-07-28 NOTE — Therapy (Signed)
[place] MAIN [facility] SERVICES [address] [place], [place], [address] Phone: [phone]   Fax:  [phone]  Physical Therapy Treatment  Patient Details  Name: Sheila Peters MRN: [id number] Date of Birth: Dec 27, 1940 Referring Provider (PT): Dr. Manuella Ghazi   Encounter Date: 07/28/2019  PT End of Session - 07/28/19 1208    Visit Number  5    Number of Visits  25    Date for PT Re-Evaluation  09/29/19    Authorization Type  eval 07/07/19    PT Start Time  1202    PT Stop Time  1230    PT Time Calculation (min)  28 min    Equipment Utilized During Treatment  Gait belt    Activity Tolerance  Patient tolerated treatment well    Behavior During Therapy  [facility] for tasks assessed/performed       Past Medical History:  Diagnosis Date  . Arthritis   . Asthma   . Clostridium difficile diarrhea   . Colitis, nonspecific   . COPD (chronic obstructive pulmonary disease) ([place])   . GERD (gastroesophageal reflux disease)   . Hypothyroidism    post-surgical  . Multiple thyroid nodules    post excision  . Nephrolithiasis    spontaneously passed  . Nephrolithiasis   . Non-toxic uninodular goiter   . Osteoporosis, postmenopausal   . Ulcerative colitis, chronic ([place])   . Ulcerative pancolitis without complication [facility])     Past Surgical History:  Procedure Laterality Date  . ABDOMINAL HYSTERECTOMY    . ANKLE SURGERY     left fracture  . BACK SURGERY     herniated disk  . CATARACT EXTRACTION W/PHACO Right 10/23/2018   Procedure: CATARACT EXTRACTION PHACO AND INTRAOCULAR LENS PLACEMENT ([place]) RIGHT;  Surgeon: Leandrew Koyanagi, MD;  Location: [place];  Service: Ophthalmology;  Laterality: Right;  PT WANTS TO BE LAST CASE  . COLONOSCOPY N/A 02/26/2017   Procedure: COLONOSCOPY;  Surgeon: Manya Silvas, MD;  Location: [facility] ENDOSCOPY;  Service: Endoscopy;  Laterality: N/A;  . COLONOSCOPY WITH PROPOFOL N/A 11/02/2014   Procedure:  COLONOSCOPY WITH PROPOFOL;  Surgeon: Josefine Class, MD;  Location: [facility] ENDOSCOPY;  Service: Endoscopy;  Laterality: N/A;  . excision of thyroid nodule    . 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 JOINT REPLACEMENT     2014 lth    There were no vitals filed for this visit.  Subjective Assessment - 07/28/19 1206    Subjective  Patient reports doing well. She reports some soreness in her neck. She denies any new falls or stumbles.    Pertinent History  Pt reports that she is having intermittent episodes of imbalance that have been occurring over the last year. She reports feeling occasionally lightheaded. She denies any presyncopal symptoms. She reports feeling unsteady approximately once/week. She also complains of intermittent SOB from her COPD. She had a cataract removed in July 2020 and feels like her vision is worse after the procedure. Since the procedure she has been back for an eye exam without notable findings. Pt wears reading glasses but otherwise no corrective lenses. Denies any pain upon arrival to evaluation. 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 She was placed on Vit B12, B3, and magnesium. Her aura has not occurred in a couple months. She  reports a history of low back surgery but doesn't recall what was done. She does know that she has a herniated disc. History of L ankle fracture 2003 with ORIF. She gets intermittent foot numbness which only lasts a short period of time.    Limitations  Walking    Diagnostic tests  02/02/19 MRI: Atrophy and small vessel disease. No acute intracranial findings. 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    Patient Stated Goals  Improve balance    Currently  in Pain?  Yes    Pain Score  3     Pain Location  Neck    Pain Descriptors / Indicators  Aching;Sore    Pain Type  Acute pain    Pain Onset  Today    Pain Frequency  Intermittent    Aggravating Factors   slept wrong last night    Pain Relieving Factors  movement    Effect of Pain on Daily Activities  no change;    Multiple Pain Sites  No              TREATMENT: Warm up on Crosstrainer, level 0 x3 min (unbilled);  Exercise:  Red tband around BLE hip abduction x10 reps each LE with min VCs to avoid turning foot in ER for better hip abductor strengthening;   NMR: Instructed patient in balance exercise:  Airex beam: -tandem walk x6 laps -side stepping x3 laps each direction; -tandem stance with head turns side/side #3 each, x2 laps Patient required min VCs for balance stability, including to increase trunk control for less loss of balance with smaller base of support  Weaving around cones #5 x4 laps without AD, CGA for safety and cues for increased step length and to improve turning for better cone negotiation; Pt does exhibit trendelenburg gait with instability noted during weaving/negotiation.  Resisted walking, 7.5# forward/backward, side/side x2 laps each direction (4 way) with min A for safety; Patient had increased difficulty due to LE weakness requiring cues for weight shift for eccentric control;  Response to treatment: Tolerated well. Patient does require min VCs for proper exercise technique/positioning for optimal muscle activation. She does fatigue requiring short seated rest breaks;Advanced balance exercise, reducing HHA and instructing patient in dynamic balance exercise. She did have increased difficulty weaving around cones/turning due to LE weakness and imbalance;                    PT Education - 07/28/19 1207    Education Details  balance/strength, HEP    Person(s) Educated  Patient    Methods  Explanation;Verbal cues     Comprehension  Verbalized understanding;Returned demonstration;Verbal cues required;Need further instruction       PT Short Term Goals - 07/07/19 1136      PT SHORT TERM GOAL #1   Title  Pt will be independent with HEP in order to improve strength and balance in order to decrease fall risk and improve function at home.    Time  6    Period  Weeks    Status  New    Target Date  08/18/19        PT Long Term Goals - 07/07/19 1233      PT LONG TERM GOAL #1   Title  Pt will improve BERG by at least 3 points in order to demonstrate clinically significant improvement in balance.    Baseline  07/07/19: 52/56  Time  12    Period  Weeks    Status  New    Target Date  09/29/19      PT LONG TERM GOAL #2   Title  Pt will improve ABC by at least 13% in order to demonstrate clinically significant improvement in balance confidence.    Baseline  07/07/19: 69.4%    Time  12    Period  Weeks    Status  New    Target Date  09/29/19      PT LONG TERM GOAL #3   Title  Pt will decrease 5TSTS by at least 3 seconds in order to demonstrate clinically significant improvement in LE strength.    Baseline  07/07/19: 16.4s    Time  12    Period  Weeks    Status  New    Target Date  09/29/19      PT LONG TERM GOAL #4   Title  Pt will increase self-selected 10MWT by at least 0.13 m/s in order to demonstrate clinically significant improvement in community ambulation.    Baseline  07/07/19: Self-selected: 12.0s = 0.83 m/s    Time  12    Period  Weeks    Status  New    Target Date  09/29/19            Plan - 07/28/19 1256    Clinical Impression Statement  Patient motivated and participated well within session. She was instructed in advanced balance exercise. She does have difficulty with dynamic tasks, often falling to right side. This could be related to weakness in hip abductors. reinforced HEP with instruction to focus on hip abductor strengthening. She would benefit from additional skilled PT  intervention to improve strength, balance and mobility;    Personal Factors and Comorbidities  Age;Comorbidity 3+;Time since onset of injury/illness/exacerbation    Comorbidities  COPD, ulcerative colitis, osteoporosis, lumbar surgery, L hip replacement    Examination-Activity Limitations  Bend;Stairs;Locomotion Level    Examination-Participation Restrictions  Community Activity;Cleaning    Stability/Clinical Decision Making  Evolving/Moderate complexity    Rehab Potential  Fair    PT Frequency  2x / week    PT Duration  12 weeks    PT Treatment/Interventions  ADLs/Self Care Home Management;Biofeedback;Electrical Stimulation;Moist Heat;Gait training;Stair training;Functional mobility training;Therapeutic activities;Therapeutic exercise;Balance training;Neuromuscular re-education;Manual techniques;Dry needling;Taping;Spinal Manipulations;Joint Manipulations;Aquatic Therapy;Canalith Repostioning;Cryotherapy;Iontophoresis 29m/ml Dexamethasone;Traction;Ultrasound;DME Instruction;Patient/family education;Passive range of motion;Vestibular    PT Next Visit Plan  Initiate HEP, balance and strengthening    PT Home Exercise Plan  Pt encouraged to start sit to stands at home, 2 x 10 BID;    Consulted and Agree with Plan of Care  Patient       Patient will benefit from skilled therapeutic intervention in order to improve the following deficits and impairments:  Decreased mobility, Decreased endurance, Decreased strength, Decreased balance, Abnormal gait, Decreased activity tolerance, Impaired perceived functional ability  Visit Diagnosis: Muscle weakness (generalized)  Unsteadiness on feet  Other lack of coordination     Problem List Patient Active Problem List   Diagnosis Date Noted  . Swelling of limb 10/10/2016  . Pain in limb 10/10/2016  . Varicose veins of leg with pain, left 10/10/2016  . Abnormal CT scan of lung 10/29/2015  . Personal history of tobacco use, presenting hazards to  health 11/04/2014  . Preop cardiovascular exam 09/11/2011  . SOB (shortness of breath) 08/21/2011  . Chest pain 08/21/2011  . PVC's (premature ventricular contractions) 08/21/2011  Jaylianna Tatlock PT, DPT 07/28/2019, 12:58 PM  [place] MAIN [facility] SERVICES [address] [place], [place], [address] Phone: [phone]   Fax:  [phone]  Name: LILLYAUNA JENKINSON MRN: [id number] Date of Birth: 09/24/1940

## 2019-07-30 ENCOUNTER — Ambulatory Visit: Payer: Medicare Other | Admitting: Physical Therapy

## 2019-08-04 ENCOUNTER — Ambulatory Visit: Payer: Medicare Other | Admitting: Physical Therapy

## 2019-08-04 ENCOUNTER — Encounter: Payer: Self-pay | Admitting: Physical Therapy

## 2019-08-04 ENCOUNTER — Other Ambulatory Visit: Payer: Self-pay

## 2019-08-04 DIAGNOSIS — R2681 Unsteadiness on feet: Secondary | ICD-10-CM

## 2019-08-04 DIAGNOSIS — M6281 Muscle weakness (generalized): Secondary | ICD-10-CM | POA: Diagnosis not present

## 2019-08-04 NOTE — Therapy (Signed)
[place] MAIN [facility] SERVICES [address] [place], [place], [address] Phone: [phone]   Fax:  [phone]  Physical Therapy Treatment  Patient Details  Name: Sheila Peters MRN: [id number] Date of Birth: [birth year] Referring Provider (PT): Dr. Manuella Ghazi   Encounter Date: 08/04/2019  PT End of Session - 08/04/19 1155    Visit Number  6    Number of Visits  25    Date for PT Re-Evaluation  09/29/19    Authorization Type  eval 07/07/19    PT Start Time  1147    PT Stop Time  1230    PT Time Calculation (min)  43 min    Equipment Utilized During Treatment  Gait belt    Activity Tolerance  Patient tolerated treatment well    Behavior During Therapy  [facility] for tasks assessed/performed       Past Medical History:  Diagnosis Date  . Arthritis   . Asthma   . Clostridium difficile diarrhea   . Colitis, nonspecific   . COPD (chronic obstructive pulmonary disease) ([place])   . GERD (gastroesophageal reflux disease)   . Hypothyroidism    post-surgical  . Multiple thyroid nodules    post excision  . Nephrolithiasis    spontaneously passed  . Nephrolithiasis   . Non-toxic uninodular goiter   . Osteoporosis, postmenopausal   . Ulcerative colitis, chronic ([place])   . Ulcerative pancolitis without complication [facility])     Past Surgical History:  Procedure Laterality Date  . ABDOMINAL HYSTERECTOMY    . ANKLE SURGERY     left fracture  . BACK SURGERY     herniated disk  . CATARACT EXTRACTION W/PHACO Right 10/23/2018   Procedure: CATARACT EXTRACTION PHACO AND INTRAOCULAR LENS PLACEMENT ([place]) RIGHT;  Surgeon: Leandrew Koyanagi, MD;  Location: [place];  Service: Ophthalmology;  Laterality: Right;  PT WANTS TO BE LAST CASE  . COLONOSCOPY N/A 02/26/2017   Procedure: COLONOSCOPY;  Surgeon: Manya Silvas, MD;  Location: [facility] ENDOSCOPY;  Service: Endoscopy;  Laterality: N/A;  . COLONOSCOPY WITH PROPOFOL N/A 11/02/2014   Procedure:  COLONOSCOPY WITH PROPOFOL;  Surgeon: Josefine Class, MD;  Location: [facility] ENDOSCOPY;  Service: Endoscopy;  Laterality: N/A;  . excision of thyroid nodule    . 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 JOINT REPLACEMENT     2014 lth    There were no vitals filed for this visit.  Subjective Assessment - 08/04/19 1153    Subjective  Patient reports feeling increased stiffness today. She reports having a headache over the weekend. Denies any new falls.    Pertinent History  Pt reports that she is having intermittent episodes of imbalance that have been occurring over the last year. She reports feeling occasionally lightheaded. She denies any presyncopal symptoms. She reports feeling unsteady approximately once/week. She also complains of intermittent SOB from her COPD. She had a cataract removed in July 2020 and feels like her vision is worse after the procedure. Since the procedure she has been back for an eye exam without notable findings. Pt wears reading glasses but otherwise no corrective lenses. Denies any pain upon arrival to evaluation. 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 She was placed on Vit B12, B3, and magnesium. Her aura has not occurred in a couple months. She  reports a history of low back surgery but doesn't recall what was done. She does know that she has a herniated disc. History of L ankle fracture 2003 with ORIF. She gets intermittent foot numbness which only lasts a short period of time.    Limitations  Walking    Diagnostic tests  02/02/19 MRI: Atrophy and small vessel disease. No acute intracranial findings. 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    Patient Stated Goals  Improve  balance    Currently in Pain?  No/denies    Pain Onset  Today    Multiple Pain Sites  No           TREATMENT: Warm up on Nustep  BUE/BLE level 2 x4 min (Unbilled);   Exercise:  Seated with 2# ankle weight:( weight around left foot to avoid ankle discomfort)  -hip flexion march 2x15 bilaterally; -LAQ 2x15  Bilaterally; Patient required min-moderate verbal/tactile cues for correct exercise technique.  Standing with red tband around BLE just below knees for better comfort: -side stepping x10 feet x 3laps each direction with intermittent rail assist; -hip abduction x15 reps each LE with cues for hip positioning to avoid trendelenburg gait;   NMR: Instructed patient in balance exercise:  Stepping over orange hurdle #2 with reciprocal steps;              Forward x6 laps without rail assist;             Side step  x4 reps each direction with 0 rail assist Required CGA for safety and mod VCs to increase step length for better hurdle negotiation; Patient did fatigue quickly and had increased difficulty with side stepping;  Weaving around cones #5 x4 laps without AD, CGA for safety and cues for increased step length and to improve turning for better cone negotiation;  Resisted walking, 7.5# forward/backward, side/side x2 laps each direction (2 way) with min A for safety; Patient had increased difficulty due to LE weakness requiring cues for weight shift for eccentric control;  Standing in parallel bars: Rockerboard: Forward/backward teeter x2 min with 2-1 rail assist Side/side teeter x2 min with 2 HHA Required CGA for safety and cues for weight shift to improve stance control. Patient also required cues for proper positioning to isolate ankle strategies to improve balance recovery;   Response to treatment: Tolerated well. Patient does require min VCs for proper exercise technique/positioning for optimal muscle activation. She does fatigue requiring short seated rest  breaks;Advanced balance exercise, reducing HHA and instructing patient in dynamic balance exercise. She required CGA for most balance exercise, however able to progress balance with less rail assist.                       PT Education - 08/04/19 1154    Education Details  balance/strength, HEP    Person(s) Educated  Patient    Methods  Explanation;Verbal cues    Comprehension  Verbalized understanding;Returned demonstration;Verbal cues required;Need further instruction       PT Short Term Goals - 07/07/19 1136      PT SHORT TERM GOAL #1   Title  Pt will be independent with HEP in order to improve strength and balance in order to decrease fall risk and improve function at home.    Time  6    Period  Weeks    Status  New    Target Date  08/18/19        PT Long Term Goals - 07/07/19 1233      PT LONG TERM GOAL #1   Title  Pt will improve BERG by at least 3 points in order to demonstrate clinically significant improvement in balance.    Baseline  07/07/19: 52/56    Time  12    Period  Weeks    Status  New    Target Date  09/29/19      PT LONG TERM GOAL #2   Title  Pt will improve ABC by at least 13% in order to demonstrate clinically significant improvement in balance confidence.    Baseline  07/07/19: 69.4%    Time  12    Period  Weeks    Status  New    Target Date  09/29/19      PT LONG TERM GOAL #3   Title  Pt will decrease 5TSTS by at least 3 seconds in order to demonstrate clinically significant improvement in LE strength.    Baseline  07/07/19: 16.4s    Time  12    Period  Weeks    Status  New    Target Date  09/29/19      PT LONG TERM GOAL #4   Title  Pt will increase self-selected 10MWT by at least 0.13 m/s in order to demonstrate clinically significant improvement in community ambulation.    Baseline  07/07/19: Self-selected: 12.0s = 0.83 m/s    Time  12    Period  Weeks    Status  New    Target Date  09/29/19            Plan -  08/04/19 1321    Clinical Impression Statement  Patient motivated and participated well within session. Instructed patient in advanced balance exercise with instruction in dynamic movement. She was able to exhibit improved balance when weaving around cones although did have difficulty with cones close together. She was also able to step over hurdles well reciprocally, requiring CGA for safety and cues to increase step length for better foot clearance. Patient more fatigued this session and required intermittent rest breaks. She would benefit from additional skilled PT intervention to improve strength, balance and mobility;    Personal Factors and Comorbidities  Age;Comorbidity 3+;Time since onset of injury/illness/exacerbation    Comorbidities  COPD, ulcerative colitis, osteoporosis, lumbar surgery, L hip replacement    Examination-Activity Limitations  Bend;Stairs;Locomotion Level    Examination-Participation Restrictions  Community Activity;Cleaning    Stability/Clinical Decision Making  Evolving/Moderate complexity    Rehab Potential  Fair    PT Frequency  2x / week    PT Duration  12 weeks    PT Treatment/Interventions  ADLs/Self Care Home Management;Biofeedback;Electrical Stimulation;Moist Heat;Gait training;Stair training;Functional mobility training;Therapeutic activities;Therapeutic exercise;Balance training;Neuromuscular re-education;Manual techniques;Dry needling;Taping;Spinal Manipulations;Joint Manipulations;Aquatic Therapy;Canalith Repostioning;Cryotherapy;Iontophoresis 67m/ml Dexamethasone;Traction;Ultrasound;DME Instruction;Patient/family education;Passive range of motion;Vestibular    PT Next Visit Plan  Initiate HEP, balance and strengthening    PT Home Exercise Plan  Pt encouraged to start sit to stands at home, 2 x 10 BID;    Consulted and Agree with Plan of Care  Patient       Patient will benefit from skilled therapeutic intervention in order to improve the following deficits  and impairments:  Decreased mobility, Decreased endurance, Decreased strength, Decreased balance, Abnormal gait, Decreased activity tolerance, Impaired perceived functional ability  Visit Diagnosis: Muscle weakness (generalized)  Unsteadiness on feet     Problem List Patient  Active Problem List   Diagnosis Date Noted  . Swelling of limb 10/10/2016  . Pain in limb 10/10/2016  . Varicose veins of leg with pain, left 10/10/2016  . Abnormal CT scan of lung 10/29/2015  . Personal history of tobacco use, presenting hazards to health 11/04/2014  . Preop cardiovascular exam 09/11/2011  . SOB (shortness of breath) 08/21/2011  . Chest pain 08/21/2011  . PVC's (premature ventricular contractions) 08/21/2011    Hilary Pundt PT, DPT 08/04/2019, 1:23 PM  [place] [facility] MAIN [facility] SERVICES [address] [place], [place], [address] Phone: [phone]   Fax:  [phone]  Name: Sheila Peters MRN: [id number] Date of Birth: 1940-12-24

## 2019-08-06 ENCOUNTER — Encounter: Payer: Self-pay | Admitting: Physical Therapy

## 2019-08-06 ENCOUNTER — Ambulatory Visit: Payer: Medicare Other | Admitting: Physical Therapy

## 2019-08-06 ENCOUNTER — Other Ambulatory Visit: Payer: Self-pay

## 2019-08-06 DIAGNOSIS — M6281 Muscle weakness (generalized): Secondary | ICD-10-CM

## 2019-08-06 DIAGNOSIS — R278 Other lack of coordination: Secondary | ICD-10-CM

## 2019-08-06 DIAGNOSIS — R2681 Unsteadiness on feet: Secondary | ICD-10-CM

## 2019-08-06 NOTE — Therapy (Signed)
[place] MAIN [facility] SERVICES [address] [place], [place], [address] Phone: [phone]   Fax:  [phone]  Physical Therapy Treatment  Patient Details  Name: Sheila Peters MRN: [id number] Date of Birth: November 01, 1940 Referring Provider (PT): Dr. Manuella Ghazi   Encounter Date: 08/06/2019  PT End of Session - 08/06/19 1151    Visit Number  7    Number of Visits  25    Date for PT Re-Evaluation  09/29/19    Authorization Type  eval 07/07/19    PT Start Time  1146    PT Stop Time  1230    PT Time Calculation (min)  44 min    Equipment Utilized During Treatment  Gait belt    Activity Tolerance  Patient tolerated treatment well    Behavior During Therapy  [facility] for tasks assessed/performed       Past Medical History:  Diagnosis Date  . Arthritis   . Asthma   . Clostridium difficile diarrhea   . Colitis, nonspecific   . COPD (chronic obstructive pulmonary disease) ([place])   . GERD (gastroesophageal reflux disease)   . Hypothyroidism    post-surgical  . Multiple thyroid nodules    post excision  . Nephrolithiasis    spontaneously passed  . Nephrolithiasis   . Non-toxic uninodular goiter   . Osteoporosis, postmenopausal   . Ulcerative colitis, chronic ([place])   . Ulcerative pancolitis without complication [facility])     Past Surgical History:  Procedure Laterality Date  . ABDOMINAL HYSTERECTOMY    . ANKLE SURGERY     left fracture  . BACK SURGERY     herniated disk  . CATARACT EXTRACTION W/PHACO Right 10/23/2018   Procedure: CATARACT EXTRACTION PHACO AND INTRAOCULAR LENS PLACEMENT ([place]) RIGHT;  Surgeon: Leandrew Koyanagi, MD;  Location: [place];  Service: Ophthalmology;  Laterality: Right;  PT WANTS TO BE LAST CASE  . COLONOSCOPY N/A 02/26/2017   Procedure: COLONOSCOPY;  Surgeon: Manya Silvas, MD;  Location: [facility] ENDOSCOPY;  Service: Endoscopy;  Laterality: N/A;  . COLONOSCOPY WITH PROPOFOL N/A 11/02/2014   Procedure:  COLONOSCOPY WITH PROPOFOL;  Surgeon: Josefine Class, MD;  Location: [facility] ENDOSCOPY;  Service: Endoscopy;  Laterality: N/A;  . excision of thyroid nodule    . 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 JOINT REPLACEMENT     2014 lth    There were no vitals filed for this visit.  Subjective Assessment - 08/06/19 1150    Subjective  Patient reports doing okay today; She reports not exercising yesterday, just resting. She denies any new falls. Reports mild stiffness but no significant pain;    Pertinent History  Pt reports that she is having intermittent episodes of imbalance that have been occurring over the last year. She reports feeling occasionally lightheaded. She denies any presyncopal symptoms. She reports feeling unsteady approximately once/week. She also complains of intermittent SOB from her COPD. She had a cataract removed in July 2020 and feels like her vision is worse after the procedure. Since the procedure she has been back for an eye exam without notable findings. Pt wears reading glasses but otherwise no corrective lenses. Denies any pain upon arrival to evaluation. 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 She was placed on Vit B12, B3, and magnesium. Her aura has not  occurred in a couple months. She reports a history of low back surgery but doesn't recall what was done. She does know that she has a herniated disc. History of L ankle fracture 2003 with ORIF. She gets intermittent foot numbness which only lasts a short period of time.    Limitations  Walking    Diagnostic tests  02/02/19 MRI: Atrophy and small vessel disease. No acute intracranial findings. 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     Patient Stated Goals  Improve balance    Currently in Pain?  No/denies    Pain Onset  Today    Multiple Pain Sites  No                 TREATMENT: Warm up on Nustep  BUE/BLE level 2 x4 min (Unbilled);  Pt reports increased shortness of breath, SPO2 95%, HR 72 bpm;   Exercise:  Standing with red tband around BLE just below knees for better comfort: -side stepping x10 feet x 2 laps each direction with intermittent rail assist; -hip abduction x20 reps each LE with cues for hip positioning to avoid trendelenburg gait; -Hip extension x20 reps bilaterally;  Forward lunges x10 reps each LE with rail assist for safety; Required min VCs for correct positioning/exercise technique;   Ascend/descend 4 steps with B rail assist, forward reciprocal x3 sets with good LE positioning and control; Mild fatigue reported;  Sit<>Stand from chair with BUE ball chest press x10 reps with CGA for safety;   NMR: Instructed patient in balance exercise:  Weaving around cones #5 and then stepping over hurdle x4 laps without AD, CGA for safety and cues for increased step length and to improve turning for better cone negotiation;  Resisted walking, 7.5# forward/backward, side/side x2 laps each direction (4 way) with min A for safety; Patient had increased difficulty due to LE weakness requiring cues for weight shift for eccentric control;  Forward/backward walking x40 feet with small ball toss/catch x1 set with CGA for safety; No veering noted;     Response to treatment: Toleratedfair. Patient does require min VCs for proper exercise technique/positioning for optimal muscle activation. She does fatigue requiring short seated rest breaks;Advanced balance exercise, reducing HHA and instructing patient in dynamic balance exercise. She required CGA for most balance exercise, however able to progress balance with less rail assist.                   PT Education - 08/06/19 1151     Education Details  balance/strength, HEP    Person(s) Educated  Patient    Methods  Explanation;Verbal cues    Comprehension  Verbalized understanding;Returned demonstration;Verbal cues required;Need further instruction       PT Short Term Goals - 07/07/19 1136      PT SHORT TERM GOAL #1   Title  Pt will be independent with HEP in order to improve strength and balance in order to decrease fall risk and improve function at home.    Time  6    Period  Weeks    Status  New    Target Date  08/18/19        PT Long Term Goals - 07/07/19 1233      PT LONG TERM GOAL #1   Title  Pt will improve BERG by at least 3 points in order to demonstrate clinically significant improvement in balance.    Baseline  07/07/19:  52/56    Time  12    Period  Weeks    Status  New    Target Date  09/29/19      PT LONG TERM GOAL #2   Title  Pt will improve ABC by at least 13% in order to demonstrate clinically significant improvement in balance confidence.    Baseline  07/07/19: 69.4%    Time  12    Period  Weeks    Status  New    Target Date  09/29/19      PT LONG TERM GOAL #3   Title  Pt will decrease 5TSTS by at least 3 seconds in order to demonstrate clinically significant improvement in LE strength.    Baseline  07/07/19: 16.4s    Time  12    Period  Weeks    Status  New    Target Date  09/29/19      PT LONG TERM GOAL #4   Title  Pt will increase self-selected 10MWT by at least 0.13 m/s in order to demonstrate clinically significant improvement in community ambulation.    Baseline  07/07/19: Self-selected: 12.0s = 0.83 m/s    Time  12    Period  Weeks    Status  New    Target Date  09/29/19            Plan - 08/06/19 1338    Clinical Impression Statement  Patient motivated and participated well within session. She did seem more fatigued today but was able to tolerate advanced exercise. She required min VCs for correct positioning and exercise technique. Patient does require short  seated rest break due to shortness of breath/fatigue. Vitals assessed with good SPO2/HR. She was instructed in advanced balance exercise challenging dynamic balance movement. Patient does require CGA especially without rail assist. She would benefit from additional skilled PT intervention to improve strength, balance and mobility;    Personal Factors and Comorbidities  Age;Comorbidity 3+;Time since onset of injury/illness/exacerbation    Comorbidities  COPD, ulcerative colitis, osteoporosis, lumbar surgery, L hip replacement    Examination-Activity Limitations  Bend;Stairs;Locomotion Level    Examination-Participation Restrictions  Community Activity;Cleaning    Stability/Clinical Decision Making  Evolving/Moderate complexity    Rehab Potential  Fair    PT Frequency  2x / week    PT Duration  12 weeks    PT Treatment/Interventions  ADLs/Self Care Home Management;Biofeedback;Electrical Stimulation;Moist Heat;Gait training;Stair training;Functional mobility training;Therapeutic activities;Therapeutic exercise;Balance training;Neuromuscular re-education;Manual techniques;Dry needling;Taping;Spinal Manipulations;Joint Manipulations;Aquatic Therapy;Canalith Repostioning;Cryotherapy;Iontophoresis 46m/ml Dexamethasone;Traction;Ultrasound;DME Instruction;Patient/family education;Passive range of motion;Vestibular    PT Next Visit Plan  Initiate HEP, balance and strengthening    PT Home Exercise Plan  Pt encouraged to start sit to stands at home, 2 x 10 BID;    Consulted and Agree with Plan of Care  Patient       Patient will benefit from skilled therapeutic intervention in order to improve the following deficits and impairments:  Decreased mobility, Decreased endurance, Decreased strength, Decreased balance, Abnormal gait, Decreased activity tolerance, Impaired perceived functional ability  Visit Diagnosis: Muscle weakness (generalized)  Unsteadiness on feet  Other lack of  coordination     Problem List Patient Active Problem List   Diagnosis Date Noted  . Swelling of limb 10/10/2016  . Pain in limb 10/10/2016  . Varicose veins of leg with pain, left 10/10/2016  . Abnormal CT scan of lung 10/29/2015  . Personal history of tobacco use, presenting hazards to health 11/04/2014  . Preop  cardiovascular exam 09/11/2011  . SOB (shortness of breath) 08/21/2011  . Chest pain 08/21/2011  . PVC's (premature ventricular contractions) 08/21/2011    Makiyah Zentz PT, DPT 08/06/2019, 1:41 PM  [place] MAIN [facility] SERVICES [address] [place], [place], [address] Phone: [phone]   Fax:  [phone]  Name: Sheila Peters MRN: [id number] Date of Birth: [birth year]

## 2019-08-11 ENCOUNTER — Other Ambulatory Visit: Payer: Self-pay

## 2019-08-11 ENCOUNTER — Ambulatory Visit: Payer: Medicare Other | Admitting: Physical Therapy

## 2019-08-11 ENCOUNTER — Encounter: Payer: Self-pay | Admitting: Physical Therapy

## 2019-08-11 DIAGNOSIS — R2681 Unsteadiness on feet: Secondary | ICD-10-CM

## 2019-08-11 DIAGNOSIS — R278 Other lack of coordination: Secondary | ICD-10-CM

## 2019-08-11 DIAGNOSIS — M6281 Muscle weakness (generalized): Secondary | ICD-10-CM

## 2019-08-11 NOTE — Therapy (Signed)
[place] MAIN [facility] SERVICES [address] [place], [place], [address] Phone: [phone]   Fax:  [phone]  Physical Therapy Treatment  Patient Details  Name: Sheila Peters MRN: [id number] Date of Birth: [birth year] Referring Provider (PT): Dr. Manuella Ghazi   Encounter Date: 08/11/2019  PT End of Session - 08/11/19 1147    Visit Number  8    Number of Visits  25    Date for PT Re-Evaluation  09/29/19    Authorization Type  eval 07/07/19    PT Start Time  1146    PT Stop Time  1230    PT Time Calculation (min)  44 min    Equipment Utilized During Treatment  Gait belt    Activity Tolerance  Patient tolerated treatment well    Behavior During Therapy  [facility] for tasks assessed/performed       Past Medical History:  Diagnosis Date  . Arthritis   . Asthma   . Clostridium difficile diarrhea   . Colitis, nonspecific   . COPD (chronic obstructive pulmonary disease) ([place])   . GERD (gastroesophageal reflux disease)   . Hypothyroidism    post-surgical  . Multiple thyroid nodules    post excision  . Nephrolithiasis    spontaneously passed  . Nephrolithiasis   . Non-toxic uninodular goiter   . Osteoporosis, postmenopausal   . Ulcerative colitis, chronic ([place])   . Ulcerative pancolitis without complication [facility])     Past Surgical History:  Procedure Laterality Date  . ABDOMINAL HYSTERECTOMY    . ANKLE SURGERY     left fracture  . BACK SURGERY     herniated disk  . CATARACT EXTRACTION W/PHACO Right 10/23/2018   Procedure: CATARACT EXTRACTION PHACO AND INTRAOCULAR LENS PLACEMENT ([place]) RIGHT;  Surgeon: Leandrew Koyanagi, MD;  Location: [place];  Service: Ophthalmology;  Laterality: Right;  PT WANTS TO BE LAST CASE  . COLONOSCOPY N/A 02/26/2017   Procedure: COLONOSCOPY;  Surgeon: Manya Silvas, MD;  Location: [facility] ENDOSCOPY;  Service: Endoscopy;  Laterality: N/A;  . COLONOSCOPY WITH PROPOFOL N/A 11/02/2014   Procedure:  COLONOSCOPY WITH PROPOFOL;  Surgeon: Josefine Class, MD;  Location: [facility] ENDOSCOPY;  Service: Endoscopy;  Laterality: N/A;  . excision of thyroid nodule    . 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 JOINT REPLACEMENT     2014 lth    There were no vitals filed for this visit.  Subjective Assessment - 08/11/19 1154    Subjective  Patient reports having a good weekend. She reports getting to go to church for the first time in a year. She reports doing okay with walking. She has some mild stiffness and soreness.    Pertinent History  Pt reports that she is having intermittent episodes of imbalance that have been occurring over the last year. She reports feeling occasionally lightheaded. She denies any presyncopal symptoms. She reports feeling unsteady approximately once/week. She also complains of intermittent SOB from her COPD. She had a cataract removed in July 2020 and feels like her vision is worse after the procedure. Since the procedure she has been back for an eye exam without notable findings. Pt wears reading glasses but otherwise no corrective lenses. Denies any pain upon arrival to evaluation. 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 She was placed on  Vit B12, B3, and magnesium. Her aura has not occurred in a couple months. She reports a history of low back surgery but doesn't recall what was done. She does know that she has a herniated disc. History of L ankle fracture 2003 with ORIF. She gets intermittent foot numbness which only lasts a short period of time.    Limitations  Walking    Diagnostic tests  02/02/19 MRI: Atrophy and small vessel disease. No acute intracranial findings. Prominent perivascular spaces throughout the basal ganglia and dolichoectasia of the intracranial vasculature, likely sequelae  of chronic hypertension.    Patient Stated Goals  Improve balance    Currently in Pain?  No/denies    Pain Onset  Today    Multiple Pain Sites  No          TREATMENT: Warm up on Nustep BUE/BLE level 2 x4 min (Unbilled);  Pt reports increased shortness of breath, SPO2 95%, HR 72 bpm;    NMR: Instructed patient in balance exercise:  Standing in parallel bars: Standing on airex beam:  -tandem stance unsupported, head turns side/side #3, x4 reps each foot in front with CGA for safety and cues for gaze stabilization;  -side stepping unsupported x3 laps with cues to keep head erect to challenge stance control;  -feet apart: balloon taps x2 min with close supervision and intermittent rail assist;   Standing on 1/2 bolster: Feet apart:  Heel/toe rocks x1 min;  Feet in neutral, BUE wand flexion x15 reps; Tandem stance;   Weaving around cones #5 and then stepping over hurdle x4 laps without AD, CGA to close supervision for safety and cues for increased step length and to improve turning for better cone negotiation;  Side stepping over cones #5 x1 lap each direction with min A for safety and min Vcs to increase step length for better foot clearance;   Resisted walking, 12.5# forward/backward, side/side x2 laps each direction (4 way) with min A for safety; Patient had increased difficulty due to LE weakness requiring cues for weight shift for eccentric control;    Response to treatment: Toleratedfair. Patient does require min VCs for proper exercise technique/positioning for optimal muscle activation. She does fatigue requiring short seated rest breaks;Advanced balance exercise, reducing HHA and instructing patient in dynamic balance exercise.She required CGA to min A  for most balance exercise, however able to progress balance with less rail assist.                        PT Education - 08/11/19 1146    Education Details  balance/strength, HEP     Person(s) Educated  Patient    Methods  Explanation;Verbal cues    Comprehension  Verbalized understanding;Returned demonstration;Verbal cues required;Need further instruction       PT Short Term Goals - 07/07/19 1136      PT SHORT TERM GOAL #1   Title  Pt will be independent with HEP in order to improve strength and balance in order to decrease fall risk and improve function at home.    Time  6    Period  Weeks    Status  New    Target Date  08/18/19        PT Long Term Goals - 07/07/19 1233      PT LONG TERM GOAL #1   Title  Pt will improve BERG by at least 3 points in order to demonstrate clinically significant improvement in balance.  Baseline  07/07/19: 52/56    Time  12    Period  Weeks    Status  New    Target Date  09/29/19      PT LONG TERM GOAL #2   Title  Pt will improve ABC by at least 13% in order to demonstrate clinically significant improvement in balance confidence.    Baseline  07/07/19: 69.4%    Time  12    Period  Weeks    Status  New    Target Date  09/29/19      PT LONG TERM GOAL #3   Title  Pt will decrease 5TSTS by at least 3 seconds in order to demonstrate clinically significant improvement in LE strength.    Baseline  07/07/19: 16.4s    Time  12    Period  Weeks    Status  New    Target Date  09/29/19      PT LONG TERM GOAL #4   Title  Pt will increase self-selected 10MWT by at least 0.13 m/s in order to demonstrate clinically significant improvement in community ambulation.    Baseline  07/07/19: Self-selected: 12.0s = 0.83 m/s    Time  12    Period  Weeks    Status  New    Target Date  09/29/19         Plan - 08/13/19 0630    Clinical Impression Statement  Patient motivated and participated well within session. Advanced balance exercise with increased dynamic movement especially on compliant surfaces. She was able to progress weaving around cones to close supervision at times with less unsteadiness. Patient does continue to require min  A for some balance exercise. She would benefit from additional skilled PT intervention to improve strength, balance and mobility;    Personal Factors and Comorbidities  Age;Comorbidity 3+;Time since onset of injury/illness/exacerbation    Comorbidities  COPD, ulcerative colitis, osteoporosis, lumbar surgery, L hip replacement    Examination-Activity Limitations  Bend;Stairs;Locomotion Level    Examination-Participation Restrictions  Community Activity;Cleaning    Stability/Clinical Decision Making  Evolving/Moderate complexity    Rehab Potential  Fair    PT Frequency  2x / week    PT Duration  12 weeks    PT Treatment/Interventions  ADLs/Self Care Home Management;Biofeedback;Electrical Stimulation;Moist Heat;Gait training;Stair training;Functional mobility training;Therapeutic activities;Therapeutic exercise;Balance training;Neuromuscular re-education;Manual techniques;Dry needling;Taping;Spinal Manipulations;Joint Manipulations;Aquatic Therapy;Canalith Repostioning;Cryotherapy;Iontophoresis 56m/ml Dexamethasone;Traction;Ultrasound;DME Instruction;Patient/family education;Passive range of motion;Vestibular    PT Next Visit Plan  Initiate HEP, balance and strengthening    PT Home Exercise Plan  Pt encouraged to start sit to stands at home, 2 x 10 BID;    Consulted and Agree with Plan of Care  Patient             Patient will benefit from skilled therapeutic intervention in order to improve the following deficits and impairments:     Visit Diagnosis: Muscle weakness (generalized)  Unsteadiness on feet  Other lack of coordination     Problem List Patient Active Problem List   Diagnosis Date Noted  . Swelling of limb 10/10/2016  . Pain in limb 10/10/2016  . Varicose veins of leg with pain, left 10/10/2016  . Abnormal CT scan of lung 10/29/2015  . Personal history of tobacco use, presenting hazards to health 11/04/2014  . Preop cardiovascular exam 09/11/2011  . SOB (shortness of  breath) 08/21/2011  . Chest pain 08/21/2011  . PVC's (premature ventricular contractions) 08/21/2011    Santina Trillo PT, DPT 08/11/2019, 11:55  AM  [place] MAIN [facility] SERVICES [address] [place], [place], [address] Phone: [phone]   Fax:  [phone]  Name: Sheila Peters MRN: [id number] Date of Birth: 03/12/1941

## 2019-08-13 ENCOUNTER — Ambulatory Visit: Payer: Medicare Other

## 2019-08-13 ENCOUNTER — Other Ambulatory Visit: Payer: Self-pay

## 2019-08-13 ENCOUNTER — Encounter: Payer: Self-pay | Admitting: Physical Therapy

## 2019-08-13 DIAGNOSIS — R293 Abnormal posture: Secondary | ICD-10-CM

## 2019-08-13 DIAGNOSIS — R278 Other lack of coordination: Secondary | ICD-10-CM

## 2019-08-13 DIAGNOSIS — M6281 Muscle weakness (generalized): Secondary | ICD-10-CM

## 2019-08-13 DIAGNOSIS — M533 Sacrococcygeal disorders, not elsewhere classified: Secondary | ICD-10-CM

## 2019-08-13 DIAGNOSIS — M62838 Other muscle spasm: Secondary | ICD-10-CM

## 2019-08-13 DIAGNOSIS — R2681 Unsteadiness on feet: Secondary | ICD-10-CM

## 2019-08-13 NOTE — Therapy (Signed)
[place] MAIN [facility] SERVICES [address] [place], [place], [address] Phone: [phone]   Fax:  [phone]  Physical Therapy Treatment  Patient Details  Name: NERIYAH CERCONE MRN: [id number] Date of Birth: [birth year] Referring Provider (PT): Dr. Manuella Ghazi   Encounter Date: 08/13/2019  PT End of Session - 08/13/19 1151    Visit Number  9    Number of Visits  25    Date for PT Re-Evaluation  09/29/19    Authorization Type  eval 07/07/19    PT Start Time  1145    PT Stop Time  1231    PT Time Calculation (min)  46 min    Equipment Utilized During Treatment  Gait belt    Activity Tolerance  Patient tolerated treatment well    Behavior During Therapy  [facility] for tasks assessed/performed       Past Medical History:  Diagnosis Date  . Arthritis   . Asthma   . Clostridium difficile diarrhea   . Colitis, nonspecific   . COPD (chronic obstructive pulmonary disease) ([place])   . GERD (gastroesophageal reflux disease)   . Hypothyroidism    post-surgical  . Multiple thyroid nodules    post excision  . Nephrolithiasis    spontaneously passed  . Nephrolithiasis   . Non-toxic uninodular goiter   . Osteoporosis, postmenopausal   . Ulcerative colitis, chronic ([place])   . Ulcerative pancolitis without complication [facility])     Past Surgical History:  Procedure Laterality Date  . ABDOMINAL HYSTERECTOMY    . ANKLE SURGERY     left fracture  . BACK SURGERY     herniated disk  . CATARACT EXTRACTION W/PHACO Right 10/23/2018   Procedure: CATARACT EXTRACTION PHACO AND INTRAOCULAR LENS PLACEMENT ([place]) RIGHT;  Surgeon: Leandrew Koyanagi, MD;  Location: [place];  Service: Ophthalmology;  Laterality: Right;  PT WANTS TO BE LAST CASE  . COLONOSCOPY N/A 02/26/2017   Procedure: COLONOSCOPY;  Surgeon: Manya Silvas, MD;  Location: [facility] ENDOSCOPY;  Service: Endoscopy;  Laterality: N/A;  . COLONOSCOPY WITH PROPOFOL N/A 11/02/2014   Procedure:  COLONOSCOPY WITH PROPOFOL;  Surgeon: Josefine Class, MD;  Location: [facility] ENDOSCOPY;  Service: Endoscopy;  Laterality: N/A;  . excision of thyroid nodule    . 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 JOINT REPLACEMENT     2014 lth    There were no vitals filed for this visit.  Subjective Assessment - 08/13/19 1149    Subjective  Patient reported that she was tired after her last PT session, but overall is doing well today. Does have R knee pain.    Pertinent History  Pt reports that she is having intermittent episodes of imbalance that have been occurring over the last year. She reports feeling occasionally lightheaded. She denies any presyncopal symptoms. She reports feeling unsteady approximately once/week. She also complains of intermittent SOB from her COPD. She had a cataract removed in July 2020 and feels like her vision is worse after the procedure. Since the procedure she has been back for an eye exam without notable findings. Pt wears reading glasses but otherwise no corrective lenses. Denies any pain upon arrival to evaluation. 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 She was placed on Vit B12, B3, and magnesium. Her aura has not occurred in  a couple months. She reports a history of low back surgery but doesn't recall what was done. She does know that she has a herniated disc. History of L ankle fracture 2003 with ORIF. She gets intermittent foot numbness which only lasts a short period of time.    Limitations  Walking    Diagnostic tests  02/02/19 MRI: Atrophy and small vessel disease. No acute intracranial findings. 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    Patient Stated Goals  Improve balance     Currently in Pain?  Yes    Pain Score  3     Pain Location  Knee    Pain Orientation  Right    Pain Descriptors / Indicators  --   "pain"   Pain Type  Chronic pain    Pain Onset  More than a month ago        TREATMENT: Warm up on Nustep  BUE/BLE level 2 x4 min (Unbilled);  Pt reports increased shortness of breath, SPO2 91%, HR 72 bpm;    NMR: Instructed patient in balance exercise:   Standing in parallel bars: Standing on airex foam:             -tandem stance unsupported, head turns side/side, up/down x10 alternating which foot was in the front between vertical and horizontal head turns  Tandem stance static standing without UE support 3x30secs with alternating feet in front             -side stepping unsupported on foam balance beam x3 laps with cues to keep head erect to challenge stance control; Forward walking on foam balance beam x3 laps intermittent rail      Standing on 1/2 bolster: Feet apart:             Heel/toe rocks x1.5 min;   Weaving around cones #5 and then stepping over hurdle x4 laps without AD, CGA to close supervision for safety and cues for increased step length and to improve turning for better cone negotiation;   Resisted walking, 12.5# forward/backward, side/side x2 laps fwd/back, and sidestepping 1 lap (pt reported increased R knee pain). (4 way) with min A for safety; Patient had increased difficulty due to LE weakness requiring cues for weight shift for eccentric control;    Pt response/clinical impression: Pt exhibited and reported fatigue with dynamic balance activities, especially challenged by unstable surfaces. Cueing for posture and proper weight shift pt with improved stability noted. The patient would benefit from further skilled PT intervention to continue to address limitations in functional mobility and balance to maximize abilities and safety.     PT Education - 08/13/19 1150    Education Details  balance/strength, HEP    Person(s)  Educated  Patient    Methods  Explanation;Verbal cues    Comprehension  Verbalized understanding;Returned demonstration;Verbal cues required;Tactile cues required;Need further instruction       PT Short Term Goals - 07/07/19 1136      PT SHORT TERM GOAL #1   Title  Pt will be independent with HEP in order to improve strength and balance in order to decrease fall risk and improve function at home.    Time  6    Period  Weeks    Status  New    Target Date  08/18/19        PT Long Term Goals - 07/07/19 1233      PT LONG  TERM GOAL #1   Title  Pt will improve BERG by at least 3 points in order to demonstrate clinically significant improvement in balance.    Baseline  07/07/19: 52/56    Time  12    Period  Weeks    Status  New    Target Date  09/29/19      PT LONG TERM GOAL #2   Title  Pt will improve ABC by at least 13% in order to demonstrate clinically significant improvement in balance confidence.    Baseline  07/07/19: 69.4%    Time  12    Period  Weeks    Status  New    Target Date  09/29/19      PT LONG TERM GOAL #3   Title  Pt will decrease 5TSTS by at least 3 seconds in order to demonstrate clinically significant improvement in LE strength.    Baseline  07/07/19: 16.4s    Time  12    Period  Weeks    Status  New    Target Date  09/29/19      PT LONG TERM GOAL #4   Title  Pt will increase self-selected 10MWT by at least 0.13 m/s in order to demonstrate clinically significant improvement in community ambulation.    Baseline  07/07/19: Self-selected: 12.0s = 0.83 m/s    Time  12    Period  Weeks    Status  New    Target Date  09/29/19            Plan - 08/13/19 1151    Clinical Impression Statement  Pt exhibited and reported fatigue with dynamic balance activities, especially challenged by unstable surfaces. Cueing for posture and proper weight shift pt with improved stability noted. The patient would benefit from further skilled PT intervention to continue to  address limitations in functional mobility and balance to maximize abilities and safety.    Personal Factors and Comorbidities  Age;Comorbidity 3+;Time since onset of injury/illness/exacerbation    Comorbidities  COPD, ulcerative colitis, osteoporosis, lumbar surgery, L hip replacement    Examination-Activity Limitations  Bend;Stairs;Locomotion Level    Examination-Participation Restrictions  Community Activity;Cleaning    Stability/Clinical Decision Making  Evolving/Moderate complexity    Rehab Potential  Fair    Clinical Impairments Affecting Rehab Potential  complex medical history, age, scoliosis, osteoporosis    PT Frequency  2x / week    PT Duration  12 weeks    PT Treatment/Interventions  ADLs/Self Care Home Management;Biofeedback;Electrical Stimulation;Moist Heat;Gait training;Stair training;Functional mobility training;Therapeutic activities;Therapeutic exercise;Balance training;Neuromuscular re-education;Manual techniques;Dry needling;Taping;Spinal Manipulations;Joint Manipulations;Aquatic Therapy;Canalith Repostioning;Cryotherapy;Iontophoresis 64m/ml Dexamethasone;Traction;Ultrasound;DME Instruction;Patient/family education;Passive range of motion;Vestibular    PT Next Visit Plan  Initiate HEP, balance and strengthening    PT Home Exercise Plan  Pt encouraged to start sit to stands at home, 2 x 10 BID;    Consulted and Agree with Plan of Care  Patient       Patient will benefit from skilled therapeutic intervention in order to improve the following deficits and impairments:  Decreased mobility, Decreased endurance, Decreased strength, Decreased balance, Abnormal gait, Decreased activity tolerance, Impaired perceived functional ability  Visit Diagnosis: Muscle weakness (generalized)  Abnormal posture  Unsteadiness on feet  Other lack of coordination  Sacrococcygeal disorders, not elsewhere classified  Other muscle spasm     Problem List Patient Active Problem List    Diagnosis Date Noted  . Swelling of limb 10/10/2016  . Pain in limb 10/10/2016  . Varicose veins  of leg with pain, left 10/10/2016  . Abnormal CT scan of lung 10/29/2015  . Personal history of tobacco use, presenting hazards to health 11/04/2014  . Preop cardiovascular exam 09/11/2011  . SOB (shortness of breath) 08/21/2011  . Chest pain 08/21/2011  . PVC's (premature ventricular contractions) 08/21/2011    Lieutenant Diego PT, DPT 1:58 PM,08/13/19   [place] MAIN [facility] SERVICES [address] [place], [place], [address] Phone: [phone]   Fax:  [phone]  Name: TEONNA COONAN MRN: [id number] Date of Birth: Mar 05, 1941

## 2019-08-18 ENCOUNTER — Encounter: Payer: Self-pay | Admitting: Physical Therapy

## 2019-08-18 ENCOUNTER — Other Ambulatory Visit: Payer: Self-pay

## 2019-08-18 ENCOUNTER — Ambulatory Visit: Payer: Medicare Other | Admitting: Physical Therapy

## 2019-08-18 DIAGNOSIS — R278 Other lack of coordination: Secondary | ICD-10-CM

## 2019-08-18 DIAGNOSIS — R293 Abnormal posture: Secondary | ICD-10-CM

## 2019-08-18 DIAGNOSIS — R2681 Unsteadiness on feet: Secondary | ICD-10-CM

## 2019-08-18 DIAGNOSIS — M6281 Muscle weakness (generalized): Secondary | ICD-10-CM

## 2019-08-18 NOTE — Patient Instructions (Signed)
FUNCTIONAL OUTCOME MEASURES     Results 3/15  Results 4/26 Comments  BERG 52/56  53/56 25% risk for falls  TUG 11.4 seconds  WNL  5TSTS 16.4 seconds  14.7 sec without HHA <15 sec indicates low fall risk  10 Meter Gait Speed Self-selected: 12.0s = 0.83 m/s; Fastest: 11.1s = 0.21ms Self selected: 11.9 sec (0.84 m/s) Fastest: 10 sec (1.0 m/s) Below normative values for full community ambulation  FOTO score 45/100  62/100   ABC Scale 69.4%  48.8% Above cut-off

## 2019-08-18 NOTE — Therapy (Signed)
[place] MAIN [facility] SERVICES [address] [place], [place], [address] Phone: [phone]   Fax:  [phone]  Physical Therapy Treatment Physical Therapy Progress Note   Dates of reporting period 07/07/19 to   08/18/19   Patient Details  Name: Sheila Peters MRN: [id number] Date of Birth: 04-27-1940 Referring Provider (PT): Dr. Manuella Ghazi   Encounter Date: 08/18/2019  PT End of Session - 08/18/19 1152    Visit Number  10    Number of Visits  25    Date for PT Re-Evaluation  09/29/19    Authorization Type  eval 07/07/19    PT Start Time  1147    PT Stop Time  1230    PT Time Calculation (min)  43 min    Equipment Utilized During Treatment  Gait belt    Activity Tolerance  Patient tolerated treatment well    Behavior During Therapy  [facility] for tasks assessed/performed       Past Medical History:  Diagnosis Date  . Arthritis   . Asthma   . Clostridium difficile diarrhea   . Colitis, nonspecific   . COPD (chronic obstructive pulmonary disease) ([place])   . GERD (gastroesophageal reflux disease)   . Hypothyroidism    post-surgical  . Multiple thyroid nodules    post excision  . Nephrolithiasis    spontaneously passed  . Nephrolithiasis   . Non-toxic uninodular goiter   . Osteoporosis, postmenopausal   . Ulcerative colitis, chronic ([place])   . Ulcerative pancolitis without complication [facility])     Past Surgical History:  Procedure Laterality Date  . ABDOMINAL HYSTERECTOMY    . ANKLE SURGERY     left fracture  . BACK SURGERY     herniated disk  . CATARACT EXTRACTION W/PHACO Right 10/23/2018   Procedure: CATARACT EXTRACTION PHACO AND INTRAOCULAR LENS PLACEMENT ([place]) RIGHT;  Surgeon: Leandrew Koyanagi, MD;  Location: [place];  Service: Ophthalmology;  Laterality: Right;  PT WANTS TO BE LAST CASE  . COLONOSCOPY N/A 02/26/2017   Procedure: COLONOSCOPY;  Surgeon: Manya Silvas, MD;  Location: [facility] ENDOSCOPY;  Service:  Endoscopy;  Laterality: N/A;  . COLONOSCOPY WITH PROPOFOL N/A 11/02/2014   Procedure: COLONOSCOPY WITH PROPOFOL;  Surgeon: Josefine Class, MD;  Location: [facility] ENDOSCOPY;  Service: Endoscopy;  Laterality: N/A;  . excision of thyroid nodule    . 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 JOINT REPLACEMENT     2014 lth    There were no vitals filed for this visit.  Subjective Assessment - 08/18/19 1151    Subjective  Patient reports having vision changes associated with migraine this morning, but states it has passed. She is still having shortness of breath. She denies any new falls;    Pertinent History  Pt reports that she is having intermittent episodes of imbalance that have been occurring over the last year. She reports feeling occasionally lightheaded. She denies any presyncopal symptoms. She reports feeling unsteady approximately once/week. She also complains of intermittent SOB from her COPD. She had a cataract removed in July 2020 and feels like her vision is worse after the procedure. Since the procedure she has been back for an eye exam without notable findings. Pt wears reading glasses but otherwise no corrective lenses. Denies any pain upon arrival to evaluation. 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 She was placed on Vit B12, B3, and magnesium. Her aura has not occurred in a couple months. She reports a history of low back surgery but doesn't recall what was done. She does know that she has a herniated disc. History of L ankle fracture 2003 with ORIF. She gets intermittent foot numbness which only lasts a short period of time.    Limitations  Walking    Diagnostic tests  02/02/19 MRI: Atrophy and small vessel disease. No acute intracranial findings. Prominent perivascular spaces throughout the basal  ganglia and dolichoectasia of the intracranial vasculature, likely sequelae of chronic hypertension.    Patient Stated Goals  Improve balance    Currently in Pain?  No/denies    Pain Onset  More than a month ago    Multiple Pain Sites  No         [facility] PT Assessment - 08/18/19 0001      Berg Balance Test   Sit to Stand  Able to stand without using hands and stabilize independently    Standing Unsupported  Able to stand safely 2 minutes    Sitting with Back Unsupported but Feet Supported on Floor or Stool  Able to sit safely and securely 2 minutes    Stand to Sit  Sits safely with minimal use of hands    Transfers  Able to transfer safely, minor use of hands    Standing Unsupported with Eyes Closed  Able to stand 10 seconds safely    Standing Unsupported with Feet Together  Able to place feet together independently and stand 1 minute safely    From Standing, Reach Forward with Outstretched Arm  Can reach confidently >25 cm (10")    From Standing Position, Pick up Object from Floor  Able to pick up shoe safely and easily    From Standing Position, Turn to Look Behind Over each Shoulder  Looks behind from both sides and weight shifts well    Turn 360 Degrees  Able to turn 360 degrees safely in 4 seconds or less    Standing Unsupported, Alternately Place Feet on Step/Stool  Able to stand independently and safely and complete 8 steps in 20 seconds    Standing Unsupported, One Foot in Front  Able to plae foot ahead of the other independently and hold 30 seconds    Standing on One Leg  Able to lift leg independently and hold equal to or more than 3 seconds    Total Score  53      Functional Gait  Assessment   Gait assessed   Yes    Gait Level Surface  Walks 20 ft in less than 5.5 sec, no assistive devices, good speed, no evidence for imbalance, normal gait pattern, deviates no more than 6 in outside of the 12 in walkway width.    Change in Gait Speed  Able to change speed, demonstrates mild  gait deviations, deviates 6-10 in outside of the 12 in walkway width, or no gait deviations, unable to achieve a major change in velocity, or uses a change in velocity, or uses an assistive device.    Gait with Horizontal Head Turns  Performs head turns smoothly with slight change in gait velocity (eg, minor disruption to smooth gait path), deviates 6-10 in outside 12 in walkway width, or uses an assistive device.    Gait with Vertical Head Turns  Performs task with slight change in gait velocity (eg, minor disruption to  smooth gait path), deviates 6 - 10 in outside 12 in walkway width or uses assistive device    Gait and Pivot Turn  Pivot turns safely in greater than 3 sec and stops with no loss of balance, or pivot turns safely within 3 sec and stops with mild imbalance, requires small steps to catch balance.    Step Over Obstacle  Is able to step over one shoe box (4.5 in total height) but must slow down and adjust steps to clear box safely. May require verbal cueing.    Gait with Narrow Base of Support  Ambulates 4-7 steps.    Gait with Eyes Closed  Walks 20 ft, slow speed, abnormal gait pattern, evidence for imbalance, deviates 10-15 in outside 12 in walkway width. Requires more than 9 sec to ambulate 20 ft.    Ambulating Backwards  Walks 20 ft, slow speed, abnormal gait pattern, evidence for imbalance, deviates 10-15 in outside 12 in walkway width.    Steps  Alternating feet, must use rail.    Total Score  17       TREATMENT: Warm up on Nustep BUE/BLE level 2 x4 min (Unbilled); Instructed patient in outcome measures to address progress towards goals:  FUNCTIONAL OUTCOME MEASURES     Results 3/15  Results 4/26 Comments  BERG 52/56  53/56 25% risk for falls  TUG 11.4 seconds  WNL  5TSTS 16.4 seconds  14.7 sec without HHA <15 sec indicates low fall risk  10 Meter Gait Speed Self-selected: 12.0s = 0.83 m/s; Fastest: 11.1s = 0.55ms Self selected: 11.9 sec (0.84 m/s) Fastest: 10 sec (1.0  m/s) Below normative values for full community ambulation  FOTO score 45/100  62/100   Functional Gait assessment Not Tested 17/30 High fall risk  ABC Scale 69.4%  48.8% Above cut-off                       PT Education - 08/18/19 1152    Education Details  balance/strength, HEP    Person(s) Educated  Patient    Methods  Explanation;Verbal cues    Comprehension  Verbalized understanding;Returned demonstration;Verbal cues required;Need further instruction       PT Short Term Goals - 08/18/19 1155      PT SHORT TERM GOAL #1   Title  Pt will be independent with HEP in order to improve strength and balance in order to decrease fall risk and improve function at home.    Baseline  doing HEP about 2x a week    Time  6    Period  Weeks    Status  Partially Met    Target Date  08/18/19        PT Long Term Goals - 08/18/19 1155      PT LONG TERM GOAL #1   Title  Pt will improve BERG by at least 3 points in order to demonstrate clinically significant improvement in balance.    Baseline  07/07/19: 52/56, 4/26: 53/56    Time  12    Period  Weeks    Status  Partially Met    Target Date  09/29/19      PT LONG TERM GOAL #2   Title  Pt will improve ABC by at least 13% in order to demonstrate clinically significant improvement in balance confidence.    Baseline  07/07/19: 69.4%, 4/26: 48.8%    Time  12    Period  Weeks    Status  Not Met    Target Date  09/29/19      PT LONG TERM GOAL #3   Title  Pt will decrease 5TSTS by at least 3 seconds in order to demonstrate clinically significant improvement in LE strength.    Baseline  07/07/19: 16.4s, 4/26: 14.7 sec    Time  12    Period  Weeks    Status  Partially Met    Target Date  09/29/19      PT LONG TERM GOAL #4   Title  Pt will increase self-selected 10MWT by at least 0.13 m/s in order to demonstrate clinically significant improvement in community ambulation.    Baseline  07/07/19: Self-selected: 12.0s = 0.83 m/s,  4/26: 11.9 sec 0.84 m/s    Time  12    Period  Weeks    Status  Not Met    Target Date  09/29/19      PT LONG TERM GOAL #5   Title  Patient will improve functional gait assessment to >20/30 to indicate lower fall risk with walking.    Time  4    Period  Weeks    Status  New    Target Date  09/29/19      Additional Long Term Goals   Additional Long Term Goals  Yes      PT LONG TERM GOAL #6   Title  Patient will be mod I for transferring floor to standing to improve ability with fall recovery for better safety awareness at home.    Time  4    Period  Weeks    Status  New    Target Date  09/29/19            Plan - 08/18/19 1441    Clinical Impression Statement  Patient motivated and participated well within session. PT instructed patient in outcome measures to address goals. Overall patient does exhibit improvement in balance and gait ability. Although she is still considered a high fall risk. Patient would benefit from skilled PT intervention to improve strength, balance and mobility. Plan to continue with LE strengthening and improving dynamic balance activities.    Personal Factors and Comorbidities  Age;Comorbidity 3+;Time since onset of injury/illness/exacerbation    Comorbidities  COPD, ulcerative colitis, osteoporosis, lumbar surgery, L hip replacement    Examination-Activity Limitations  Bend;Stairs;Locomotion Level    Examination-Participation Restrictions  Community Activity;Cleaning    Stability/Clinical Decision Making  Evolving/Moderate complexity    Rehab Potential  Fair    Clinical Impairments Affecting Rehab Potential  complex medical history, age, scoliosis, osteoporosis    PT Frequency  2x / week    PT Duration  12 weeks    PT Treatment/Interventions  ADLs/Self Care Home Management;Biofeedback;Electrical Stimulation;Moist Heat;Gait training;Stair training;Functional mobility training;Therapeutic activities;Therapeutic exercise;Balance training;Neuromuscular  re-education;Manual techniques;Dry needling;Taping;Spinal Manipulations;Joint Manipulations;Aquatic Therapy;Canalith Repostioning;Cryotherapy;Iontophoresis 75m/ml Dexamethasone;Traction;Ultrasound;DME Instruction;Patient/family education;Passive range of motion;Vestibular    PT Next Visit Plan  Initiate HEP, balance and strengthening    PT Home Exercise Plan  Pt encouraged to start sit to stands at home, 2 x 10 BID;    Consulted and Agree with Plan of Care  Patient       Patient will benefit from skilled therapeutic intervention in order to improve the following deficits and impairments:  Decreased mobility, Decreased endurance, Decreased strength, Decreased balance, Abnormal gait, Decreased activity tolerance, Impaired perceived functional ability  Visit Diagnosis: Muscle weakness (generalized)  Abnormal posture  Unsteadiness on feet  Other lack of coordination  Problem List Patient Active Problem List   Diagnosis Date Noted  . Swelling of limb 10/10/2016  . Pain in limb 10/10/2016  . Varicose veins of leg with pain, left 10/10/2016  . Abnormal CT scan of lung 10/29/2015  . Personal history of tobacco use, presenting hazards to health 11/04/2014  . Preop cardiovascular exam 09/11/2011  . SOB (shortness of breath) 08/21/2011  . Chest pain 08/21/2011  . PVC's (premature ventricular contractions) 08/21/2011    Duran Ohern PT, DPT 08/18/2019, 2:45 PM  [place] MAIN [facility] SERVICES [address] [place], [place], [address] Phone: [phone]   Fax:  [phone]  Name: Sheila Peters MRN: [id number] Date of Birth: 27-Nov-1940

## 2019-08-20 ENCOUNTER — Encounter: Payer: Self-pay | Admitting: Physical Therapy

## 2019-08-20 ENCOUNTER — Other Ambulatory Visit: Payer: Self-pay

## 2019-08-20 ENCOUNTER — Ambulatory Visit: Payer: Medicare Other | Admitting: Physical Therapy

## 2019-08-20 DIAGNOSIS — R293 Abnormal posture: Secondary | ICD-10-CM

## 2019-08-20 DIAGNOSIS — M6281 Muscle weakness (generalized): Secondary | ICD-10-CM

## 2019-08-20 DIAGNOSIS — R2681 Unsteadiness on feet: Secondary | ICD-10-CM

## 2019-08-20 DIAGNOSIS — R278 Other lack of coordination: Secondary | ICD-10-CM

## 2019-08-20 NOTE — Therapy (Signed)
[place] MAIN [facility] SERVICES [address] [place], [place], [address] Phone: [phone]   Fax:  [phone]  Physical Therapy Treatment  Patient Details  Name: Sheila Peters MRN: [id number] Date of Birth: 11/24/1940 Referring Provider (PT): Dr. Manuella Ghazi   Encounter Date: 08/20/2019  PT End of Session - 08/20/19 1151    Visit Number  11    Number of Visits  25    Date for PT Re-Evaluation  09/29/19    Authorization Type  eval 07/07/19    PT Start Time  1146    PT Stop Time  1230    PT Time Calculation (min)  44 min    Equipment Utilized During Treatment  Gait belt    Activity Tolerance  Patient tolerated treatment well    Behavior During Therapy  [facility] for tasks assessed/performed       Past Medical History:  Diagnosis Date  . Arthritis   . Asthma   . Clostridium difficile diarrhea   . Colitis, nonspecific   . COPD (chronic obstructive pulmonary disease) ([place])   . GERD (gastroesophageal reflux disease)   . Hypothyroidism    post-surgical  . Multiple thyroid nodules    post excision  . Nephrolithiasis    spontaneously passed  . Nephrolithiasis   . Non-toxic uninodular goiter   . Osteoporosis, postmenopausal   . Ulcerative colitis, chronic ([place])   . Ulcerative pancolitis without complication [facility])     Past Surgical History:  Procedure Laterality Date  . ABDOMINAL HYSTERECTOMY    . ANKLE SURGERY     left fracture  . BACK SURGERY     herniated disk  . CATARACT EXTRACTION W/PHACO Right 10/23/2018   Procedure: CATARACT EXTRACTION PHACO AND INTRAOCULAR LENS PLACEMENT ([place]) RIGHT;  Surgeon: Leandrew Koyanagi, MD;  Location: [place];  Service: Ophthalmology;  Laterality: Right;  PT WANTS TO BE LAST CASE  . COLONOSCOPY N/A 02/26/2017   Procedure: COLONOSCOPY;  Surgeon: Manya Silvas, MD;  Location: [facility] ENDOSCOPY;  Service: Endoscopy;  Laterality: N/A;  . COLONOSCOPY WITH PROPOFOL N/A 11/02/2014   Procedure:  COLONOSCOPY WITH PROPOFOL;  Surgeon: Josefine Class, MD;  Location: [facility] ENDOSCOPY;  Service: Endoscopy;  Laterality: N/A;  . excision of thyroid nodule    . 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 JOINT REPLACEMENT     2014 lth    There were no vitals filed for this visit.  Subjective Assessment - 08/20/19 1150    Subjective  Patient reports doing well today; Denies any soreness. Reports adherence with HEP    Pertinent History  Pt reports that she is having intermittent episodes of imbalance that have been occurring over the last year. She reports feeling occasionally lightheaded. She denies any presyncopal symptoms. She reports feeling unsteady approximately once/week. She also complains of intermittent SOB from her COPD. She had a cataract removed in July 2020 and feels like her vision is worse after the procedure. Since the procedure she has been back for an eye exam without notable findings. Pt wears reading glasses but otherwise no corrective lenses. Denies any pain upon arrival to evaluation. 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 She was placed on Vit B12, B3, and magnesium. Her aura has not occurred in a couple months. She reports a history of low back  surgery but doesn't recall what was done. She does know that she has a herniated disc. History of L ankle fracture 2003 with ORIF. She gets intermittent foot numbness which only lasts a short period of time.    Limitations  Walking    Diagnostic tests  02/02/19 MRI: Atrophy and small vessel disease. No acute intracranial findings. 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    Patient Stated Goals  Improve balance    Currently in Pain?  No/denies     Pain Onset  More than a month ago    Multiple Pain Sites  No              TREATMENT: Warm up on Nustep BUE/BLE level 2 x4 min (Unbilled);   NMR: Instructed patient in balance exercise:  Standing in parallel bars: Standing on airex beam:  -tandem gait with intermittent rail assist x4 laps with close supervision for safety;             -tandem stance unsupported, head turns side/side #3, x4 reps each foot in front with CGA for safety and cues for gaze stabilization;             -side stepping unsupported x4 laps with cues to keep head erect to challenge stance control;               Weaving around cones #6 x4 laps without AD,  close supervision for safety and cues for increased step length and to improve turning for better cone negotiation;  Side stepping over cones #6 x1 lap each direction with min A for safety and min Vcs to increase step length for better foot clearance;   Resisted walking, 12.5# forward/backward, side/side x2 laps each direction (4way) with CGA for safety; Patient had increased difficulty due to LE weakness requiring cues for weight shift for eccentric control;She required short rest break after 2 directions before continuing due to shortness of breath;   Gait on level surface forward eyes closed x10-15 feet, backward walking eyes open x10-15 feet x3 sets each with CGA  For safety; patient exhibits slight veering to right with forward walking eyes closed due to imbalance;    Response to treatment: Toleratedfair.Patient does require min VCs for proper exercise technique/positioning for optimal muscle activation. She does fatigue requiring short seated rest breaks;Advanced balance exercise, reducing HHA and instructing patient in dynamic balance exercise. Patient does become short of breath with prolonged standing activities. Vitals monitored, SPO2 dropped to low 90's high 80's requiring short seated rest breaks;                       PT Education - 08/20/19 1151    Education Details  balance/strength, HEP    Person(s) Educated  Patient    Methods  Explanation;Verbal cues    Comprehension  Verbalized understanding;Returned demonstration;Verbal cues required;Need further instruction       PT Short Term Goals - 08/18/19 1155      PT SHORT TERM GOAL #1   Title  Pt will be independent with HEP in order to improve strength and balance in order to decrease fall risk and improve function at home.    Baseline  doing HEP about 2x a week    Time  6    Period  Weeks    Status  Partially Met    Target Date  08/18/19  PT Long Term Goals - 08/18/19 1155      PT LONG TERM GOAL #1   Title  Pt will improve BERG by at least 3 points in order to demonstrate clinically significant improvement in balance.    Baseline  07/07/19: 52/56, 4/26: 53/56    Time  12    Period  Weeks    Status  Partially Met    Target Date  09/29/19      PT LONG TERM GOAL #2   Title  Pt will improve ABC by at least 13% in order to demonstrate clinically significant improvement in balance confidence.    Baseline  07/07/19: 69.4%, 4/26: 48.8%    Time  12    Period  Weeks    Status  Not Met    Target Date  09/29/19      PT LONG TERM GOAL #3   Title  Pt will decrease 5TSTS by at least 3 seconds in order to demonstrate clinically significant improvement in LE strength.    Baseline  07/07/19: 16.4s, 4/26: 14.7 sec    Time  12    Period  Weeks    Status  Partially Met    Target Date  09/29/19      PT LONG TERM GOAL #4   Title  Pt will increase self-selected 10MWT by at least 0.13 m/s in order to demonstrate clinically significant improvement in community ambulation.    Baseline  07/07/19: Self-selected: 12.0s = 0.83 m/s, 4/26: 11.9 sec 0.84 m/s    Time  12    Period  Weeks    Status  Not Met    Target Date  09/29/19      PT LONG TERM GOAL #5   Title  Patient will improve functional gait assessment to >20/30  to indicate lower fall risk with walking.    Time  4    Period  Weeks    Status  New    Target Date  09/29/19      Additional Long Term Goals   Additional Long Term Goals  Yes      PT LONG TERM GOAL #6   Title  Patient will be mod I for transferring floor to standing to improve ability with fall recovery for better safety awareness at home.    Time  4    Period  Weeks    Status  New    Target Date  09/29/19            Plan - 08/20/19 1444    Clinical Impression Statement  Patient motivated and participated well within session. She was instructed in advanced balance exercise, focusing on dynamic balance exercise. Patient does fatigue quickly requiring short seated rest breaks. Vitals monitored with SPO2 dropping to low 90's high 80's requiring cues for pursed lip breathing. Patient is progressing well with dynamic balance requiring less assistance, particularly when weaving around cones. She would benefit from additional skilled PT intervention to improve strength, balance and mobility;    Personal Factors and Comorbidities  Age;Comorbidity 3+;Time since onset of injury/illness/exacerbation    Comorbidities  COPD, ulcerative colitis, osteoporosis, lumbar surgery, L hip replacement    Examination-Activity Limitations  Bend;Stairs;Locomotion Level    Examination-Participation Restrictions  Community Activity;Cleaning    Stability/Clinical Decision Making  Evolving/Moderate complexity    Rehab Potential  Fair    Clinical Impairments Affecting Rehab Potential  complex medical history, age, scoliosis, osteoporosis    PT Frequency  2x / week  PT Duration  12 weeks    PT Treatment/Interventions  ADLs/Self Care Home Management;Biofeedback;Electrical Stimulation;Moist Heat;Gait training;Stair training;Functional mobility training;Therapeutic activities;Therapeutic exercise;Balance training;Neuromuscular re-education;Manual techniques;Dry needling;Taping;Spinal Manipulations;Joint  Manipulations;Aquatic Therapy;Canalith Repostioning;Cryotherapy;Iontophoresis 30m/ml Dexamethasone;Traction;Ultrasound;DME Instruction;Patient/family education;Passive range of motion;Vestibular    PT Next Visit Plan  Initiate HEP, balance and strengthening    PT Home Exercise Plan  Pt encouraged to start sit to stands at home, 2 x 10 BID;    Consulted and Agree with Plan of Care  Patient       Patient will benefit from skilled therapeutic intervention in order to improve the following deficits and impairments:  Decreased mobility, Decreased endurance, Decreased strength, Decreased balance, Abnormal gait, Decreased activity tolerance, Impaired perceived functional ability  Visit Diagnosis: Muscle weakness (generalized)  Abnormal posture  Unsteadiness on feet  Other lack of coordination     Problem List Patient Active Problem List   Diagnosis Date Noted  . Swelling of limb 10/10/2016  . Pain in limb 10/10/2016  . Varicose veins of leg with pain, left 10/10/2016  . Abnormal CT scan of lung 10/29/2015  . Personal history of tobacco use, presenting hazards to health 11/04/2014  . Preop cardiovascular exam 09/11/2011  . SOB (shortness of breath) 08/21/2011  . Chest pain 08/21/2011  . PVC's (premature ventricular contractions) 08/21/2011    , PT, DPT 08/20/2019, 2:46 PM  [place] [facility] [address]RF[address] [place] [address]Phone: [phone]  Fax:  [phone] Name: Sheila EICKHOFFMRN: [id number] of Birth: [birth year]

## 2019-08-25 ENCOUNTER — Encounter: Payer: Self-pay | Admitting: Physical Therapy

## 2019-08-25 ENCOUNTER — Ambulatory Visit: Payer: Medicare Other | Attending: Neurology | Admitting: Physical Therapy

## 2019-08-25 ENCOUNTER — Other Ambulatory Visit: Payer: Self-pay

## 2019-08-25 DIAGNOSIS — R293 Abnormal posture: Secondary | ICD-10-CM | POA: Diagnosis present

## 2019-08-25 DIAGNOSIS — R278 Other lack of coordination: Secondary | ICD-10-CM | POA: Diagnosis present

## 2019-08-25 DIAGNOSIS — R2681 Unsteadiness on feet: Secondary | ICD-10-CM | POA: Diagnosis present

## 2019-08-25 DIAGNOSIS — M533 Sacrococcygeal disorders, not elsewhere classified: Secondary | ICD-10-CM | POA: Diagnosis present

## 2019-08-25 DIAGNOSIS — M6281 Muscle weakness (generalized): Secondary | ICD-10-CM | POA: Insufficient documentation

## 2019-08-25 DIAGNOSIS — M62838 Other muscle spasm: Secondary | ICD-10-CM | POA: Insufficient documentation

## 2019-08-25 NOTE — Therapy (Signed)
[place] MAIN [facility] SERVICES [address] [place], [place], [address] Phone: [phone]   Fax:  [phone]  Physical Therapy Treatment  Patient Details  Name: Sheila Peters MRN: [id number] Date of Birth: 10-May-1940 Referring Provider (PT): Dr. Manuella Ghazi   Encounter Date: 08/25/2019  PT End of Session - 08/25/19 1037    Visit Number  12    Number of Visits  25    Date for PT Re-Evaluation  09/29/19    Authorization Type  eval 07/07/19    PT Start Time  1032    PT Stop Time  1115    PT Time Calculation (min)  43 min    Equipment Utilized During Treatment  Gait belt    Activity Tolerance  Patient tolerated treatment well    Behavior During Therapy  [facility] for tasks assessed/performed       Past Medical History:  Diagnosis Date  . Arthritis   . Asthma   . Clostridium difficile diarrhea   . Colitis, nonspecific   . COPD (chronic obstructive pulmonary disease) ([place])   . GERD (gastroesophageal reflux disease)   . Hypothyroidism    post-surgical  . Multiple thyroid nodules    post excision  . Nephrolithiasis    spontaneously passed  . Nephrolithiasis   . Non-toxic uninodular goiter   . Osteoporosis, postmenopausal   . Ulcerative colitis, chronic ([place])   . Ulcerative pancolitis without complication [facility])     Past Surgical History:  Procedure Laterality Date  . ABDOMINAL HYSTERECTOMY    . ANKLE SURGERY     left fracture  . BACK SURGERY     herniated disk  . CATARACT EXTRACTION W/PHACO Right 10/23/2018   Procedure: CATARACT EXTRACTION PHACO AND INTRAOCULAR LENS PLACEMENT ([place]) RIGHT;  Surgeon: Leandrew Koyanagi, MD;  Location: [place];  Service: Ophthalmology;  Laterality: Right;  PT WANTS TO BE LAST CASE  . COLONOSCOPY N/A 02/26/2017   Procedure: COLONOSCOPY;  Surgeon: Manya Silvas, MD;  Location: [facility] ENDOSCOPY;  Service: Endoscopy;  Laterality: N/A;  . COLONOSCOPY WITH PROPOFOL N/A 11/02/2014   Procedure:  COLONOSCOPY WITH PROPOFOL;  Surgeon: Josefine Class, MD;  Location: [facility] ENDOSCOPY;  Service: Endoscopy;  Laterality: N/A;  . excision of thyroid nodule    . 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 JOINT REPLACEMENT     2014 lth    There were no vitals filed for this visit.  Subjective Assessment - 08/25/19 1036    Subjective  Patient reports doing well today; Denies any pain; Reports exercises are not going too well at home, she has a hard time getting motivated to do them all.    Pertinent History  Pt reports that she is having intermittent episodes of imbalance that have been occurring over the last year. She reports feeling occasionally lightheaded. She denies any presyncopal symptoms. She reports feeling unsteady approximately once/week. She also complains of intermittent SOB from her COPD. She had a cataract removed in July 2020 and feels like her vision is worse after the procedure. Since the procedure she has been back for an eye exam without notable findings. Pt wears reading glasses but otherwise no corrective lenses. Denies any pain upon arrival to evaluation. 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 She was placed on Vit B12, B3, and magnesium.  Her aura has not occurred in a couple months. She reports a history of low back surgery but doesn't recall what was done. She does know that she has a herniated disc. History of L ankle fracture 2003 with ORIF. She gets intermittent foot numbness which only lasts a short period of time.    Limitations  Walking    Diagnostic tests  02/02/19 MRI: Atrophy and small vessel disease. No acute intracranial findings. 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     Patient Stated Goals  Improve balance    Currently in Pain?  No/denies    Pain Onset  More than a month ago    Multiple Pain Sites  No          TREATMENT: Warm up on Nustep BUE/BLE level 2 x4 min (Unbilled);   NMR: Instructed patient in balance exercise:   Resisted walking,12.5# forward/backward, side/side x2 laps each direction (4way) with CGA for safety; Patient had increased difficulty due to LE weakness requiring cues for weight shift for eccentric control;She required short rest break after 2 directions before continuing due to shortness of breath;   Gait on level surface: Forward with ball pass side/side x70 feet, supervision Backward with ball pass side/side x50 feet supervision, much slower with slight unsteadiness; Forward with ball toss/catch x70 feet supervision Backward with ball toss/catch x50 feet with better stability and improved gait speed; Side stepping unsupported with ball toss/catch x25 feet x2 laps each direction;    Standing at steps: -alternate toe taps to 4 inch step x5 reps each LE without HHA Progressed to  -forward step ups unsupported x5 reps each LE leading Required close supervision to CGA for safety;   4 square stepping x 3reps clockwise/counterclockwise, unsupported, close supervision;    Response to treatment: Toleratedfair.Patient does require min VCs for proper exercise technique/positioning for optimal muscle activation. She does fatigue requiring short seated rest breaks;Advanced balance exercise, reducing HHA and instructing patient in dynamic balance exercise. Patient does become short of breath with prolonged standing activities. Vitals monitored, SPO2 dropped to low 90's high 80's requiring short seated rest breaks;                       PT Education - 08/25/19 1036    Education Details  balance/strength, HEP    Person(s) Educated  Patient    Methods  Explanation;Verbal cues    Comprehension   Verbalized understanding;Returned demonstration;Verbal cues required;Need further instruction       PT Short Term Goals - 08/18/19 1155      PT SHORT TERM GOAL #1   Title  Pt will be independent with HEP in order to improve strength and balance in order to decrease fall risk and improve function at home.    Baseline  doing HEP about 2x a week    Time  6    Period  Weeks    Status  Partially Met    Target Date  08/18/19        PT Long Term Goals - 08/18/19 1155      PT LONG TERM GOAL #1   Title  Pt will improve BERG by at least 3 points in order to demonstrate clinically significant improvement in balance.    Baseline  07/07/19: 52/56, 4/26: 53/56    Time  12    Period  Weeks    Status  Partially Met  Target Date  09/29/19      PT LONG TERM GOAL #2   Title  Pt will improve ABC by at least 13% in order to demonstrate clinically significant improvement in balance confidence.    Baseline  07/07/19: 69.4%, 4/26: 48.8%    Time  12    Period  Weeks    Status  Not Met    Target Date  09/29/19      PT LONG TERM GOAL #3   Title  Pt will decrease 5TSTS by at least 3 seconds in order to demonstrate clinically significant improvement in LE strength.    Baseline  07/07/19: 16.4s, 4/26: 14.7 sec    Time  12    Period  Weeks    Status  Partially Met    Target Date  09/29/19      PT LONG TERM GOAL #4   Title  Pt will increase self-selected 10MWT by at least 0.13 m/s in order to demonstrate clinically significant improvement in community ambulation.    Baseline  07/07/19: Self-selected: 12.0s = 0.83 m/s, 4/26: 11.9 sec 0.84 m/s    Time  12    Period  Weeks    Status  Not Met    Target Date  09/29/19      PT LONG TERM GOAL #5   Title  Patient will improve functional gait assessment to >20/30 to indicate lower fall risk with walking.    Time  4    Period  Weeks    Status  New    Target Date  09/29/19      Additional Long Term Goals   Additional Long Term Goals  Yes      PT LONG  TERM GOAL #6   Title  Patient will be mod I for transferring floor to standing to improve ability with fall recovery for better safety awareness at home.    Time  4    Period  Weeks    Status  New    Target Date  09/29/19            Plan - 08/25/19 1047    Clinical Impression Statement  Patient motivated and participated fair within session. Instructed patient in advanced balance activities, focusing on dynamic balance exercise. She does become short of breath with prolonged standing/walking requiring short seated rest breaks. Vitals monitored for safety; She is progressing well being mostly close supervision with advanced balance tasks. She would benefit from additional skilled PT intervention to improve strength, balance and gait safety;    Personal Factors and Comorbidities  Age;Comorbidity 3+;Time since onset of injury/illness/exacerbation    Comorbidities  COPD, ulcerative colitis, osteoporosis, lumbar surgery, L hip replacement    Examination-Activity Limitations  Bend;Stairs;Locomotion Level    Examination-Participation Restrictions  Community Activity;Cleaning    Stability/Clinical Decision Making  Evolving/Moderate complexity    Rehab Potential  Fair    Clinical Impairments Affecting Rehab Potential  complex medical history, age, scoliosis, osteoporosis    PT Frequency  2x / week    PT Duration  12 weeks    PT Treatment/Interventions  ADLs/Self Care Home Management;Biofeedback;Electrical Stimulation;Moist Heat;Gait training;Stair training;Functional mobility training;Therapeutic activities;Therapeutic exercise;Balance training;Neuromuscular re-education;Manual techniques;Dry needling;Taping;Spinal Manipulations;Joint Manipulations;Aquatic Therapy;Canalith Repostioning;Cryotherapy;Iontophoresis 71m/ml Dexamethasone;Traction;Ultrasound;DME Instruction;Patient/family education;Passive range of motion;Vestibular    PT Next Visit Plan  Initiate HEP, balance and strengthening    PT Home  Exercise Plan  Pt encouraged to start sit to stands at home, 2 x 10 BID;    Consulted and Agree  with Plan of Care  Patient       Patient will benefit from skilled therapeutic intervention in order to improve the following deficits and impairments:  Decreased mobility, Decreased endurance, Decreased strength, Decreased balance, Abnormal gait, Decreased activity tolerance, Impaired perceived functional ability  Visit Diagnosis: Muscle weakness (generalized)  Abnormal posture  Unsteadiness on feet     Problem List Patient Active Problem List   Diagnosis Date Noted  . Swelling of limb 10/10/2016  . Pain in limb 10/10/2016  . Varicose veins of leg with pain, left 10/10/2016  . Abnormal CT scan of lung 10/29/2015  . Personal history of tobacco use, presenting hazards to health 11/04/2014  . Preop cardiovascular exam 09/11/2011  . SOB (shortness of breath) 08/21/2011  . Chest pain 08/21/2011  . PVC's (premature ventricular contractions) 08/21/2011    Daylee Delahoz PT, DPT 08/25/2019, 10:55 AM  [place] MAIN [facility] SERVICES [address] [place], [place], [address] Phone: [phone]   Fax:  [phone]  Name: ARRINGTON BENCOMO MRN: [id number] Date of Birth: 10/08/1940

## 2019-08-27 ENCOUNTER — Ambulatory Visit: Payer: Medicare Other

## 2019-08-27 ENCOUNTER — Other Ambulatory Visit: Payer: Self-pay

## 2019-08-27 DIAGNOSIS — M6281 Muscle weakness (generalized): Secondary | ICD-10-CM | POA: Diagnosis not present

## 2019-08-27 DIAGNOSIS — R293 Abnormal posture: Secondary | ICD-10-CM

## 2019-08-27 DIAGNOSIS — M62838 Other muscle spasm: Secondary | ICD-10-CM

## 2019-08-27 DIAGNOSIS — R2681 Unsteadiness on feet: Secondary | ICD-10-CM

## 2019-08-27 DIAGNOSIS — M533 Sacrococcygeal disorders, not elsewhere classified: Secondary | ICD-10-CM

## 2019-08-27 DIAGNOSIS — R278 Other lack of coordination: Secondary | ICD-10-CM

## 2019-08-27 NOTE — Therapy (Signed)
[facility] [facility] MAIN REHAB SERVICES [address] [place], [place], [address] Phone: [phone]   Fax:  [phone]  Physical Therapy Treatment  Patient Details  Name: Sheila Peters MRN: [id number] Date of Birth: 08/19/1940 Referring Provider (PT): Dr. Shah   Encounter Date: 08/27/2019  PT End of Session - 08/27/19 1200    Visit Number  13    Number of Visits  25    Date for PT Re-Evaluation  09/29/19    Authorization Type  eval 07/07/19    PT Start Time  1151    PT Stop Time  1230    PT Time Calculation (min)  39 min    Equipment Utilized During Treatment  Gait belt    Activity Tolerance  Patient tolerated treatment well;No increased pain    Behavior During Therapy  WFL for tasks assessed/performed       Past Medical History:  Diagnosis Date  . Arthritis   . Asthma   . Clostridium difficile diarrhea   . Colitis, nonspecific   . COPD (chronic obstructive pulmonary disease) (HCC)   . GERD (gastroesophageal reflux disease)   . Hypothyroidism    post-surgical  . Multiple thyroid nodules    post excision  . Nephrolithiasis    spontaneously passed  . Nephrolithiasis   . Non-toxic uninodular goiter   . Osteoporosis, postmenopausal   . Ulcerative colitis, chronic (HCC)   . 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)     Past Surgical History:  Procedure Laterality Date  . ABDOMINAL HYSTERECTOMY    . ANKLE SURGERY     left fracture  . BACK SURGERY     herniated disk  . CATARACT EXTRACTION W/PHACO Right 10/23/2018   Procedure: CATARACT EXTRACTION PHACO AND INTRAOCULAR LENS PLACEMENT (IOC) RIGHT;  Surgeon: Brasington, Chadwick, MD;  Location: [facility];  Service: Ophthalmology;  Laterality: Right;  PT WANTS TO BE LAST CASE  . COLONOSCOPY N/A 02/26/2017   Procedure: COLONOSCOPY;  Surgeon: Elliott, Robert T, MD;  Location: [facility] ENDOSCOPY;  Service: Endoscopy;  Laterality: N/A;  . COLONOSCOPY WITH PROPOFOL N/A 11/02/2014   Procedure: COLONOSCOPY WITH PROPOFOL;  Surgeon: Matthew Gordon Rein, MD;  Location: [facility] ENDOSCOPY;  Service: Endoscopy;  Laterality: N/A;  . excision of thyroid nodule    . 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 JOINT REPLACEMENT     2014 lth    There were no vitals filed for this visit.  Subjective Assessment - 08/27/19 1158    Subjective  Pt doing ok. Reports conitnued SOB with minimal activity at home. Pain is typical, as per baseline, took a tylenol prior to arrival. HEP has not been performed consistently, as patient is easily dirstracted from other things that need to be done.    Pertinent History  Pt reports that she is having intermittent episodes of imbalance that have been occurring over the last year. She reports feeling occasionally lightheaded. She denies any presyncopal symptoms. She reports feeling unsteady approximately once/week. She also complains of intermittent SOB from her COPD. She had a cataract removed in July 2020 and feels like her vision is worse after the procedure. Since the procedure she has been back for an eye exam without notable findings. Pt wears reading glasses but otherwise no corrective lenses. Denies any pain upon arrival to evaluation. She reports daily headaches and was diagnosed by Dr. 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 She was placed on Vit B12, B3, and magnesium. Her aura has not occurred in a couple months. She reports a history of low back surgery but doesn't recall what was done. She does know that she has a herniated disc. History of L ankle fracture 2003 with ORIF. She gets intermittent foot numbness which only lasts a short period of time.    Currently in Pain?  Yes    Pain Score  2     Pain Location  --   Right knee      INTERVENTION THIS DATE: -Nu-Step BUE/BLE level 2 x4 min, Seat 6, arms 6, Terminal vitals  SpO2: 90%, HR: BPM -Resisted walking, 12.5# forward, side/side x2 laps each direction (4 way) with CGA for safety; DOE is limiting, mandates standing recovery intervals.  -STS from chair, hands free 2x10  -Dual tasking Gait Training: basketball self toss/catch 1x86ft posterior/forward (VERY limited by dyspnea)    PT Short Term Goals - 08/18/19 1155      PT SHORT TERM GOAL #1   Title  Pt will be independent with HEP in order to improve strength and balance in order to decrease fall risk and improve function at home.    Baseline  doing HEP about 2x a week    Time  6    Period  Weeks    Status  Partially Met    Target Date  08/18/19        PT Long Term Goals - 08/18/19 1155      PT LONG TERM GOAL #1   Title  Pt will improve BERG by at least 3 points in order to demonstrate clinically significant improvement in balance.    Baseline  07/07/19: 52/56, 4/26: 53/56    Time  12    Period  Weeks    Status  Partially Met    Target Date  09/29/19      PT LONG TERM GOAL #2   Title  Pt will improve ABC by at least 13% in order to demonstrate clinically significant improvement in balance confidence.    Baseline  07/07/19: 69.4%, 4/26: 48.8%    Time  12    Period  Weeks    Status  Not Met    Target Date  09/29/19      PT LONG TERM GOAL #3   Title  Pt will decrease 5TSTS by at least 3 seconds in order to demonstrate clinically significant improvement in LE strength.    Baseline  07/07/19: 16.4s, 4/26: 14.7 sec    Time  12    Period  Weeks    Status  Partially Met    Target Date  09/29/19      PT LONG TERM GOAL #4   Title  Pt will increase self-selected 10MWT by at least 0.13 m/s in order to demonstrate clinically significant improvement in community ambulation.    Baseline  07/07/19: Self-selected: 12.0s = 0.83 m/s, 4/26: 11.9 sec 0.84 m/s    Time  12    Period  Weeks    Status  Not Met    Target Date  09/29/19      PT LONG TERM GOAL #5   Title  Patient will improve functional gait  assessment to >20/30 to indicate lower fall risk with walking.    Time  4    Period  Weeks    Status  New    Target Date  09/29/19        Additional Long Term Goals   Additional Long Term Goals  Yes      PT LONG TERM GOAL #6   Title  Patient will be mod I for transferring floor to standing to improve ability with fall recovery for better safety awareness at home.    Time  4    Period  Weeks    Status  New    Target Date  09/29/19            Plan - 08/27/19 1210    Clinical Impression Statement Still quite limited by DOE, post exercise vitals with SpO2 at 90-91%. Pt has PRN O2 at home, unclear if she would benefit from having O2 in session to minimize dyspnea as an activity limitor. DOE is significantly more limiting this date compared to even just 2 days ago, however pt remains reluctant to schedule with her PCP or pulmonologist despite encouragement from author. Continued with current plan of care, gently progressing patient's program aimed at address deficits and limitations identified in evaluation. Pt continues to make steady progress toward treatment goals in general. Author provides extensive verbal, visual, and tactile cues when needed to assure all interventions are performed with desired form and good accuracy.   Personal Factors and Comorbidities  Age;Comorbidity 3+;Time since onset of injury/illness/exacerbation    Comorbidities  COPD, ulcerative colitis, osteoporosis, lumbar surgery, L hip replacement    Examination-Activity Limitations  Bend;Stairs;Locomotion Level    Examination-Participation Restrictions  Community Activity;Cleaning    Stability/Clinical Decision Making  Evolving/Moderate complexity    Clinical Decision Making  Moderate    Rehab Potential  Fair    Clinical Impairments Affecting Rehab Potential  complex medical history, age, scoliosis, osteoporosis    PT Frequency  2x / week    PT Duration  12 weeks    PT Treatment/Interventions  ADLs/Self Care Home  Management;Biofeedback;Electrical Stimulation;Moist Heat;Gait training;Stair training;Functional mobility training;Therapeutic activities;Therapeutic exercise;Balance training;Neuromuscular re-education;Manual techniques;Dry needling;Taping;Spinal Manipulations;Joint Manipulations;Aquatic Therapy;Canalith Repostioning;Cryotherapy;Iontophoresis 4mg/ml Dexamethasone;Traction;Ultrasound;DME Instruction;Patient/family education;Passive range of motion;Vestibular    PT Next Visit Plan  Initiate HEP, balance and strengthening    PT Home Exercise Plan  Pt encouraged to start sit to stands at home, 2 x 10 BID;    Consulted and Agree with Plan of Care  Patient       Patient will benefit from skilled therapeutic intervention in order to improve the following deficits and impairments:  Decreased mobility, Decreased endurance, Decreased strength, Decreased balance, Abnormal gait, Decreased activity tolerance, Impaired perceived functional ability  Visit Diagnosis: Muscle weakness (generalized)  Abnormal posture  Unsteadiness on feet  Other lack of coordination  Sacrococcygeal disorders, not elsewhere classified  Other muscle spasm     Problem List Patient Active Problem List   Diagnosis Date Noted  . Swelling of limb 10/10/2016  . Pain in limb 10/10/2016  . Varicose veins of leg with pain, left 10/10/2016  . Abnormal CT scan of lung 10/29/2015  . Personal history of tobacco use, presenting hazards to health 11/04/2014  . Preop cardiovascular exam 09/11/2011  . SOB (shortness of breath) 08/21/2011  . Chest pain 08/21/2011  . PVC's (premature ventricular contractions) 08/21/2011   12:17 PM, 08/27/19  C , PT, DPT Physical Therapist - [place] [facility]  Outpatient Physical Therapy- Main Campus [phone]     , C 08/27/2019, 12:14 PM  [facility] MA[facility] RE[facility] SERVICES [address] [place],  [place], [address] Phone: [phone]   Fax:  [phone]    Name: Sheila Peters MRN: [id number] Date of Birth: [birth year]

## 2019-09-01 ENCOUNTER — Other Ambulatory Visit: Payer: Self-pay

## 2019-09-01 ENCOUNTER — Encounter: Payer: Self-pay | Admitting: Physical Therapy

## 2019-09-01 ENCOUNTER — Ambulatory Visit: Payer: Medicare Other | Admitting: Physical Therapy

## 2019-09-01 DIAGNOSIS — M6281 Muscle weakness (generalized): Secondary | ICD-10-CM

## 2019-09-01 DIAGNOSIS — R278 Other lack of coordination: Secondary | ICD-10-CM

## 2019-09-01 DIAGNOSIS — R2681 Unsteadiness on feet: Secondary | ICD-10-CM

## 2019-09-01 DIAGNOSIS — R293 Abnormal posture: Secondary | ICD-10-CM

## 2019-09-01 NOTE — Therapy (Signed)
[place] MAIN [facility] SERVICES [address] [place], [place], [address] Phone: [phone]   Fax:  [phone]  Physical Therapy Treatment  Patient Details  Name: Sheila Peters MRN: [id number] Date of Birth: 1940-08-23 Referring Provider (PT): Dr. Manuella Ghazi   Encounter Date: 09/01/2019  PT End of Session - 09/01/19 1147    Visit Number  14    Number of Visits  25    Date for PT Re-Evaluation  09/29/19    Authorization Type  eval 07/07/19    PT Start Time  1148    PT Stop Time  1230    PT Time Calculation (min)  42 min    Equipment Utilized During Treatment  Gait belt    Activity Tolerance  Patient tolerated treatment well;No increased pain    Behavior During Therapy  WFL for tasks assessed/performed       Past Medical History:  Diagnosis Date  . Arthritis   . Asthma   . Clostridium difficile diarrhea   . Colitis, nonspecific   . COPD (chronic obstructive pulmonary disease) ([place])   . GERD (gastroesophageal reflux disease)   . Hypothyroidism    post-surgical  . Multiple thyroid nodules    post excision  . Nephrolithiasis    spontaneously passed  . Nephrolithiasis   . Non-toxic uninodular goiter   . Osteoporosis, postmenopausal   . Ulcerative colitis, chronic ([place])   . Ulcerative pancolitis without complication [facility])     Past Surgical History:  Procedure Laterality Date  . ABDOMINAL HYSTERECTOMY    . ANKLE SURGERY     left fracture  . BACK SURGERY     herniated disk  . CATARACT EXTRACTION W/PHACO Right 10/23/2018   Procedure: CATARACT EXTRACTION PHACO AND INTRAOCULAR LENS PLACEMENT ([place]) RIGHT;  Surgeon: Leandrew Koyanagi, MD;  Location: [place];  Service: Ophthalmology;  Laterality: Right;  PT WANTS TO BE LAST CASE  . COLONOSCOPY N/A 02/26/2017   Procedure: COLONOSCOPY;  Surgeon: Manya Silvas, MD;  Location: [facility] ENDOSCOPY;  Service: Endoscopy;  Laterality: N/A;  . COLONOSCOPY WITH PROPOFOL N/A 11/02/2014   Procedure: COLONOSCOPY WITH PROPOFOL;  Surgeon: Josefine Class, MD;  Location: [facility] ENDOSCOPY;  Service: Endoscopy;  Laterality: N/A;  . excision of thyroid nodule    . 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 ENDOSCOPY;  Service: Endoscopy;  Laterality: N/A;  . JOINT REPLACEMENT     2014 lth    There were no vitals filed for this visit.  Subjective Assessment - 09/01/19 1145    Subjective  patient reports going to see her PCP who prescribed prednisone and a BP medication. She reports her BP was elevated last week. She feels a little better but is still having some mild shortness of breath;    Pertinent History  Pt reports that she is having intermittent episodes of imbalance that have been occurring over the last year. She reports feeling occasionally lightheaded. She denies any presyncopal symptoms. She reports feeling unsteady approximately once/week. She also complains of intermittent SOB from her COPD. She had a cataract removed in July 2020 and feels like her vision is worse after the procedure. Since the procedure she has been back for an eye exam without notable findings. Pt wears reading glasses but otherwise no corrective lenses. Denies any pain upon arrival to evaluation. 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  She was placed on Vit B12, B3, and magnesium. Her aura has not occurred in a couple months. She reports a history of low back surgery but doesn't recall what was done. She does know that she has a herniated disc. History of L ankle fracture 2003 with ORIF. She gets intermittent foot numbness which only lasts a short period of time.    Currently in Pain?  No/denies    Multiple Pain Sites  No             TREATMENT: Warm up on Nustep BUE/BLE level 2 x4 min (Unbilled);  BP 140/71, SPO2 92%, HR 69 NMR: Instructed patient in balance  exercise:  Standing in parallel bars: -tandem gait on airex balance beam x6 laps with intermittent HHA -side stepping x4 laps each direction with intermittent HHA to challenge balance control; -standing with feet apart, balloon taps x2 min unsupported with close supervision for safety;  Resisted walking side stepping 12.5# x2 laps each direction with min A for safety and cues for weight shift for better dynamic control;   Gait on level surface: Forward with ball pass side/side x70 feet, supervision Backward with ball pass side/side x50 feet supervision, much slower with slight unsteadiness; Forward with ball toss/catch x70 feet supervision Backward with ball toss/catch x50 feet with better stability and improved gait speed; Side stepping unsupported with ball toss/catch x25 feet x2 laps each direction;    Standing at steps: -alternate toe taps to 4 inch step x10 reps each LE without HHA Progressed to  -forward step ups unsupported x5 reps each LE leading Required close supervision to CGA for safety;   Weaving around cones #6 x4 laps Side stepping over cones #6 x2 laps each direction Standing on firm surface, alternate toe taps to cone x10 reps unsupported with min A for safety;    Response to treatment: Toleratedfair.Patient does require min VCs for proper exercise technique/positioning for optimal muscle activation. She does fatigue requiring short seated rest breaks;Advanced balance exercise, reducing HHA and instructing patient in dynamic balance exercise. Patient does become short of breath with prolonged standing activities. Vitals monitored, SPO2 dropped to low 90's requiring short seated rest breaks;                     PT Education - 09/01/19 1146    Education Details  balance/strength, HEP    Person(s) Educated  Patient    Methods  Explanation;Verbal cues    Comprehension  Verbalized understanding;Returned demonstration;Verbal cues  required;Need further instruction       PT Short Term Goals - 08/18/19 1155      PT SHORT TERM GOAL #1   Title  Pt will be independent with HEP in order to improve strength and balance in order to decrease fall risk and improve function at home.    Baseline  doing HEP about 2x a week    Time  6    Period  Weeks    Status  Partially Met    Target Date  08/18/19        PT Long Term Goals - 08/18/19 1155      PT LONG TERM GOAL #1   Title  Pt will improve BERG by at least 3 points in order to demonstrate clinically significant improvement in balance.    Baseline  07/07/19: 52/56, 4/26: 53/56    Time  12    Period  Weeks    Status  Partially Met    Target Date  09/29/19      PT LONG TERM GOAL #2   Title  Pt will improve ABC by at least 13% in order to demonstrate clinically significant improvement in balance confidence.    Baseline  07/07/19: 69.4%, 4/26: 48.8%    Time  12    Period  Weeks    Status  Not Met    Target Date  09/29/19      PT LONG TERM GOAL #3   Title  Pt will decrease 5TSTS by at least 3 seconds in order to demonstrate clinically significant improvement in LE strength.    Baseline  07/07/19: 16.4s, 4/26: 14.7 sec    Time  12    Period  Weeks    Status  Partially Met    Target Date  09/29/19      PT LONG TERM GOAL #4   Title  Pt will increase self-selected 10MWT by at least 0.13 m/s in order to demonstrate clinically significant improvement in community ambulation.    Baseline  07/07/19: Self-selected: 12.0s = 0.83 m/s, 4/26: 11.9 sec 0.84 m/s    Time  12    Period  Weeks    Status  Not Met    Target Date  09/29/19      PT LONG TERM GOAL #5   Title  Patient will improve functional gait assessment to >20/30 to indicate lower fall risk with walking.    Time  4    Period  Weeks    Status  New    Target Date  09/29/19      Additional Long Term Goals   Additional Long Term Goals  Yes      PT LONG TERM GOAL #6   Title  Patient will be mod I for  transferring floor to standing to improve ability with fall recovery for better safety awareness at home.    Time  4    Period  Weeks    Status  New    Target Date  09/29/19            Plan - 09/01/19 1225    Clinical Impression Statement  Patient motivated and participated well within session. Monitored vitals throughout session with SPO2 being in low 90's. Patient does become short of breath with exertion requiring short seated rest breaks for recovery; Patient instructed in advanced balance exercise challenging static and dynamic balance. She does require CGA to min A for safety. She would benefit from additional skilled PT interventiont o improve strength, balance and mobility;    Personal Factors and Comorbidities  Age;Comorbidity 3+;Time since onset of injury/illness/exacerbation    Comorbidities  COPD, ulcerative colitis, osteoporosis, lumbar surgery, L hip replacement    Examination-Activity Limitations  Bend;Stairs;Locomotion Level    Examination-Participation Restrictions  Community Activity;Cleaning    Stability/Clinical Decision Making  Evolving/Moderate complexity    Rehab Potential  Fair    Clinical Impairments Affecting Rehab Potential  complex medical history, age, scoliosis, osteoporosis    PT Frequency  2x / week    PT Duration  12 weeks    PT Treatment/Interventions  ADLs/Self Care Home Management;Biofeedback;Electrical Stimulation;Moist Heat;Gait training;Stair training;Functional mobility training;Therapeutic activities;Therapeutic exercise;Balance training;Neuromuscular re-education;Manual techniques;Dry needling;Taping;Spinal Manipulations;Joint Manipulations;Aquatic Therapy;Canalith Repostioning;Cryotherapy;Iontophoresis 47m/ml Dexamethasone;Traction;Ultrasound;DME Instruction;Patient/family education;Passive range of motion;Vestibular    PT Next Visit Plan  Initiate HEP, balance and strengthening    PT Home Exercise Plan  Pt encouraged to start sit to stands at  home, 2 x 10 BID;    Consulted and Agree  with Plan of Care  Patient       Patient will benefit from skilled therapeutic intervention in order to improve the following deficits and impairments:  Decreased mobility, Decreased endurance, Decreased strength, Decreased balance, Abnormal gait, Decreased activity tolerance, Impaired perceived functional ability  Visit Diagnosis: Muscle weakness (generalized)  Abnormal posture  Unsteadiness on feet  Other lack of coordination     Problem List Patient Active Problem List   Diagnosis Date Noted  . Swelling of limb 10/10/2016  . Pain in limb 10/10/2016  . Varicose veins of leg with pain, left 10/10/2016  . Abnormal CT scan of lung 10/29/2015  . Personal history of tobacco use, presenting hazards to health 11/04/2014  . Preop cardiovascular exam 09/11/2011  . SOB (shortness of breath) 08/21/2011  . Chest pain 08/21/2011  . PVC's (premature ventricular contractions) 08/21/2011    Phil Corti PT, DPT 09/01/2019, 12:27 PM  [place] MAIN [facility] SERVICES [address] [place], [place], [address] Phone: [phone]   Fax:  [phone]  Name: EVELYNA FOLKER MRN: [id number] Date of Birth: Aug 03, 1940

## 2019-09-03 ENCOUNTER — Ambulatory Visit: Payer: Medicare Other | Admitting: Physical Therapy

## 2019-09-08 ENCOUNTER — Ambulatory Visit: Payer: Medicare Other | Admitting: Physical Therapy

## 2019-09-08 ENCOUNTER — Encounter: Payer: Self-pay | Admitting: Physical Therapy

## 2019-09-08 ENCOUNTER — Other Ambulatory Visit: Payer: Self-pay

## 2019-09-08 DIAGNOSIS — R278 Other lack of coordination: Secondary | ICD-10-CM

## 2019-09-08 DIAGNOSIS — M6281 Muscle weakness (generalized): Secondary | ICD-10-CM | POA: Diagnosis not present

## 2019-09-08 DIAGNOSIS — R293 Abnormal posture: Secondary | ICD-10-CM

## 2019-09-08 DIAGNOSIS — R2681 Unsteadiness on feet: Secondary | ICD-10-CM

## 2019-09-08 NOTE — Therapy (Signed)
[place] MAIN [facility] SERVICES [address] [place], [place], [address] Phone: [phone]   Fax:  [phone]  Physical Therapy Treatment  Patient Details  Name: Sheila Peters MRN: [id number] Date of Birth: 1941/04/20 Referring Provider (PT): Dr. Manuella Ghazi   Encounter Date: 09/08/2019  PT End of Session - 09/08/19 1157    Visit Number  15    Number of Visits  25    Date for PT Re-Evaluation  09/29/19    Authorization Type  eval 07/07/19    PT Start Time  1147    PT Stop Time  1230    PT Time Calculation (min)  43 min    Equipment Utilized During Treatment  Gait belt    Activity Tolerance  Patient tolerated treatment well;No increased pain    Behavior During Therapy  WFL for tasks assessed/performed       Past Medical History:  Diagnosis Date  . Arthritis   . Asthma   . Clostridium difficile diarrhea   . Colitis, nonspecific   . COPD (chronic obstructive pulmonary disease) ([place])   . GERD (gastroesophageal reflux disease)   . Hypothyroidism    post-surgical  . Multiple thyroid nodules    post excision  . Nephrolithiasis    spontaneously passed  . Nephrolithiasis   . Non-toxic uninodular goiter   . Osteoporosis, postmenopausal   . Ulcerative colitis, chronic ([place])   . Ulcerative pancolitis without complication [facility])     Past Surgical History:  Procedure Laterality Date  . ABDOMINAL HYSTERECTOMY    . ANKLE SURGERY     left fracture  . BACK SURGERY     herniated disk  . CATARACT EXTRACTION W/PHACO Right 10/23/2018   Procedure: CATARACT EXTRACTION PHACO AND INTRAOCULAR LENS PLACEMENT ([place]) RIGHT;  Surgeon: Leandrew Koyanagi, MD;  Location: [place];  Service: Ophthalmology;  Laterality: Right;  PT WANTS TO BE LAST CASE  . COLONOSCOPY N/A 02/26/2017   Procedure: COLONOSCOPY;  Surgeon: Manya Silvas, MD;  Location: [facility] ENDOSCOPY;  Service: Endoscopy;  Laterality: N/A;  . COLONOSCOPY WITH PROPOFOL N/A 11/02/2014   Procedure: COLONOSCOPY WITH PROPOFOL;  Surgeon: Josefine Class, MD;  Location: [facility] ENDOSCOPY;  Service: Endoscopy;  Laterality: N/A;  . excision of thyroid nodule    . 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 JOINT REPLACEMENT     2014 lth    There were no vitals filed for this visit.  Subjective Assessment - 09/08/19 1155    Subjective  patient reports doing well. She just finished her prednisone pack and states that her breathing is doing better. Denies any falls. Reports that she hasn't been exerting herself.    Pertinent History  Pt reports that she is having intermittent episodes of imbalance that have been occurring over the last year. She reports feeling occasionally lightheaded. She denies any presyncopal symptoms. She reports feeling unsteady approximately once/week. She also complains of intermittent SOB from her COPD. She had a cataract removed in July 2020 and feels like her vision is worse after the procedure. Since the procedure she has been back for an eye exam without notable findings. Pt wears reading glasses but otherwise no corrective lenses. Denies any pain upon arrival to evaluation. 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 She was placed on Vit B12, B3, and  magnesium. Her aura has not occurred in a couple months. She reports a history of low back surgery but doesn't recall what was done. She does know that she has a herniated disc. History of L ankle fracture 2003 with ORIF. She gets intermittent foot numbness which only lasts a short period of time.    Currently in Pain?  No/denies    Multiple Pain Sites  No              TREATMENT: NMR: Instructed patient in balance exercise:  Standing outside parallel bars:  Stepping over orange hurdles #2 forward reciprocally x10 laps with  1-0 rail assist with CGA for safety;  Side stepping over orange hurdle #2 x5 reps each direction;  Standing on 1/2 bolster (flat side up):  -feet apart:  Heel/toe rocking x15 reps with rail assist  Unsupported standing, alternate UE lift x5 reps each UE -tandem stance:   Unsupported standing 10 sec hold x2 reps each foot in front  Unsupported standing head turns side/side x3 reps each foot in front;  Required CGA to min A for safety; Patient exhibits increased ankle instability having difficulty stabilizing 1/2 bolster without rail assist;   Resisted walking side stepping 12.5# x2 laps each direction (4 way) with CGA for safety and cues for weight shift for better dynamic control;   Gait on level surface: Forward with ball pass side/side x150 feet, supervision Forward with ball toss/catch x70 feet supervision Backward with ball toss/catch x50 feet with better stability and improved gait speed; Side stepping unsupported with ball toss/catch x25 feet x2 laps each direction;    Ascend/descend 4 steps with intermittent rail assist x4 sets with supervision for safety;  Patient able to negotiate steps with reciprocal pattern, does have increased difficulty descending steps;   Attempted seated hamstring curl, but patient unable to tolerate due to pressure on ankles;   Response to treatment: Toleratedfair.Patient does require min VCs for proper exercise technique/positioning for optimal muscle activation. She does fatigue requiring short seated rest breaks;Advanced balance exercise, reducing HHA and instructing patient in dynamic balance exercise.                   PT Education - 09/08/19 1156    Education Details  balance/strength, HEP    Person(s) Educated  Patient    Methods  Explanation;Verbal cues    Comprehension  Verbalized understanding;Verbal cues required;Returned demonstration;Need further instruction       PT Short Term Goals - 08/18/19 1155       PT SHORT TERM GOAL #1   Title  Pt will be independent with HEP in order to improve strength and balance in order to decrease fall risk and improve function at home.    Baseline  doing HEP about 2x a week    Time  6    Period  Weeks    Status  Partially Met    Target Date  08/18/19        PT Long Term Goals - 08/18/19 1155      PT LONG TERM GOAL #1   Title  Pt will improve BERG by at least 3 points in order to demonstrate clinically significant improvement in balance.    Baseline  07/07/19: 52/56, 4/26: 53/56    Time  12    Period  Weeks    Status  Partially Met    Target Date  09/29/19      PT LONG TERM GOAL #2   Title  Pt  will improve ABC by at least 13% in order to demonstrate clinically significant improvement in balance confidence.    Baseline  07/07/19: 69.4%, 4/26: 48.8%    Time  12    Period  Weeks    Status  Not Met    Target Date  09/29/19      PT LONG TERM GOAL #3   Title  Pt will decrease 5TSTS by at least 3 seconds in order to demonstrate clinically significant improvement in LE strength.    Baseline  07/07/19: 16.4s, 4/26: 14.7 sec    Time  12    Period  Weeks    Status  Partially Met    Target Date  09/29/19      PT LONG TERM GOAL #4   Title  Pt will increase self-selected 10MWT by at least 0.13 m/s in order to demonstrate clinically significant improvement in community ambulation.    Baseline  07/07/19: Self-selected: 12.0s = 0.83 m/s, 4/26: 11.9 sec 0.84 m/s    Time  12    Period  Weeks    Status  Not Met    Target Date  09/29/19      PT LONG TERM GOAL #5   Title  Patient will improve functional gait assessment to >20/30 to indicate lower fall risk with walking.    Time  4    Period  Weeks    Status  New    Target Date  09/29/19      Additional Long Term Goals   Additional Long Term Goals  Yes      PT LONG TERM GOAL #6   Title  Patient will be mod I for transferring floor to standing to improve ability with fall recovery for better safety awareness  at home.    Time  4    Period  Weeks    Status  New    Target Date  09/29/19            Plan - 09/08/19 1251    Clinical Impression Statement  Patient motivated and participated well within session. Instructed patient in advanced balance tasks. She is progressing well being able to negotiate obstacles with less rail assist. Patient does have difficulty with standing on compliant surfaces. Attempted LE strengthening but patient unable to tolerate band pressure against ankle. She would benefit from additional skilled PT intervention to improve strength, balance and mobility;    Personal Factors and Comorbidities  Age;Comorbidity 3+;Time since onset of injury/illness/exacerbation    Comorbidities  COPD, ulcerative colitis, osteoporosis, lumbar surgery, L hip replacement    Examination-Activity Limitations  Bend;Stairs;Locomotion Level    Examination-Participation Restrictions  Community Activity;Cleaning    Stability/Clinical Decision Making  Evolving/Moderate complexity    Rehab Potential  Fair    Clinical Impairments Affecting Rehab Potential  complex medical history, age, scoliosis, osteoporosis    PT Frequency  2x / week    PT Duration  12 weeks    PT Treatment/Interventions  ADLs/Self Care Home Management;Biofeedback;Electrical Stimulation;Moist Heat;Gait training;Stair training;Functional mobility training;Therapeutic activities;Therapeutic exercise;Balance training;Neuromuscular re-education;Manual techniques;Dry needling;Taping;Spinal Manipulations;Joint Manipulations;Aquatic Therapy;Canalith Repostioning;Cryotherapy;Iontophoresis 81m/ml Dexamethasone;Traction;Ultrasound;DME Instruction;Patient/family education;Passive range of motion;Vestibular    PT Next Visit Plan  Initiate HEP, balance and strengthening    PT Home Exercise Plan  Pt encouraged to start sit to stands at home, 2 x 10 BID;    Consulted and Agree with Plan of Care  Patient       Patient will benefit from skilled  therapeutic intervention in order  to improve the following deficits and impairments:  Decreased mobility, Decreased endurance, Decreased strength, Decreased balance, Abnormal gait, Decreased activity tolerance, Impaired perceived functional ability  Visit Diagnosis: Muscle weakness (generalized)  Abnormal posture  Unsteadiness on feet  Other lack of coordination     Problem List Patient Active Problem List   Diagnosis Date Noted  . Swelling of limb 10/10/2016  . Pain in limb 10/10/2016  . Varicose veins of leg with pain, left 10/10/2016  . Abnormal CT scan of lung 10/29/2015  . Personal history of tobacco use, presenting hazards to health 11/04/2014  . Preop cardiovascular exam 09/11/2011  . SOB (shortness of breath) 08/21/2011  . Chest pain 08/21/2011  . PVC's (premature ventricular contractions) 08/21/2011    Odette Watanabe PT, DPT 09/08/2019, 12:52 PM  [place] MAIN [facility] SERVICES [address] [place], [place], [address] Phone: [phone]   Fax:  [phone]  Name: HENA EWALT MRN: [id number] Date of Birth: March 01, 1941

## 2019-09-09 ENCOUNTER — Telehealth: Payer: Self-pay

## 2019-09-09 NOTE — Telephone Encounter (Signed)
Patient has been notified that the low dose lung cancer screening CT scan is due currently or will be in near future.  Confirmed that patient is within the appropriate age range and asymptomatic, (no signs or symptoms of lung cancer).  Patient denies illness that would prevent curative treatment for lung cancer if found.  Patient is agreeable for CT scan being scheduled.   Verified smoking history (former smoker, quit 02/2014 with 50 year 0.5 ppd history).   CT scan is scheduled for 09/30/19 @ 11:00

## 2019-09-10 ENCOUNTER — Encounter: Payer: Self-pay | Admitting: Physical Therapy

## 2019-09-10 ENCOUNTER — Ambulatory Visit: Payer: Medicare Other | Admitting: Physical Therapy

## 2019-09-10 ENCOUNTER — Telehealth: Payer: Self-pay | Admitting: *Deleted

## 2019-09-10 ENCOUNTER — Other Ambulatory Visit: Payer: Self-pay

## 2019-09-10 DIAGNOSIS — M6281 Muscle weakness (generalized): Secondary | ICD-10-CM | POA: Diagnosis not present

## 2019-09-10 DIAGNOSIS — Z87891 Personal history of nicotine dependence: Secondary | ICD-10-CM

## 2019-09-10 DIAGNOSIS — Z122 Encounter for screening for malignant neoplasm of respiratory organs: Secondary | ICD-10-CM

## 2019-09-10 DIAGNOSIS — R2681 Unsteadiness on feet: Secondary | ICD-10-CM

## 2019-09-10 DIAGNOSIS — R278 Other lack of coordination: Secondary | ICD-10-CM

## 2019-09-10 DIAGNOSIS — R293 Abnormal posture: Secondary | ICD-10-CM

## 2019-09-10 NOTE — Therapy (Signed)
[place] MAIN [facility] SERVICES [address] [place], [place], [address] Phone: [phone]   Fax:  [phone]  Physical Therapy Treatment  Patient Details  Name: Sheila Peters MRN: [id number] Date of Birth: 1940-07-11 Referring Provider (PT): Dr. Manuella Ghazi   Encounter Date: 09/10/2019  PT End of Session - 09/10/19 1152    Visit Number  16    Number of Visits  25    Date for PT Re-Evaluation  09/29/19    Authorization Type  eval 07/07/19    PT Start Time  1147    PT Stop Time  1230    PT Time Calculation (min)  43 min    Equipment Utilized During Treatment  Gait belt    Activity Tolerance  Patient tolerated treatment well;No increased pain    Behavior During Therapy  WFL for tasks assessed/performed       Past Medical History:  Diagnosis Date  . Arthritis   . Asthma   . Clostridium difficile diarrhea   . Colitis, nonspecific   . COPD (chronic obstructive pulmonary disease) ([place])   . GERD (gastroesophageal reflux disease)   . Hypothyroidism    post-surgical  . Multiple thyroid nodules    post excision  . Nephrolithiasis    spontaneously passed  . Nephrolithiasis   . Non-toxic uninodular goiter   . Osteoporosis, postmenopausal   . Ulcerative colitis, chronic ([place])   . Ulcerative pancolitis without complication [facility])     Past Surgical History:  Procedure Laterality Date  . ABDOMINAL HYSTERECTOMY    . ANKLE SURGERY     left fracture  . BACK SURGERY     herniated disk  . CATARACT EXTRACTION W/PHACO Right 10/23/2018   Procedure: CATARACT EXTRACTION PHACO AND INTRAOCULAR LENS PLACEMENT ([place]) RIGHT;  Surgeon: Leandrew Koyanagi, MD;  Location: [place];  Service: Ophthalmology;  Laterality: Right;  PT WANTS TO BE LAST CASE  . COLONOSCOPY N/A 02/26/2017   Procedure: COLONOSCOPY;  Surgeon: Manya Silvas, MD;  Location: [facility] ENDOSCOPY;  Service: Endoscopy;  Laterality: N/A;  . COLONOSCOPY WITH PROPOFOL N/A 11/02/2014   Procedure: COLONOSCOPY WITH PROPOFOL;  Surgeon: Josefine Class, MD;  Location: [facility] ENDOSCOPY;  Service: Endoscopy;  Laterality: N/A;  . excision of thyroid nodule    . 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 JOINT REPLACEMENT     2014 lth    There were no vitals filed for this visit.  Subjective Assessment - 09/10/19 1151    Subjective  Patient reports doing well; no new complaints;    Pertinent History  Pt reports that she is having intermittent episodes of imbalance that have been occurring over the last year. She reports feeling occasionally lightheaded. She denies any presyncopal symptoms. She reports feeling unsteady approximately once/week. She also complains of intermittent SOB from her COPD. She had a cataract removed in July 2020 and feels like her vision is worse after the procedure. Since the procedure she has been back for an eye exam without notable findings. Pt wears reading glasses but otherwise no corrective lenses. Denies any pain upon arrival to evaluation. 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 She was placed on Vit B12, B3, and magnesium. Her aura has not occurred in a couple months. She reports a history of low back surgery but doesn't recall  what was done. She does know that she has a herniated disc. History of L ankle fracture 2003 with ORIF. She gets intermittent foot numbness which only lasts a short period of time.    Currently in Pain?  No/denies    Multiple Pain Sites  No            TREATMENT: Warm up on Nustep BUE/BLE level 2 x4 min (unbilled);  Exercise: Sidelying: Clamshells AROM x15 reps each LE with min VCs for proper positioning including cues to avoid rotating back to back;   Supine: Bridges with arms by side x15 reps;  Patient prone:  Hamstring curl x20 reps  bilaterally; Passive quad stretch 30 sec hold x1 rep each LE Patient reports mild discomfort in low back; She also had increased difficulty tolerating prone position;   Patient required min-moderate verbal/tactile cues for correct exercise technique.  NMR: Instructed patient in balance exercise:  Standing outside parallel bars:  Stepping over orange hurdles #2 forward reciprocally x10 laps with 1-0 rail assist with CGA for safety;  Side stepping over orange hurdle #2 x5 reps each direction;  Standing on 1/2 bolster (flat side up):  -feet apart:             Heel/toe rocking x15 reps with rail assist             Unsupported standing, arms across chest trunk rotation x5 reps each direction;  -tandem stance:              Unsupported standing 10 sec hold x2 reps each foot in front             Unsupported standing head turns side/side x5 reps each foot in front;  Required CGA to min A for safety; Patient exhibits increased ankle instability having difficulty stabilizing 1/2 bolster without rail assist;   Tandem gait on narrow beam x4 laps with 1-0 rail assist, min A for safety; Patient had increased difficulty walking with bars to right side due to heavy lean to right and imbalance;   Resisted walking side stepping 12.5# x2 laps each direction  with CGA for safety and cues for weight shift for better dynamic control;   Response to treatment: Toleratedfair.Patient does require min VCs for proper exercise technique/positioning for optimal muscle activation. She does fatigue requiring short seated rest breaks;Advanced balance exercise, reducing HHA and instructing patient in dynamic balance exercise.She did have increased difficulty with tandem gait with increased instability with narrow base of support;  Patient instructed in advanced LE strengthening exercise. She does require min VCs for proper positioning and exercise technique  Vitals monitored throughout session; Her SPO2 does  continue to drop with prolonged standing requiring short seated rest break; SPo2 in low 90's high 80's with prolonged standing;                       PT Education - 09/10/19 1152    Education Details  balance/strength, HEP    Person(s) Educated  Patient    Methods  Explanation;Verbal cues    Comprehension  Verbalized understanding;Returned demonstration;Verbal cues required;Need further instruction       PT Short Term Goals - 08/18/19 1155      PT SHORT TERM GOAL #1   Title  Pt will be independent with HEP in order to improve strength and balance in order to decrease fall risk and improve function at home.    Baseline  doing HEP about 2x a week  Time  6    Period  Weeks    Status  Partially Met    Target Date  08/18/19        PT Long Term Goals - 08/18/19 1155      PT LONG TERM GOAL #1   Title  Pt will improve BERG by at least 3 points in order to demonstrate clinically significant improvement in balance.    Baseline  07/07/19: 52/56, 4/26: 53/56    Time  12    Period  Weeks    Status  Partially Met    Target Date  09/29/19      PT LONG TERM GOAL #2   Title  Pt will improve ABC by at least 13% in order to demonstrate clinically significant improvement in balance confidence.    Baseline  07/07/19: 69.4%, 4/26: 48.8%    Time  12    Period  Weeks    Status  Not Met    Target Date  09/29/19      PT LONG TERM GOAL #3   Title  Pt will decrease 5TSTS by at least 3 seconds in order to demonstrate clinically significant improvement in LE strength.    Baseline  07/07/19: 16.4s, 4/26: 14.7 sec    Time  12    Period  Weeks    Status  Partially Met    Target Date  09/29/19      PT LONG TERM GOAL #4   Title  Pt will increase self-selected 10MWT by at least 0.13 m/s in order to demonstrate clinically significant improvement in community ambulation.    Baseline  07/07/19: Self-selected: 12.0s = 0.83 m/s, 4/26: 11.9 sec 0.84 m/s    Time  12    Period  Weeks     Status  Not Met    Target Date  09/29/19      PT LONG TERM GOAL #5   Title  Patient will improve functional gait assessment to >20/30 to indicate lower fall risk with walking.    Time  4    Period  Weeks    Status  New    Target Date  09/29/19      Additional Long Term Goals   Additional Long Term Goals  Yes      PT LONG TERM GOAL #6   Title  Patient will be mod I for transferring floor to standing to improve ability with fall recovery for better safety awareness at home.    Time  4    Period  Weeks    Status  New    Target Date  09/29/19            Plan - 09/10/19 1230    Clinical Impression Statement  Patient motivated and participated well within session. She was instructed in advanced LE strengthening exercise. patient does require min VCs for proper positioning. Patient unable to tolerate weight/tband resistance due to discomfort to ankles. Instructed patient in advanced balance exercise,focusing on dynamic balance. She does have difficulty standing on compliant surfaces with decreased ankle stability. She does require min A for most balance exercise. She also exhibits decreased weight shift to LLE due to weakness in LLE. Patient would benefit from additional skilled PT intervention to improve strenght, balance and mobility;    Personal Factors and Comorbidities  Age;Comorbidity 3+;Time since onset of injury/illness/exacerbation    Comorbidities  COPD, ulcerative colitis, osteoporosis, lumbar surgery, L hip replacement    Examination-Activity Limitations  Bend;Stairs;Locomotion Level  Examination-Participation Restrictions  Community Activity;Cleaning    Stability/Clinical Decision Making  Evolving/Moderate complexity    Rehab Potential  Fair    Clinical Impairments Affecting Rehab Potential  complex medical history, age, scoliosis, osteoporosis    PT Frequency  2x / week    PT Duration  12 weeks    PT Treatment/Interventions  ADLs/Self Care Home  Management;Biofeedback;Electrical Stimulation;Moist Heat;Gait training;Stair training;Functional mobility training;Therapeutic activities;Therapeutic exercise;Balance training;Neuromuscular re-education;Manual techniques;Dry needling;Taping;Spinal Manipulations;Joint Manipulations;Aquatic Therapy;Canalith Repostioning;Cryotherapy;Iontophoresis 39m/ml Dexamethasone;Traction;Ultrasound;DME Instruction;Patient/family education;Passive range of motion;Vestibular    PT Next Visit Plan  Initiate HEP, balance and strengthening    PT Home Exercise Plan  Pt encouraged to start sit to stands at home, 2 x 10 BID;    Consulted and Agree with Plan of Care  Patient       Patient will benefit from skilled therapeutic intervention in order to improve the following deficits and impairments:  Decreased mobility, Decreased endurance, Decreased strength, Decreased balance, Abnormal gait, Decreased activity tolerance, Impaired perceived functional ability  Visit Diagnosis: Muscle weakness (generalized)  Abnormal posture  Unsteadiness on feet  Other lack of coordination     Problem List Patient Active Problem List   Diagnosis Date Noted  . Swelling of limb 10/10/2016  . Pain in limb 10/10/2016  . Varicose veins of leg with pain, left 10/10/2016  . Abnormal CT scan of lung 10/29/2015  . Personal history of tobacco use, presenting hazards to health 11/04/2014  . Preop cardiovascular exam 09/11/2011  . SOB (shortness of breath) 08/21/2011  . Chest pain 08/21/2011  . PVC's (premature ventricular contractions) 08/21/2011    Allicia Culley PT, DPT 09/10/2019, 2:58 PM  [place] [facility] [address] [place] [address]Phone: [phone]  Fax:  [phone] Name: ADIAMOND JENTZMRN: [id number] of Birth: [birth year]

## 2019-09-10 NOTE — Telephone Encounter (Signed)
Smoking history: former smoker, quit 03/2014, 46 pack year

## 2019-09-15 ENCOUNTER — Encounter: Payer: Self-pay | Admitting: Physical Therapy

## 2019-09-15 ENCOUNTER — Other Ambulatory Visit: Payer: Self-pay

## 2019-09-15 ENCOUNTER — Ambulatory Visit: Payer: Medicare Other | Admitting: Physical Therapy

## 2019-09-15 DIAGNOSIS — M6281 Muscle weakness (generalized): Secondary | ICD-10-CM | POA: Diagnosis not present

## 2019-09-15 DIAGNOSIS — R293 Abnormal posture: Secondary | ICD-10-CM

## 2019-09-15 DIAGNOSIS — R2681 Unsteadiness on feet: Secondary | ICD-10-CM

## 2019-09-15 NOTE — Therapy (Signed)
[place] [facility] SERVICES [address] [place], [place], [address] Phone: [phone]   Fax:  [phone]  Physical Therapy Treatment/Discharge Summary  Patient Details  Name: Sheila Peters MRN: [id number] Date of Birth: 1940/06/13 Referring Provider (PT): Dr. Manuella Ghazi   Encounter Date: 09/15/2019  PT End of Session - 09/15/19 1152    Visit Number  17    Number of Visits  25    Date for PT Re-Evaluation  09/29/19    Authorization Type  eval 07/07/19    PT Start Time  1146    PT Stop Time  1230    PT Time Calculation (min)  44 min    Equipment Utilized During Treatment  Gait belt    Activity Tolerance  Patient tolerated treatment well;No increased pain    Behavior During Therapy  WFL for tasks assessed/performed       Past Medical History:  Diagnosis Date  . Arthritis   . Asthma   . Clostridium difficile diarrhea   . Colitis, nonspecific   . COPD (chronic obstructive pulmonary disease) ([place])   . GERD (gastroesophageal reflux disease)   . Hypothyroidism    post-surgical  . Multiple thyroid nodules    post excision  . Nephrolithiasis    spontaneously passed  . Nephrolithiasis   . Non-toxic uninodular goiter   . Osteoporosis, postmenopausal   . Ulcerative colitis, chronic ([place])   . Ulcerative pancolitis without complication [facility])     Past Surgical History:  Procedure Laterality Date  . ABDOMINAL HYSTERECTOMY    . ANKLE SURGERY     left fracture  . BACK SURGERY     herniated disk  . CATARACT EXTRACTION W/PHACO Right 10/23/2018   Procedure: CATARACT EXTRACTION PHACO AND INTRAOCULAR LENS PLACEMENT ([place]) RIGHT;  Surgeon: Leandrew Koyanagi, MD;  Location: [place];  Service: Ophthalmology;  Laterality: Right;  PT WANTS TO BE LAST CASE  . COLONOSCOPY N/A 02/26/2017   Procedure: COLONOSCOPY;  Surgeon: Manya Silvas, MD;  Location: [facility] ENDOSCOPY;  Service: Endoscopy;  Laterality: N/A;  . COLONOSCOPY WITH PROPOFOL  N/A 11/02/2014   Procedure: COLONOSCOPY WITH PROPOFOL;  Surgeon: Josefine Class, MD;  Location: [facility] ENDOSCOPY;  Service: Endoscopy;  Laterality: N/A;  . excision of thyroid nodule    . 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 JOINT REPLACEMENT     2014 lth    There were no vitals filed for this visit.  Subjective Assessment - 09/15/19 1151    Subjective  Patient reports doing well; Denies any new falls; She is supposed to see cardiology on Thursday and is supposed to follow up with them regarding shortness of breath;    Pertinent History  Pt reports that she is having intermittent episodes of imbalance that have been occurring over the last year. She reports feeling occasionally lightheaded. She denies any presyncopal symptoms. She reports feeling unsteady approximately once/week. She also complains of intermittent SOB from her COPD. She had a cataract removed in July 2020 and feels like her vision is worse after the procedure. Since the procedure she has been back for an eye exam without notable findings. Pt wears reading glasses but otherwise no corrective lenses. Denies any pain upon arrival to evaluation. 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 She was placed on Vit B12,  B3, and magnesium. Her aura has not occurred in a couple months. She reports a history of low back surgery but doesn't recall what was done. She does know that she has a herniated disc. History of L ankle fracture 2003 with ORIF. She gets intermittent foot numbness which only lasts a short period of time.    Currently in Pain?  No/denies         [facility] PT Assessment - 09/15/19 0001      Functional Gait  Assessment   Gait Level Surface  Walks 20 ft in less than 5.5 sec, no assistive devices, good speed, no evidence for imbalance, normal  gait pattern, deviates no more than 6 in outside of the 12 in walkway width.    Change in Gait Speed  Able to smoothly change walking speed without loss of balance or gait deviation. Deviate no more than 6 in outside of the 12 in walkway width.    Gait with Horizontal Head Turns  Performs head turns smoothly with slight change in gait velocity (eg, minor disruption to smooth gait path), deviates 6-10 in outside 12 in walkway width, or uses an assistive device.    Gait with Vertical Head Turns  Performs task with slight change in gait velocity (eg, minor disruption to smooth gait path), deviates 6 - 10 in outside 12 in walkway width or uses assistive device    Gait and Pivot Turn  Pivot turns safely within 3 sec and stops quickly with no loss of balance.    Step Over Obstacle  Is able to step over one shoe box (4.5 in total height) but must slow down and adjust steps to clear box safely. May require verbal cueing.    Gait with Narrow Base of Support  Ambulates less than 4 steps heel to toe or cannot perform without assistance.    Gait with Eyes Closed  Walks 20 ft, uses assistive device, slower speed, mild gait deviations, deviates 6-10 in outside 12 in walkway width. Ambulates 20 ft in less than 9 sec but greater than 7 sec.    Ambulating Backwards  Walks 20 ft, uses assistive device, slower speed, mild gait deviations, deviates 6-10 in outside 12 in walkway width.    Steps  Alternating feet, must use rail.    Total Score  20      TREATMENT: Warm up on Nustep BUE/BLE level 2 x4 min (Unbilled);  Educated patient in floor transfers: Patient has chronic right knee pain/issues and left hip weakness after THA. She was unable to transition to 1/2 kneeling for standing, but was able to transfer from kneeling to standing pushing through BLE, requiring min A for safety to complete transfer;  Pt instructed in functional gait assessment and other outcome measures, see below:     FUNCTIONAL OUTCOME  MEASURES     Results 3/15  Results 4/26 Results 5/24 Comments  BERG 52/56  53/56  25% risk for falls  TUG 11.4seconds   WNL  5TSTS 16.4seconds  14.7 sec without HHA  13.25 sec <15 sec indicates low fall risk  10 Meter Gait Speed Self-selected:12.0s =0.42ms; Fastest:11.1s =0.922m Self selected: 11.9 sec (0.84 m/s) Fastest: 10 sec (1.0 m/s) Self selected: 11.1 sec (0.9 m/s)  Fastest:  9.5 sec (1.05 m/s) Below normative values for full community ambulation  FOTO score 45/100  62/100  55/100   Functional Gait assessment Not Tested 17/30  20/30 Moderate fall risk  ABC Scale 69.4%  48.8%  61.9% Above cut-off  Patient has met most goals and made progress towards rest of goals. She is concerned about her shortness of breath and would like to stop therapy at this time and follow up with cardiologist.  Vitals monitored during session, SPo2 93% following standing activities;   Patient has HEP and is independent; She admits not being adherent but only 1-2x a week;                 PT Education - 09/15/19 1152    Education Details  balance/strength, HEP    Person(s) Educated  Patient    Methods  Explanation;Verbal cues    Comprehension  Verbalized understanding;Returned demonstration;Verbal cues required;Need further instruction       PT Short Term Goals - 09/15/19 1207      PT SHORT TERM GOAL #1   Title  Pt will be independent with HEP in order to improve strength and balance in order to decrease fall risk and improve function at home.    Baseline  doing HEP about 2x a week    Time  6    Period  Weeks    Status  Partially Met    Target Date  08/18/19        PT Long Term Goals - 09/15/19 1208      PT LONG TERM GOAL #1   Title  Pt will improve BERG by at least 3 points in order to demonstrate clinically significant improvement in balance.    Baseline  07/07/19: 52/56, 4/26: 53/56    Time  12    Period  Weeks    Status  Partially Met    Target Date   09/29/19      PT LONG TERM GOAL #2   Title  Pt will improve ABC by at least 13% in order to demonstrate clinically significant improvement in balance confidence.    Baseline  07/07/19: 69.4%, 4/26: 48.8%, 5/24: 61%    Time  12    Period  Weeks    Status  Not Met    Target Date  09/29/19      PT LONG TERM GOAL #3   Title  Pt will decrease 5TSTS by at least 3 seconds in order to demonstrate clinically significant improvement in LE strength.    Baseline  07/07/19: 16.4s, 4/26: 14.7 sec, 5/24: 13 sec    Time  12    Period  Weeks    Status  Achieved    Target Date  09/29/19      PT LONG TERM GOAL #4   Title  Pt will increase self-selected 10MWT by at least 0.13 m/s in order to demonstrate clinically significant improvement in community ambulation.    Baseline  07/07/19: Self-selected: 12.0s = 0.83 m/s, 4/26: 11.9 sec 0.84 m/s, 5/24: 0.9 m/s    Time  12    Period  Weeks    Status  Achieved    Target Date  09/29/19      PT LONG TERM GOAL #5   Title  Patient will improve functional gait assessment to >20/30 to indicate lower fall risk with walking.    Baseline  5/24: 20/30    Time  4    Period  Weeks    Status  Partially Met    Target Date  09/29/19      PT LONG TERM GOAL #6   Title  Patient will be mod I for transferring floor to standing to improve ability with fall recovery for better safety  awareness at home.    Time  4    Period  Weeks    Status  Partially Met    Target Date  09/29/19            Plan - 09/15/19 1257    Clinical Impression Statement  Patient motivated and participated well within session. She expressed desire to stop therapy at this time as she would like to focus on following up with cardiologist regarding shortness of breath. PT assessed goals. She has made progress towards all goals and has met some goals. Patient is still considered a slight increased risk for falls as evidenced by functional gait assessment. She has a HEP and is independent in  exercise. She is appropriate for discharge from PT at this time.    Personal Factors and Comorbidities  Age;Comorbidity 3+;Time since onset of injury/illness/exacerbation    Comorbidities  COPD, ulcerative colitis, osteoporosis, lumbar surgery, L hip replacement    Examination-Activity Limitations  Bend;Stairs;Locomotion Level    Examination-Participation Restrictions  Community Activity;Cleaning    Stability/Clinical Decision Making  Evolving/Moderate complexity    Rehab Potential  Fair    Clinical Impairments Affecting Rehab Potential  complex medical history, age, scoliosis, osteoporosis    PT Frequency  2x / week    PT Duration  12 weeks    PT Treatment/Interventions  ADLs/Self Care Home Management;Biofeedback;Electrical Stimulation;Moist Heat;Gait training;Stair training;Functional mobility training;Therapeutic activities;Therapeutic exercise;Balance training;Neuromuscular re-education;Manual techniques;Dry needling;Taping;Spinal Manipulations;Joint Manipulations;Aquatic Therapy;Canalith Repostioning;Cryotherapy;Iontophoresis 52m/ml Dexamethasone;Traction;Ultrasound;DME Instruction;Patient/family education;Passive range of motion;Vestibular    PT Next Visit Plan  Initiate HEP, balance and strengthening    PT Home Exercise Plan  Pt encouraged to start sit to stands at home, 2 x 10 BID;    Consulted and Agree with Plan of Care  Patient       Patient will benefit from skilled therapeutic intervention in order to improve the following deficits and impairments:  Decreased mobility, Decreased endurance, Decreased strength, Decreased balance, Abnormal gait, Decreased activity tolerance, Impaired perceived functional ability  Visit Diagnosis: Muscle weakness (generalized)  Abnormal posture  Unsteadiness on feet     Problem List Patient Active Problem List   Diagnosis Date Noted  . Swelling of limb 10/10/2016  . Pain in limb 10/10/2016  . Varicose veins of leg with pain, left 10/10/2016   . Abnormal CT scan of lung 10/29/2015  . Personal history of tobacco use, presenting hazards to health 11/04/2014  . Preop cardiovascular exam 09/11/2011  . SOB (shortness of breath) 08/21/2011  . Chest pain 08/21/2011  . PVC's (premature ventricular contractions) 08/21/2011    Mikael Skoda PT, DPT 09/15/2019, 3:23 PM  [facility] [address] [address]Phone: [phone]  Fax:  [phone] Name: Sheila LEVINSMRN: [id number] of Birth: 2February 09, 1942

## 2019-09-15 NOTE — Patient Instructions (Signed)
FUNCTIONAL OUTCOME MEASURES     Results 3/15  Results 4/26 Results 5/24 Comments  BERG 52/56  53/56  25% risk for falls  TUG 11.4seconds   WNL  5TSTS 16.4seconds  14.7 sec without HHA  13.25 sec <15 sec indicates low fall risk  10 Meter Gait Speed Self-selected:12.0s =0.41ms; Fastest:11.1s =0.958m Self selected: 11.9 sec (0.84 m/s) Fastest: 10 sec (1.0 m/s) Self selected: 11.1 sec (0.9 m/s)  Fastest:  9.5 sec (1.05 m/s) Below normative values for full community ambulation  FOTO score 45/100  62/100  55/100   Functional Gait assessment Not Tested 17/30  20/30 Moderate fall risk  ABC Scale 69.4%  48.8%  61.9% Above cut-off

## 2019-09-17 ENCOUNTER — Ambulatory Visit: Payer: Medicare Other | Admitting: Physical Therapy

## 2019-09-18 ENCOUNTER — Ambulatory Visit (INDEPENDENT_AMBULATORY_CARE_PROVIDER_SITE_OTHER): Payer: Medicare Other | Admitting: Cardiovascular Disease

## 2019-09-18 ENCOUNTER — Other Ambulatory Visit: Payer: Self-pay

## 2019-09-18 ENCOUNTER — Encounter: Payer: Self-pay | Admitting: Cardiovascular Disease

## 2019-09-18 VITALS — BP 144/80 | HR 71 | Ht 62.0 in | Wt 122.5 lb

## 2019-09-18 DIAGNOSIS — E785 Hyperlipidemia, unspecified: Secondary | ICD-10-CM

## 2019-09-18 DIAGNOSIS — I10 Essential (primary) hypertension: Secondary | ICD-10-CM

## 2019-09-18 DIAGNOSIS — I447 Left bundle-branch block, unspecified: Secondary | ICD-10-CM | POA: Diagnosis not present

## 2019-09-18 DIAGNOSIS — R0602 Shortness of breath: Secondary | ICD-10-CM | POA: Diagnosis not present

## 2019-09-18 NOTE — Progress Notes (Signed)
Cardiology Office Note   Date:  09/18/2019   ID:  Sheila, Peters 17-Jun-1940, MRN [id number]  PCP:  Tracie Harrier, MD  Cardiologist:   Kathlyn Sacramento, MD   Chief Complaint  Patient presents with  . office visit    Pt having SOB/ mild swelling in Left leg/ mild dizziness. Meds verbally reviewed w/ pt.      History of Present Illness: Sheila Peters is a 79 y.o. female who was referred by Dr. Ginette Pitman for evaluation of exertional dyspnea.  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  She was evaluated by me in 06-22-2011 for exertional dyspnea, PVCs and atypical chest pain.  She had a Lexiscan Myoview that showed no evidence of ischemia and echocardiogram that showed normal LV systolic function with no significant valvular abnormalities.  She had some difficulty since the death of her husband in June 21, 2014.  Recently, she had significant worsening of exertional dyspnea and she was very elevated at that time with systolic blood pressure 299.  She also has some associated chest tightness.  She was started on Trelegy for COPD and amlodipine for hypertension.  She had improvement in symptoms since then.  She denies orthopnea or PND.  Is noted to have left bundle branch block today which seems to be new.    Past Medical History:  Diagnosis Date  . Arthritis   . Asthma   . Clostridium difficile diarrhea   . Colitis, nonspecific   . COPD (chronic obstructive pulmonary disease) ([place])   . GERD (gastroesophageal reflux disease)   . Hypothyroidism    post-surgical  . Multiple thyroid nodules    post excision  . Nephrolithiasis    spontaneously passed  . Nephrolithiasis   . Non-toxic uninodular goiter   . Osteoporosis, postmenopausal   . Ulcerative colitis, chronic ([place])   . Ulcerative pancolitis without complication [facility])     Past Surgical  History:  Procedure Laterality Date  . ABDOMINAL HYSTERECTOMY    . ANKLE SURGERY     left fracture  . BACK SURGERY     herniated disk  . CATARACT EXTRACTION W/PHACO Right 10/23/2018   Procedure: CATARACT EXTRACTION PHACO AND INTRAOCULAR LENS PLACEMENT ([place]) RIGHT;  Surgeon: Leandrew Koyanagi, MD;  Location: [place];  Service: Ophthalmology;  Laterality: Right;  PT WANTS TO BE LAST CASE  . COLONOSCOPY N/A 02/26/2017   Procedure: COLONOSCOPY;  Surgeon: Manya Silvas, MD;  Location: [facility] ENDOSCOPY;  Service: Endoscopy;  Laterality: N/A;  . COLONOSCOPY WITH PROPOFOL N/A 11/02/2014   Procedure: COLONOSCOPY WITH PROPOFOL;  Surgeon: Josefine Class, MD;  Location[facility] ENDOSCOPY;  Service: Endoscopy;  Laterality: N/A;  . excision of thyroid nodule    . 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 JOINT REPLACEMENT     2012/06/21 lth     Current Outpatient Medications  Medication Sig Dispense Refill  . acetaminophen (TYLENOL) 500 MG tablet Take 500 mg by mouth every 6 (six) hours as needed.    Marland Kitchen amLODipine (NORVASC) 2.5 MG tablet Take 2.5 mg by mouth daily.    . Calcium Citrate-Vitamin D (CITRACAL + D PO) Take by mouth. Taking 1 daily    . cetirizine (ZYRTEC) 10 MG tablet Take 10 mg by mouth as  needed for allergies (before remicade injection).    . Cholecalciferol (D3-1000 PO) Take by mouth. Taking 1 daily    . Cyanocobalamin (VITAMIN B 12 PO) Take by mouth. Taking 1 daily    . famotidine (PEPCID AC MAXIMUM STRENGTH) 20 MG tablet Take 20 mg by mouth daily.    . Fluticasone-Salmeterol (ADVAIR) 250-50 MCG/DOSE AEPB Inhale 1 puff into the lungs 2 (two) times daily.    . Fluticasone-Umeclidin-Vilant (TRELEGY ELLIPTA IN) Inhale into the lungs.    . inFLIXimab (REMICADE) 100 MG injection Inject 100 mg into the vein as directed.    . Multiple Minerals-Vitamins  (CITRACAL PLUS PO) Take by mouth daily.    . Probiotic Product (PROBIOTIC ADVANCED PO) Take 1 capsule by mouth daily.    . psyllium (METAMUCIL) 58.6 % powder Take 1 packet by mouth 3 (three) times daily.    . timolol (TIMOPTIC) 0.5 % ophthalmic solution Place 1 drop into both eyes 2 (two) times daily.    . VENTOLIN HFA 108 (90 Base) MCG/ACT inhaler   4   No current facility-administered medications for this visit.    Allergies:   Adhesive [tape], Augmentin [amoxicillin-pot clavulanate], Codeine, Contrast media [iodinated diagnostic agents], Ibuprofen, Iodine, Levaquin [levofloxacin in d5w], and Morphine and related    Social History:  The patient  reports that she quit smoking about 5 years ago. Her smoking use included cigarettes. She has a 25.00 pack-year smoking history. She has never used smokeless tobacco. She reports current alcohol use. She reports that she does not use drugs.   Family History:  The patient's family history includes Heart disease in her mother.    ROS:  Please see the history of present illness.   Otherwise, review of systems are positive for none.   All other systems are reviewed and negative.    PHYSICAL EXAM: VS:  BP (!) 144/80 (BP Location: Left Arm, Patient Position: Sitting, Cuff Size: Normal)   Pulse 71   Ht 5' 2"  (1.575 m)   Wt 122 lb 8 oz (55.6 kg)   SpO2 98%   BMI 22.41 kg/m  , BMI Body mass index is 22.41 kg/m. GEN: Well nourished, well developed, in no acute distress  HEENT: normal  Neck: no JVD, carotid bruits, or masses Cardiac: RRR; no murmurs, rubs, or gallops,no edema  Respiratory:  clear to auscultation bilaterally, significantly diminished breath sounds bilaterally consistent with COPD GI: soft, nontender, nondistended, + BS MS: no deformity or atrophy  Skin: warm and dry, no rash Neuro:  Strength and sensation are intact Psych: euthymic mood, full affect   EKG:  EKG is ordered today. The ekg ordered today demonstrates normal sinus  rhythm with left bundle branch block.   Recent Labs: No results found for requested labs within last 8760 hours.    Lipid Panel No results found for: CHOL, TRIG, HDL, CHOLHDL, VLDL, LDLCALC, LDLDIRECT    Wt Readings from Last 3 Encounters:  09/18/19 122 lb 8 oz (55.6 kg)  10/23/18 119 lb (54 kg)  09/25/18 118 lb (53.5 kg)     No flowsheet data found.    ASSESSMENT AND PLAN:  1.  Severe exertional dyspnea and chest tightness: Could be due to underlying lung disease.  CT scan last year did show evidence of aortic and coronary atherosclerosis.  We have to exclude underlying ischemic heart disease.  I requested a Lexiscan Myoview.  Her EKG today shows left bundle branch block which seems to be new.  I will obtain  an echocardiogram as well to evaluate diastolic function and pulmonary pressure.  Follow-up with me after testing and if there are any significant abnormalities, she might require a right and left cardiac catheterization.  2.  Essential hypertension: Blood pressure improved with addition of amlodipine.  3.  Left bundle branch block, we have to ensure no underlying cardiomyopathy.  4.  Ulcerative colitis: Currently on Remicade.  5.  Hyperlipidemia: Most recent lipid profile showed an LDL of 135.  Treatment with a statin would be indicated if we confirm significant coronary artery disease.   Disposition:   FU with me in 1 month  Signed,  Kathlyn Sacramento, MD  09/18/2019 2:24 PM    [place]

## 2019-09-18 NOTE — Patient Instructions (Addendum)
Medication Instructions:  Your physician recommends that you continue on your current medications as directed. Please refer to the Current Medication list given to you today.  *If you need a refill on your cardiac medications before your next appointment, please call your pharmacy*   Lab Work: None ordered  If you have labs (blood work) drawn today and your tests are completely normal, you will receive your results only by: Marland Kitchen MyChart Message (if you have MyChart) OR . A paper copy in the mail If you have any lab test that is abnormal or we need to change your treatment, we will call you to review the results.   Testing/Procedures: 1- [place]  Your caregiver has ordered a Stress Test with nuclear imaging. The purpose of this test is to evaluate the blood supply to your heart muscle. This procedure is referred to as a "Non-Invasive Stress Test." This is because other than having an IV started in your vein, nothing is inserted or "invades" your body. Cardiac stress tests are done to find areas of poor blood flow to the heart by determining the extent of coronary artery disease (CAD). Some patients exercise on a treadmill, which naturally increases the blood flow to your heart, while others who are  unable to walk on a treadmill due to physical limitations have a pharmacologic/chemical stress agent called Lexiscan . This medicine will mimic walking on a treadmill by temporarily increasing your coronary blood flow.   Please note: these test may take anywhere between 2-4 hours to complete  PLEASE REPORT TO [facility] AT THE FIRST DESK WILL DIRECT YOU WHERE TO GO  Date of Procedure:_____________________________________  Arrival Time for Procedure:______________________________  Instructions regarding medication:   None to hold  PLEASE NOTIFY THE OFFICE AT LEAST 24 HOURS IN ADVANCE IF YOU ARE UNABLE TO KEEP YOUR APPOINTMENT.  [phone] AND  PLEASE  NOTIFY NUCLEAR MEDICINE AT [facility] AT LEAST 24 HOURS IN ADVANCE IF YOU ARE UNABLE TO KEEP YOUR APPOINTMENT. [phone]  How to prepare for your Myoview test:  1. Do not eat or drink after midnight 2. No caffeine for 24 hours prior to test 3. No smoking 24 hours prior to test. 4. Your medication may be taken with water.  If your doctor stopped a medication because of this test, do not take that medication. 5. Ladies, please do not wear dresses.  Skirts or pants are appropriate. Please wear a short sleeve shirt. 6. No perfume, cologne or lotion. 7. Wear comfortable walking shoes. No heels!   2- Echo  Please return to [facility] on ______________ at _______________ AM/PM for an Echocardiogram. Your physician has requested that you have an echocardiogram. Echocardiography is a painless test that uses sound waves to create images of your heart. It provides your doctor with information about the size and shape of your heart and how well your heart's chambers and valves are working. This procedure takes approximately one hour. There are no restrictions for this procedure. Please note; depending on visual quality an IV may need to be placed.     Follow-Up: At [facility], you and your health needs are our priority.  As part of our continuing mission to provide you with exceptional heart care, we have created designated Provider Care Teams.  These Care Teams include your primary Cardiologist (physician) and Advanced Practice Providers (APPs -  Physician Assistants and Nurse Practitioners) who all work together to provide you with the care you need,  when you need it.  We recommend signing up for the patient portal called "MyChart".  Sign up information is provided on this After Visit Summary.  MyChart is used to connect with patients for Virtual Visits (Telemedicine).  Patients are able to view lab/test results, encounter notes, upcoming appointments, etc.  Non-urgent messages can be  sent to your provider as well.   To learn more about what you can do with MyChart, go to [email or web address].    Your next appointment:   1 month(s)  The format for your next appointment:   In Person  Provider:    You may see Dr. Fletcher Anon or one of the following Advanced Practice Providers on your designated Care Team:    Murray Hodgkins, NP  Christell Faith, PA-C  Marrianne Mood, PA-C

## 2019-09-24 ENCOUNTER — Ambulatory Visit: Payer: Medicare Other | Admitting: Physical Therapy

## 2019-09-29 ENCOUNTER — Ambulatory Visit: Payer: Medicare Other | Admitting: Physical Therapy

## 2019-09-30 ENCOUNTER — Other Ambulatory Visit: Payer: Self-pay

## 2019-09-30 ENCOUNTER — Ambulatory Visit
Admission: RE | Admit: 2019-09-30 | Discharge: 2019-09-30 | Disposition: A | Discharge status: Home / Self Care | Payer: Medicare Other | Source: Ambulatory Visit | Attending: Oncology | Admitting: Oncology

## 2019-09-30 DIAGNOSIS — Z87891 Personal history of nicotine dependence: Secondary | ICD-10-CM | POA: Diagnosis not present

## 2019-09-30 DIAGNOSIS — Z122 Encounter for screening for malignant neoplasm of respiratory organs: Secondary | ICD-10-CM

## 2019-10-01 ENCOUNTER — Ambulatory Visit: Payer: Medicare Other | Admitting: Physical Therapy

## 2019-10-02 ENCOUNTER — Other Ambulatory Visit: Payer: Self-pay

## 2019-10-02 ENCOUNTER — Encounter
Admission: RE | Admit: 2019-10-02 | Discharge: 2019-10-02 | Disposition: A | Discharge status: Home / Self Care | Payer: Medicare Other | Source: Ambulatory Visit | Attending: Cardiovascular Disease | Admitting: Cardiovascular Disease

## 2019-10-02 DIAGNOSIS — R0602 Shortness of breath: Secondary | ICD-10-CM

## 2019-10-02 MED ORDER — REGADENOSON 0.4 MG/5ML IV SOLN
0.4000 mg | Freq: Once | INTRAVENOUS | Status: AC
  Administered 2019-10-02: 0.4 mg via INTRAVENOUS

## 2019-10-02 MED ORDER — TECHNETIUM TC 99M TETROFOSMIN IV KIT
10.0000 | PACK | Freq: Once | INTRAVENOUS | Status: AC | PRN
  Administered 2019-10-02: 10.4 via INTRAVENOUS

## 2019-10-02 MED ORDER — TECHNETIUM TC 99M TETROFOSMIN IV KIT
30.0000 | PACK | Freq: Once | INTRAVENOUS | Status: AC | PRN
  Administered 2019-10-02: 30.704 via INTRAVENOUS

## 2019-10-03 ENCOUNTER — Encounter: Payer: Self-pay | Admitting: *Deleted

## 2019-10-03 LAB — NM MYOCAR MULTI W/SPECT W/WALL MOTION / EF
Estimated workload: 1 METS
Exercise duration (min): 0 min
Exercise duration (sec): 0 s
LV dias vol: 24 mL (ref 46–106)
LV sys vol: 5 mL
MPHR: 143 {beats}/min
Peak HR: 101 {beats}/min
Percent HR: 70 %
Rest HR: 62 {beats}/min
SDS: 0
SRS: 1
SSS: 0
TID: 0.79

## 2019-10-06 ENCOUNTER — Other Ambulatory Visit: Payer: Self-pay | Admitting: Internal Medicine

## 2019-10-06 ENCOUNTER — Telehealth: Payer: Self-pay

## 2019-10-06 ENCOUNTER — Ambulatory Visit: Payer: Medicare Other | Admitting: Physical Therapy

## 2019-10-06 DIAGNOSIS — J449 Chronic obstructive pulmonary disease, unspecified: Secondary | ICD-10-CM

## 2019-10-06 DIAGNOSIS — Z122 Encounter for screening for malignant neoplasm of respiratory organs: Secondary | ICD-10-CM

## 2019-10-06 DIAGNOSIS — R0789 Other chest pain: Secondary | ICD-10-CM

## 2019-10-06 DIAGNOSIS — R0602 Shortness of breath: Secondary | ICD-10-CM

## 2019-10-06 DIAGNOSIS — Z87891 Personal history of nicotine dependence: Secondary | ICD-10-CM

## 2019-10-06 NOTE — Telephone Encounter (Signed)
-----   Message from Wellington Hampshire, MD sent at 10/03/2019  5:25 PM EDT ----- Inform patient that  stress test was normal.

## 2019-10-06 NOTE — Telephone Encounter (Signed)
Call to patient to review stress test results.    Pt verbalized understanding and has no further questions at this time.    Advised pt to call for any further questions or concerns.  No further orders.   

## 2019-10-08 ENCOUNTER — Ambulatory Visit: Payer: Medicare Other | Admitting: Physical Therapy

## 2019-10-13 ENCOUNTER — Ambulatory Visit: Payer: Medicare Other | Admitting: Physical Therapy

## 2019-10-15 ENCOUNTER — Ambulatory Visit: Payer: Medicare Other | Admitting: Physical Therapy

## 2019-10-20 ENCOUNTER — Ambulatory Visit: Payer: Medicare Other | Admitting: Physical Therapy

## 2019-10-22 ENCOUNTER — Ambulatory Visit: Payer: Medicare Other | Admitting: Physical Therapy

## 2019-10-24 ENCOUNTER — Other Ambulatory Visit: Payer: Self-pay

## 2019-10-24 ENCOUNTER — Telehealth: Payer: Self-pay | Admitting: Internal Medicine

## 2019-10-24 ENCOUNTER — Ambulatory Visit (INDEPENDENT_AMBULATORY_CARE_PROVIDER_SITE_OTHER): Payer: Medicare Other

## 2019-10-24 DIAGNOSIS — R0602 Shortness of breath: Secondary | ICD-10-CM | POA: Diagnosis not present

## 2019-10-24 NOTE — Telephone Encounter (Signed)
Called and spoke pt, who stated that she is due for a oxygen recertification by August 1st. Pt has been scheduled for 11/13/2019 at 4:00 with Dr. Patsey Berthold, as this was the only availability, and pt did not wish to drive to [facility] office.  Nothing further is needed at this time.

## 2019-10-27 NOTE — Progress Notes (Signed)
Virtual Visit via Telephone Note   This visit type was conducted due to national recommendations for restrictions regarding the COVID-19 Pandemic (e.g. social distancing) in an effort to limit this patient's exposure and mitigate transmission in our community.  Due to her co-morbid illnesses, this patient is at least at moderate risk for complications without adequate follow up.  This format is felt to be most appropriate for this patient at this time.  The patient did not have access to video technology/had technical difficulties with video requiring transitioning to audio format only (telephone).  All issues noted in this document were discussed and addressed.  No physical exam could be performed with this format.  Please refer to the patient's chart for her  consent to telehealth for [facility].   The patient was identified using 2 identifiers.  Date:  10/30/2019   ID:  Sheila Peters, DOB [id number], MRN [id number]  Patient Location: Home Provider Location: Office  PCP:  Tracie Harrier, MD  Cardiologist:  Kathlyn Sacramento, MD  Electrophysiologist:  None   Evaluation Performed:  Follow-Up Visit  Chief Complaint:  Follow up  History of Present Illness:    Sheila Peters is a 79 y.o. female with ulcerative colitis on Remicade, COPD secondary to prior tobacco use with a quit date in 2015, HTN, HLD, and migraine who presents for follow up after recent echo and [place] MPI.  She was previously evaluated by Dr. Fletcher Anon in 2013 for exertional dyspnea, atypical chest pain, and PVCs. Workup at that time included a Lexiscan MPI that showed no evidence of ischemia and an echo that showed normal LVSF with no significant valvular abnormalities. Sadly, she lost her husband in 2016. More recently, she was re-referred to Dr. Fletcher Anon at the request of her PCP in 08/2019 for evaluation of exertional dyspnea as well as elevated BP in the 417E systolic, and chest tightness. She had recently been  started on Trelegy for COPD by pulmonology and amlodipine for HTN. With these additions, she had noted an improvement in her symptoms. EKG showed a new LBBB. Review of CT chest the year prior did demonstrate aortic and coronary atherosclerosis. She underwent Lexiscan MPI on 10/03/2019 that showed no significant ischemia with CT attenuation corrected images demonstrating mild aortic atherosclerosis and mild coronary artery calcification. This was overall a low risk scan. Echo on 10/24/2019 showed an EF of 60-65%, no RWMA, Gr1DD, RVSF and ventricular cavity size normal, mildly elevated PASP at 33.6 mmHg, and mild AI.   She is seen virtually in follow-up today and is doing very well from a cardiac perspective.  Since starting Trelegy she has noted resolution of her dyspnea.  She denies any chest pain, palpitations, dizziness, presyncope, syncope.  Her granddaughter does check her blood pressure periodically though she does not have any readings available for review.  Last BP check at her neurologist office in late June was reasonably controlled at 131/84.  Patient indicates she does check her BP sometimes it is even lower than this.  She prefers to defer initiating statin therapy at this time and will revisit this with her PCP.  She does not have any issues or concerns at this time.   Labs independently reviewed: 06/2019 - HGB 13.3, PLT 281, potassium 4.2, BUN 17, SCr 0.8, albumin 4.1, AST/ALT normal, TC 225, TG 104, HDL 69, LDL 135, TSH normal  The patient does not have symptoms concerning for COVID-19 infection (fever, chills, cough, or new shortness of breath).  Past Medical History:  Diagnosis Date  . Arthritis   . Asthma   . Clostridium difficile diarrhea   . Colitis, nonspecific   . COPD (chronic obstructive pulmonary disease) ([place])   . GERD (gastroesophageal reflux disease)   . Hypothyroidism    post-surgical  . Multiple thyroid nodules    post excision  . Nephrolithiasis    spontaneously  passed  . Nephrolithiasis   . Non-toxic uninodular goiter   . Osteoporosis, postmenopausal   . Ulcerative colitis, chronic ([place])   . Ulcerative pancolitis without complication [facility])    Past Surgical History:  Procedure Laterality Date  . ABDOMINAL HYSTERECTOMY    . ANKLE SURGERY     left fracture  . BACK SURGERY     herniated disk  . CATARACT EXTRACTION W/PHACO Right 10/23/2018   Procedure: CATARACT EXTRACTION PHACO AND INTRAOCULAR LENS PLACEMENT ([place]) RIGHT;  Surgeon: Leandrew Koyanagi, MD;  Location: [place];  Service: Ophthalmology;  Laterality: Right;  PT WANTS TO BE LAST CASE  . COLONOSCOPY N/A 02/26/2017   Procedure: COLONOSCOPY;  Surgeon: Manya Silvas, MD;  Location: [facility] ENDOSCOPY;  Service: Endoscopy;  Laterality: N/A;  . COLONOSCOPY WITH PROPOFOL N/A 11/02/2014   Procedure: COLONOSCOPY WITH PROPOFOL;  Surgeon: Josefine Class, MD;  Location[facility] ENDOSCOPY;  Service: Endoscopy;  Laterality: N/A;  . excision of thyroid nodule    . 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 JOINT REPLACEMENT     2014 lth     Current Meds  Medication Sig  . acetaminophen (TYLENOL) 500 MG tablet Take 500 mg by mouth every 6 (six) hours as needed.  Marland Kitchen amLODipine (NORVASC) 2.5 MG tablet Take 2.5 mg by mouth daily.  . Calcium Citrate-Vitamin D (CITRACAL + D PO) Take by mouth. Taking 1 daily  . cetirizine (ZYRTEC) 10 MG tablet Take 10 mg by mouth as needed for allergies (before remicade injection).  . Cholecalciferol (D3-1000 PO) Take by mouth. Taking 1 daily  . Cyanocobalamin (VITAMIN B 12 PO) Take by mouth. Taking 1 daily  . famotidine (PEPCID AC MAXIMUM STRENGTH) 20 MG tablet Take 20 mg by mouth daily.  . Fluticasone-Umeclidin-Vilant (TRELEGY ELLIPTA IN) Inhale into the lungs.  . inFLIXimab (REMICADE) 100 MG injection Inject 100 mg into the vein as  directed.  . latanoprost (XALATAN) 0.005 % ophthalmic solution Place 1 drop into both eyes at bedtime.   Marland Kitchen levothyroxine (SYNTHROID) 50 MCG tablet Take 50 mcg by mouth daily.  . psyllium (METAMUCIL) 58.6 % powder Take 1 packet by mouth 3 (three) times daily.  . VENTOLIN HFA 108 (90 Base) MCG/ACT inhaler Inhale 1-2 puffs into the lungs every 4 (four) hours as needed.      Allergies:   Adhesive [tape], Augmentin [amoxicillin-pot clavulanate], Codeine, Contrast media [iodinated diagnostic agents], Ibuprofen, Iodine, Levaquin [levofloxacin in d5w], and Morphine and related   Social History   Tobacco Use  . Smoking status: Former Smoker    Packs/day: 0.50    Years: 50.00    Pack years: 25.00    Types: Cigarettes    Quit date: 03/05/2014    Years since quitting: 5.6  . Smokeless tobacco: Never Used  Vaping Use  . Vaping Use: Never used  Substance Use Topics  . Alcohol use: Yes    Comment: occassionally. none last 18month  . Drug use: No     Family Hx:  The patient's family history includes Heart disease in her mother.  ROS:   Please see the history of present illness.     All other systems reviewed and are negative.   Prior CV studies:   The following studies were reviewed today:  2D echo 10/24/2019: 1. Left ventricular ejection fraction, by estimation, is 60 to 65%. The  left ventricle has normal function. The left ventricle has no regional  wall motion abnormalities. Left ventricular diastolic parameters are  consistent with Grade I diastolic  dysfunction (impaired relaxation).  2. Right ventricular systolic function is normal. The right ventricular  size is normal. There is mildly elevated pulmonary artery systolic  pressure. The estimated right ventricular systolic pressure is 16.1 mmHg.  3. The aortic valve is normal in structure. Aortic valve regurgitation is  mild.  __________  [place] MPI 10/03/2019: Pharmacological myocardial perfusion imaging study with no  significant  ischemia Normal wall motion, EF estimated at 82% No EKG changes concerning for ischemia at peak stress or in recovery. CT attenuation correction images with mild aortic atherosclerosis, mild coronary calcification Low risk scan   Labs/Other Tests and Data Reviewed:    EKG:  An ECG dated 09/18/2019 was personally reviewed today and demonstrated:  NSR, 71 bpm, LBBB  Recent Labs: No results found for requested labs within last 8760 hours.   Recent Lipid Panel No results found for: CHOL, TRIG, HDL, CHOLHDL, LDLCALC, LDLDIRECT  Wt Readings from Last 3 Encounters:  10/30/19 122 lb (55.3 kg)  09/30/19 122 lb (55.3 kg)  09/18/19 122 lb 8 oz (55.6 kg)     Objective:    Vital Signs:  Ht 5' 2"  (1.575 m)   Wt 122 lb (55.3 kg)   BMI 22.31 kg/m    VITAL SIGNS:  reviewed  ASSESSMENT & PLAN:    1. Exertional dyspnea/chest tightness: Symptoms have resolved with initiation of Trelegy.  Echo and Lexiscan MPI unrevealing as outlined above.  No further cardiac testing needed at this time.  2. HTN: Blood pressure improved at recent neurology visit on amlodipine.  No recent home readings for review.  That said, patient does relate her BP has been lower than the 096E systolic.  Follow-up with PCP.  3. LBBB: No symptoms concerning for dizziness, presyncope, syncope.  Echo demonstrated preserved LVSF.  Lexiscan MPI showed no evidence of ischemia.  No further work-up at this time.  4. HLD: LDL 135 from 06/2019 with normal liver function at that time.  She was noted to have mild aortic atherosclerosis and coronary artery calcification on recent CT imaging and prefers to defer initiation of statin therapy at this time.  She will follow up with her PCP regarding this.  COVID-19 Education: The signs and symptoms of COVID-19 were discussed with the patient and how to seek care for testing (follow up with PCP or arrange E-visit).  The importance of social distancing was discussed today.  Time:    Today, I have spent 9 minutes with the patient with telehealth technology discussing the above problems.     Medication Adjustments/Labs and Tests Ordered: Current medicines are reviewed at length with the patient today.  Concerns regarding medicines are outlined above.   Tests Ordered: No orders of the defined types were placed in this encounter.   Medication Changes: No orders of the defined types were placed in this encounter.   Follow Up:  PRN   Signed, Christell Faith, PA-C  10/30/2019 11:39 AM    [facility]  HeartCare

## 2019-10-28 ENCOUNTER — Telehealth: Payer: Self-pay | Admitting: Cardiovascular Disease

## 2019-10-28 ENCOUNTER — Telehealth: Payer: Self-pay

## 2019-10-28 NOTE — Telephone Encounter (Signed)
Patient made aware of echo results with verbalized understanding. Patient has an appt scheduled with Christell Faith, PA on 10/30/19 @ 11:30am.  Patient sts that she is doing well and does not think she need the appt. Patient sts that if Thurmond Butts feels the appt is needed she would like to have telephone visit instead of an in office visit.  Adv the patient that I will fwd the message to [facility].

## 2019-10-28 NOTE — Telephone Encounter (Signed)
  Patient Consent for Virtual Visit         Sheila Peters has provided verbal consent on 10/28/2019 for a virtual visit (video or telephone).   CONSENT FOR VIRTUAL VISIT FOR:  Sheila Peters  By participating in this virtual visit I agree to the following:  I hereby voluntarily request, consent and authorize [facility] and its employed or contracted physicians, physician assistants, nurse practitioners or other licensed health care professionals (the Practitioner), to provide me with telemedicine health care services (the "Services") as deemed necessary by the treating Practitioner. I acknowledge and consent to receive the Services by the Practitioner via telemedicine. I understand that the telemedicine visit will involve communicating with the Practitioner through live audiovisual communication technology and the disclosure of certain medical information by electronic transmission. I acknowledge that I have been given the opportunity to request an in-person assessment or other available alternative prior to the telemedicine visit and am voluntarily participating in the telemedicine visit.  I understand that I have the right to withhold or withdraw my consent to the use of telemedicine in the course of my care at any time, without affecting my right to future care or treatment, and that the Practitioner or I may terminate the telemedicine visit at any time. I understand that I have the right to inspect all information obtained and/or recorded in the course of the telemedicine visit and may receive copies of available information for a reasonable fee.  I understand that some of the potential risks of receiving the Services via telemedicine include:  Marland Kitchen Delay or interruption in medical evaluation due to technological equipment failure or disruption; . Information transmitted may not be sufficient (e.g. poor resolution of images) to allow for appropriate medical decision making by the  Practitioner; and/or  . In rare instances, security protocols could fail, causing a breach of personal health information.  Furthermore, I acknowledge that it is my responsibility to provide information about my medical history, conditions and care that is complete and accurate to the best of my ability. I acknowledge that Practitioner's advice, recommendations, and/or decision may be based on factors not within their control, such as incomplete or inaccurate data provided by me or distortions of diagnostic images or specimens that may result from electronic transmissions. I understand that the practice of medicine is not an exact science and that Practitioner makes no warranties or guarantees regarding treatment outcomes. I acknowledge that a copy of this consent can be made available to me via my patient portal ([place]), or I can request a printed copy by calling the office of [facility].    I understand that my insurance will be billed for this visit.   I have read or had this consent read to me. . I understand the contents of this consent, which adequately explains the benefits and risks of the Services being provided via telemedicine.  . I have been provided ample opportunity to ask questions regarding this consent and the Services and have had my questions answered to my satisfaction. . I give my informed consent for the services to be provided through the use of telemedicine in my medical care

## 2019-10-28 NOTE — Telephone Encounter (Signed)
I am glad she is feeling better.  I would like to assess her symptoms, discuss imaging, with possible recommendations for change in medical therapy, and follow-up on her hypertension.  I am okay with a virtual visit.

## 2019-10-28 NOTE — Telephone Encounter (Signed)
-----   Message from Wellington Hampshire, MD sent at 10/27/2019 10:36 AM EDT ----- Inform patient that echo was fine.  Normal ejection fraction with no significant valvular abnormalities.

## 2019-10-28 NOTE — Telephone Encounter (Signed)
LVM to change to virtual

## 2019-10-30 ENCOUNTER — Other Ambulatory Visit: Payer: Self-pay

## 2019-10-30 ENCOUNTER — Telehealth (INDEPENDENT_AMBULATORY_CARE_PROVIDER_SITE_OTHER): Payer: Medicare Other | Admitting: Physician Assistant

## 2019-10-30 ENCOUNTER — Encounter: Payer: Self-pay | Admitting: Physician Assistant

## 2019-10-30 VITALS — Ht 62.0 in | Wt 122.0 lb

## 2019-10-30 DIAGNOSIS — E785 Hyperlipidemia, unspecified: Secondary | ICD-10-CM | POA: Diagnosis not present

## 2019-10-30 DIAGNOSIS — I10 Essential (primary) hypertension: Secondary | ICD-10-CM | POA: Diagnosis not present

## 2019-10-30 DIAGNOSIS — R0602 Shortness of breath: Secondary | ICD-10-CM

## 2019-10-30 DIAGNOSIS — I447 Left bundle-branch block, unspecified: Secondary | ICD-10-CM

## 2019-10-30 NOTE — Patient Instructions (Signed)
Medication Instructions:  No changes  *If you need a refill on your cardiac medications before your next appointment, please call your pharmacy*   Lab Work: None  If you have labs (blood work) drawn today and your tests are completely normal, you will receive your results only by: Marland Kitchen MyChart Message (if you have MyChart) OR . A paper copy in the mail If you have any lab test that is abnormal or we need to change your treatment, we will call you to review the results.   Testing/Procedures: None   Follow-Up: At [facility], you and your health needs are our priority.  As part of our continuing mission to provide you with exceptional heart care, we have created designated Provider Care Teams.  These Care Teams include your primary Cardiologist (physician) and Advanced Practice Providers (APPs -  Physician Assistants and Nurse Practitioners) who all work together to provide you with the care you need, when you need it.  We recommend signing up for the patient portal called "MyChart".  Sign up information is provided on this After Visit Summary.  MyChart is used to connect with patients for Virtual Visits (Telemedicine).  Patients are able to view lab/test results, encounter notes, upcoming appointments, etc.  Non-urgent messages can be sent to your provider as well.   To learn more about what you can do with MyChart, go to [email or web address].    Your next appointment:   Follow up as needed.

## 2019-11-13 ENCOUNTER — Ambulatory Visit (INDEPENDENT_AMBULATORY_CARE_PROVIDER_SITE_OTHER): Payer: Medicare Other | Admitting: Pulmonary Disease

## 2019-11-13 ENCOUNTER — Encounter: Payer: Self-pay | Admitting: Pulmonary Disease

## 2019-11-13 ENCOUNTER — Other Ambulatory Visit: Payer: Self-pay

## 2019-11-13 VITALS — BP 126/76 | HR 66 | Temp 97.6°F | Ht 62.0 in | Wt 124.8 lb

## 2019-11-13 DIAGNOSIS — Z87891 Personal history of nicotine dependence: Secondary | ICD-10-CM

## 2019-11-13 DIAGNOSIS — J449 Chronic obstructive pulmonary disease, unspecified: Secondary | ICD-10-CM

## 2019-11-13 DIAGNOSIS — J9611 Chronic respiratory failure with hypoxia: Secondary | ICD-10-CM

## 2019-11-13 NOTE — Progress Notes (Signed)
Subjective:    Patient ID: Sheila Peters, female    DOB: October 22, 1940, 79 y.o.   MRN: [id number]  HPI Patient is a 79 year old former smoker (quit 2015) who usually follows with Dr. Patricia Pesa.  She requires requalification for oxygen per her DME guidelines.  Has advanced COPD with FEV1 0.97 L or 49% predicted and FVC at 1.99 L or 75% predicted.  Her FEV1/FVC was 49% this is consistent with severe COPD stage III.  This was noted on 14 January 2016 PFTs.  She last saw Dr. Mortimer Fries on 19 November 2017.  She has not had any new symptomatology since that time.  Time she presented also for follow-up of a lung nodule that had resolved.  Baseline she has dyspnea class II-III ([facility]).  Patient has been overdue for follow-up, had missed yearly follow-up due to COVID-19 pandemic.  Does not endorse any fevers, chills or sweats.  No cough or sputum production.  She has become more active.  No chest pain, orthopnea or paroxysmal nocturnal dyspnea.  She is up-to-date on COVID-19 vaccine.  She remains abstinent of cigarettes.  Her prior visit she was placed on Trelegy Ellipta and notes that this has made a significant improvement in her respiratory status.  She has not had exacerbations since last seen.  Review of Systems A 10 point review of systems was performed and it is as noted above otherwise negative.  Allergies  Allergen Reactions  . Adhesive [Tape]     Tears skin  . Augmentin [Amoxicillin-Pot Clavulanate] Nausea Only    Dizziness  . Codeine Nausea Only    "dizzy"  . Contrast Media [Iodinated Diagnostic Agents] Hives  . Ibuprofen Hives and Swelling  . Iodine Other (See Comments)  . Levaquin [Levofloxacin In D5w] Nausea Only  . Morphine And Related Nausea Only    dizziness   Current Meds  Medication Sig  . acetaminophen (TYLENOL) 500 MG tablet Take 500 mg by mouth every 6 (six) hours as needed.  Marland Kitchen amLODipine (NORVASC) 2.5 MG tablet Take 2.5 mg by mouth daily.  . Calcium Citrate-Vitamin D  (CITRACAL + D PO) Take by mouth. Taking 1 daily  . cetirizine (ZYRTEC) 10 MG tablet Take 10 mg by mouth as needed for allergies (before remicade injection).  . Cholecalciferol (D3-1000 PO) Take by mouth. Taking 1 daily  . Cyanocobalamin (VITAMIN B 12 PO) Take by mouth. Taking 1 daily  . famotidine (PEPCID AC MAXIMUM STRENGTH) 20 MG tablet Take 20 mg by mouth daily.  . Fluticasone-Umeclidin-Vilant (TRELEGY ELLIPTA IN) Inhale into the lungs.  . latanoprost (XALATAN) 0.005 % ophthalmic solution Place 1 drop into both eyes at bedtime.   Marland Kitchen levothyroxine (SYNTHROID) 50 MCG tablet Take 50 mcg by mouth daily.  . psyllium (METAMUCIL) 58.6 % powder Take 1 packet by mouth 3 (three) times daily.  . VENTOLIN HFA 108 (90 Base) MCG/ACT inhaler Inhale 1-2 puffs into the lungs every 4 (four) hours as needed.    Immunization History  Administered Date(s) Administered  . Influenza,inj,Quad PF,6+ Mos 01/18/2016  . Moderna SARS-COVID-2 Vaccination 06/11/2019, 07/09/2019       Objective:   Physical Exam BP 126/76 (BP Location: Left Arm, Cuff Size: Normal)   Pulse 66   Temp 97.6 F (36.4 C) (Temporal)   Ht 5' 2"  (1.575 m)   Wt 124 lb 12.8 oz (56.6 kg)   SpO2 96% Comment: room air  BMI 22.83 kg/m   GENERAL: Awake, alert, no acute distress.  Chronic use of  accessories.  Fully ambulatory. HEAD: Normocephalic, atraumatic.  EYES: Pupils equal, round, reactive to light.  No scleral icterus.  MOUTH: Masking requirements. NECK: Supple. No thyromegaly. Trachea midline. No JVD.  No adenopathy. PULMONARY: Increased AP diameter.  Chronic use of accessories.  Distant breath sounds bilaterally, no adventitious sounds. CARDIOVASCULAR: S1 and S2. Regular rate and rhythm.  GASTROINTESTINAL: No abdominal distention. MUSCULOSKELETAL: No joint deformity, no clubbing, no edema.  NEUROLOGIC: No focal deficits noted, gait steady, speech fluent. SKIN: Intact,warm,dry.  Limited exam, no rashes.   PSYCH: Mood and behavior  normal.  Ambulatory oximetry was performed: Patient desaturated to 87% on room air after 3 laps (250 feet each) on 2 L/min the patient was able to maintain over 90% during ambulation.     Assessment & Plan:     ICD-10-CM   1. Stage 3 severe COPD by GOLD classification ([place])  J44.9    Continue Trelegy Ellipta 100/62.5/25, 1 inhalation daily  2. Chronic respiratory failure with hypoxia ([facility]CC)  J96.11    Continue oxygen at 2 L/min Patient has been compliant Patient has noted significant improvement in wellbeing  3. Former smoker  Z87.891    Patient continues to remain abstinent from cigarette use   Discussion:  Patient appears well compensated with regards to her COPD.  She continues to do well with Trelegy Ellipta and oxygen supplementation.  She continues to qualify for ongoing oxygen therapy.  She has been compliant oxygen use and has noted significant improvement in her wellbeing with oxygen supplementation.  Patient is to let us know if she would like to have an order placed for a portable oxygen concentrator.  She is "thinking about it".  Patient requested that her follow-up visits be done on a yearly basis.  Patient will follow up in a year's time.  She is to contact us should any new difficulties arise.  She follows usually with Dr. Patricia Pesa.   Renold Don, MD [facility]   *This note was dictated using voice recognition software/Dragon.  Despite best efforts to proofread, errors can occur which can change the meaning.  Any change was purely unintentional.

## 2019-11-13 NOTE — Patient Instructions (Signed)
You continue to qualify for oxygen, continue to use 2 L/min.   Continue your Trelegy.   Follow-up in 1 year's time per your request.

## 2019-11-14 ENCOUNTER — Encounter: Payer: Self-pay | Admitting: Pulmonary Disease

## 2020-07-04 ENCOUNTER — Encounter: Payer: Self-pay | Admitting: Internal Medicine

## 2020-07-04 ENCOUNTER — Inpatient Hospital Stay: Payer: Medicare Other

## 2020-07-04 ENCOUNTER — Emergency Department: Payer: Medicare Other

## 2020-07-04 ENCOUNTER — Inpatient Hospital Stay
Admission: EM | Admit: 2020-07-04 | Discharge: 2020-07-05 | DRG: 261 | Disposition: A | Discharge status: Other Acute Inpatient Hospital | Payer: Medicare Other | Attending: Cardiology | Admitting: Cardiology

## 2020-07-04 ENCOUNTER — Other Ambulatory Visit: Payer: Self-pay

## 2020-07-04 ENCOUNTER — Encounter
Admission: EM | Disposition: A | Discharge status: Other Acute Inpatient Hospital | Payer: Self-pay | Source: Home / Self Care | Attending: Internal Medicine

## 2020-07-04 DIAGNOSIS — E785 Hyperlipidemia, unspecified: Secondary | ICD-10-CM | POA: Diagnosis present

## 2020-07-04 DIAGNOSIS — M25552 Pain in left hip: Secondary | ICD-10-CM | POA: Diagnosis present

## 2020-07-04 DIAGNOSIS — F419 Anxiety disorder, unspecified: Secondary | ICD-10-CM | POA: Diagnosis present

## 2020-07-04 DIAGNOSIS — I499 Cardiac arrhythmia, unspecified: Secondary | ICD-10-CM | POA: Diagnosis present

## 2020-07-04 DIAGNOSIS — Z880 Allergy status to penicillin: Secondary | ICD-10-CM | POA: Diagnosis not present

## 2020-07-04 DIAGNOSIS — Z961 Presence of intraocular lens: Secondary | ICD-10-CM | POA: Diagnosis present

## 2020-07-04 DIAGNOSIS — Z950 Presence of cardiac pacemaker: Secondary | ICD-10-CM | POA: Diagnosis not present

## 2020-07-04 DIAGNOSIS — K5100 Ulcerative (chronic) pancolitis without complications: Secondary | ICD-10-CM | POA: Diagnosis present

## 2020-07-04 DIAGNOSIS — Z91041 Radiographic dye allergy status: Secondary | ICD-10-CM

## 2020-07-04 DIAGNOSIS — J449 Chronic obstructive pulmonary disease, unspecified: Secondary | ICD-10-CM | POA: Diagnosis not present

## 2020-07-04 DIAGNOSIS — Z79899 Other long term (current) drug therapy: Secondary | ICD-10-CM

## 2020-07-04 DIAGNOSIS — Y92009 Unspecified place in unspecified non-institutional (private) residence as the place of occurrence of the external cause: Secondary | ICD-10-CM | POA: Diagnosis not present

## 2020-07-04 DIAGNOSIS — R55 Syncope and collapse: Secondary | ICD-10-CM | POA: Diagnosis not present

## 2020-07-04 DIAGNOSIS — Z87891 Personal history of nicotine dependence: Secondary | ICD-10-CM | POA: Diagnosis not present

## 2020-07-04 DIAGNOSIS — W010XXA Fall on same level from slipping, tripping and stumbling without subsequent striking against object, initial encounter: Secondary | ICD-10-CM | POA: Diagnosis present

## 2020-07-04 DIAGNOSIS — I2510 Atherosclerotic heart disease of native coronary artery without angina pectoris: Secondary | ICD-10-CM | POA: Diagnosis present

## 2020-07-04 DIAGNOSIS — E890 Postprocedural hypothyroidism: Secondary | ICD-10-CM | POA: Diagnosis present

## 2020-07-04 DIAGNOSIS — K219 Gastro-esophageal reflux disease without esophagitis: Secondary | ICD-10-CM | POA: Diagnosis present

## 2020-07-04 DIAGNOSIS — Z9841 Cataract extraction status, right eye: Secondary | ICD-10-CM | POA: Diagnosis not present

## 2020-07-04 DIAGNOSIS — I442 Atrioventricular block, complete: Principal | ICD-10-CM | POA: Diagnosis present

## 2020-07-04 DIAGNOSIS — K5190 Ulcerative colitis, unspecified, without complications: Secondary | ICD-10-CM | POA: Diagnosis present

## 2020-07-04 DIAGNOSIS — I10 Essential (primary) hypertension: Secondary | ICD-10-CM | POA: Diagnosis present

## 2020-07-04 DIAGNOSIS — Z886 Allergy status to analgesic agent status: Secondary | ICD-10-CM | POA: Diagnosis not present

## 2020-07-04 DIAGNOSIS — D72829 Elevated white blood cell count, unspecified: Secondary | ICD-10-CM | POA: Diagnosis not present

## 2020-07-04 DIAGNOSIS — I452 Bifascicular block: Secondary | ICD-10-CM | POA: Diagnosis present

## 2020-07-04 DIAGNOSIS — W19XXXA Unspecified fall, initial encounter: Secondary | ICD-10-CM

## 2020-07-04 DIAGNOSIS — E039 Hypothyroidism, unspecified: Secondary | ICD-10-CM | POA: Diagnosis not present

## 2020-07-04 DIAGNOSIS — I495 Sick sinus syndrome: Secondary | ICD-10-CM | POA: Diagnosis not present

## 2020-07-04 DIAGNOSIS — Z91048 Other nonmedicinal substance allergy status: Secondary | ICD-10-CM | POA: Diagnosis not present

## 2020-07-04 DIAGNOSIS — Z8249 Family history of ischemic heart disease and other diseases of the circulatory system: Secondary | ICD-10-CM | POA: Diagnosis not present

## 2020-07-04 DIAGNOSIS — Z881 Allergy status to other antibiotic agents status: Secondary | ICD-10-CM

## 2020-07-04 DIAGNOSIS — Z885 Allergy status to narcotic agent status: Secondary | ICD-10-CM | POA: Diagnosis not present

## 2020-07-04 DIAGNOSIS — I4430 Unspecified atrioventricular block: Secondary | ICD-10-CM | POA: Diagnosis present

## 2020-07-04 DIAGNOSIS — Z79890 Hormone replacement therapy (postmenopausal): Secondary | ICD-10-CM

## 2020-07-04 DIAGNOSIS — Z20822 Contact with and (suspected) exposure to covid-19: Secondary | ICD-10-CM | POA: Diagnosis present

## 2020-07-04 DIAGNOSIS — Z888 Allergy status to other drugs, medicaments and biological substances status: Secondary | ICD-10-CM

## 2020-07-04 HISTORY — PX: TEMPORARY PACEMAKER: CATH118268

## 2020-07-04 HISTORY — PX: LEFT HEART CATH AND CORONARY ANGIOGRAPHY: CATH118249

## 2020-07-04 HISTORY — DX: Syncope and collapse: R55

## 2020-07-04 LAB — CBC WITH DIFFERENTIAL/PLATELET
Abs Immature Granulocytes: 0.04 10*3/uL (ref 0.00–0.07)
Basophils Absolute: 0.1 10*3/uL (ref 0.0–0.1)
Basophils Relative: 1 %
Eosinophils Absolute: 0.1 10*3/uL (ref 0.0–0.5)
Eosinophils Relative: 1 %
HCT: 45.7 % (ref 36.0–46.0)
Hemoglobin: 14.9 g/dL (ref 12.0–15.0)
Immature Granulocytes: 0 %
Lymphocytes Relative: 17 %
Lymphs Abs: 2 10*3/uL (ref 0.7–4.0)
MCH: 28.9 pg (ref 26.0–34.0)
MCHC: 32.6 g/dL (ref 30.0–36.0)
MCV: 88.7 fL (ref 80.0–100.0)
Monocytes Absolute: 0.7 10*3/uL (ref 0.1–1.0)
Monocytes Relative: 6 %
Neutro Abs: 8.9 10*3/uL — ABNORMAL HIGH (ref 1.7–7.7)
Neutrophils Relative %: 75 %
Platelets: 309 10*3/uL (ref 150–400)
RBC: 5.15 MIL/uL — ABNORMAL HIGH (ref 3.87–5.11)
RDW: 13.6 % (ref 11.5–15.5)
WBC: 11.9 10*3/uL — ABNORMAL HIGH (ref 4.0–10.5)
nRBC: 0 % (ref 0.0–0.2)

## 2020-07-04 LAB — MRSA PCR SCREENING: MRSA by PCR: NEGATIVE

## 2020-07-04 LAB — PROTIME-INR
INR: 1 (ref 0.8–1.2)
Prothrombin Time: 12.4 seconds (ref 11.4–15.2)

## 2020-07-04 LAB — COMPREHENSIVE METABOLIC PANEL
ALT: 17 U/L (ref 0–44)
AST: 26 U/L (ref 15–41)
Albumin: 4.2 g/dL (ref 3.5–5.0)
Alkaline Phosphatase: 79 U/L (ref 38–126)
Anion gap: 9 (ref 5–15)
BUN: 18 mg/dL (ref 8–23)
CO2: 25 mmol/L (ref 22–32)
Calcium: 9.9 mg/dL (ref 8.9–10.3)
Chloride: 106 mmol/L (ref 98–111)
Creatinine, Ser: 0.81 mg/dL (ref 0.44–1.00)
GFR, Estimated: >60 mL/min (ref >60)
Glucose, Bld: 106 mg/dL — ABNORMAL HIGH (ref 70–99)
Potassium: 3.9 mmol/L (ref 3.5–5.1)
Sodium: 140 mmol/L (ref 135–145)
Total Bilirubin: 0.6 mg/dL (ref 0.3–1.2)
Total Protein: 8.1 g/dL (ref 6.5–8.1)

## 2020-07-04 LAB — URINALYSIS, COMPLETE (UACMP) WITH MICROSCOPIC
Bacteria, UA: NONE SEEN
Bilirubin Urine: NEGATIVE
Glucose, UA: NEGATIVE mg/dL
Ketones, ur: NEGATIVE mg/dL
Leukocytes,Ua: NEGATIVE
Nitrite: NEGATIVE
Protein, ur: NEGATIVE mg/dL
Specific Gravity, Urine: 1.009 (ref 1.005–1.030)
pH: 7 (ref 5.0–8.0)

## 2020-07-04 LAB — APTT: aPTT: 43 seconds — ABNORMAL HIGH (ref 24–36)

## 2020-07-04 LAB — RESP PANEL BY RT-PCR (FLU A&B, COVID) ARPGX2
Influenza A by PCR: NEGATIVE
Influenza B by PCR: NEGATIVE
SARS Coronavirus 2 by RT PCR: NEGATIVE

## 2020-07-04 LAB — BRAIN NATRIURETIC PEPTIDE: B Natriuretic Peptide: 78.2 pg/mL (ref 0.0–100.0)

## 2020-07-04 LAB — TSH: TSH: 2.46 u[IU]/mL (ref 0.350–4.500)

## 2020-07-04 LAB — TROPONIN I (HIGH SENSITIVITY)
Troponin I (High Sensitivity): 11 ng/L (ref <18)
Troponin I (High Sensitivity): 14 ng/L (ref <18)

## 2020-07-04 LAB — MAGNESIUM: Magnesium: 2.3 mg/dL (ref 1.7–2.4)

## 2020-07-04 LAB — GLUCOSE, CAPILLARY: Glucose-Capillary: 130 mg/dL — ABNORMAL HIGH (ref 70–99)

## 2020-07-04 SURGERY — LEFT HEART CATH AND CORONARY ANGIOGRAPHY
Anesthesia: Moderate Sedation

## 2020-07-04 MED ORDER — OXYCODONE-ACETAMINOPHEN 5-325 MG PO TABS
1.0000 | ORAL_TABLET | ORAL | Status: DC | PRN
Start: 2020-07-04 — End: 2020-07-05

## 2020-07-04 MED ORDER — SODIUM CHLORIDE 0.9 % WEIGHT BASED INFUSION: 3.0000 mL/kg/h | INTRAVENOUS | Status: AC

## 2020-07-04 MED ORDER — VITAMIN D 25 MCG (1000 UNIT) PO TABS
1000.0000 [IU] | ORAL_TABLET | Freq: Every day | ORAL | Status: DC
  Administered 2020-07-05: 1000 [IU] via ORAL
  Filled 2020-07-04: qty 1

## 2020-07-04 MED ORDER — METHYLPREDNISOLONE SODIUM SUCC 125 MG IJ SOLR
125.0000 mg | INTRAMUSCULAR | Status: AC
  Administered 2020-07-04: 125 mg via INTRAVENOUS
  Filled 2020-07-04: qty 2

## 2020-07-04 MED ORDER — SODIUM CHLORIDE 0.9 % IV SOLN: 250.0000 mL | INTRAVENOUS | Status: DC | PRN

## 2020-07-04 MED ORDER — LIDOCAINE HCL (PF) 1 % IJ SOLN
INTRAMUSCULAR | Status: AC
  Filled 2020-07-04: qty 30

## 2020-07-04 MED ORDER — CHLORHEXIDINE GLUCONATE CLOTH 2 % EX PADS
6.0000 | MEDICATED_PAD | Freq: Every day | CUTANEOUS | Status: DC
  Administered 2020-07-04: 6 via TOPICAL

## 2020-07-04 MED ORDER — DIPHENHYDRAMINE HCL 50 MG/ML IJ SOLN
25.0000 mg | INTRAMUSCULAR | Status: AC
  Administered 2020-07-04: 25 mg via INTRAVENOUS
  Filled 2020-07-04: qty 1

## 2020-07-04 MED ORDER — ONDANSETRON HCL 4 MG/2ML IJ SOLN
INTRAMUSCULAR | Status: AC
  Filled 2020-07-04: qty 2

## 2020-07-04 MED ORDER — LIDOCAINE HCL (PF) 1 % IJ SOLN
INTRAMUSCULAR | Status: DC | PRN
  Administered 2020-07-04: 10 mL

## 2020-07-04 MED ORDER — MIDAZOLAM HCL 2 MG/2ML IJ SOLN
INTRAMUSCULAR | Status: DC | PRN
  Administered 2020-07-04: 0.5 mg via INTRAVENOUS

## 2020-07-04 MED ORDER — SODIUM CHLORIDE 0.9 % IV SOLN: INTRAVENOUS | Status: DC

## 2020-07-04 MED ORDER — VITAMIN B-12 100 MCG PO TABS
100.0000 ug | ORAL_TABLET | Freq: Every day | ORAL | Status: DC
  Administered 2020-07-05: 100 ug via ORAL
  Filled 2020-07-04: qty 1

## 2020-07-04 MED ORDER — CALCIUM CARBONATE ANTACID 500 MG PO CHEW
1.0000 | CHEWABLE_TABLET | Freq: Four times a day (QID) | ORAL | Status: DC | PRN
  Administered 2020-07-04: 200 mg via ORAL
  Filled 2020-07-04: qty 1

## 2020-07-04 MED ORDER — VERAPAMIL HCL 2.5 MG/ML IV SOLN
INTRAVENOUS | Status: DC | PRN
  Administered 2020-07-04: 2.5 mg via INTRAVENOUS

## 2020-07-04 MED ORDER — HEPARIN SODIUM (PORCINE) 5000 UNIT/ML IJ SOLN
5000.0000 [IU] | Freq: Three times a day (TID) | INTRAMUSCULAR | Status: DC
  Administered 2020-07-04 – 2020-07-05 (×2): 5000 [IU] via SUBCUTANEOUS
  Filled 2020-07-04 (×2): qty 1

## 2020-07-04 MED ORDER — ONDANSETRON HCL 4 MG/2ML IJ SOLN
INTRAMUSCULAR | Status: DC | PRN
  Administered 2020-07-04: 4 mg via INTRAVENOUS

## 2020-07-04 MED ORDER — VERAPAMIL HCL 2.5 MG/ML IV SOLN
INTRAVENOUS | Status: AC
  Filled 2020-07-04: qty 2

## 2020-07-04 MED ORDER — HEPARIN SODIUM (PORCINE) 1000 UNIT/ML IJ SOLN
INTRAMUSCULAR | Status: DC | PRN
  Administered 2020-07-04: 2500 [IU] via INTRAVENOUS

## 2020-07-04 MED ORDER — HYDRALAZINE HCL 20 MG/ML IJ SOLN
10.0000 mg | INTRAMUSCULAR | Status: AC | PRN
Start: 2020-07-04 — End: 2020-07-04

## 2020-07-04 MED ORDER — ACETAMINOPHEN 500 MG PO TABS
1000.0000 mg | ORAL_TABLET | Freq: Once | ORAL | Status: DC
  Filled 2020-07-04: qty 2

## 2020-07-04 MED ORDER — LEVOTHYROXINE SODIUM 50 MCG PO TABS
50.0000 ug | ORAL_TABLET | Freq: Every day | ORAL | Status: DC
  Administered 2020-07-05: 50 ug via ORAL
  Filled 2020-07-04: qty 1

## 2020-07-04 MED ORDER — FENTANYL CITRATE (PF) 100 MCG/2ML IJ SOLN
INTRAMUSCULAR | Status: DC | PRN
  Administered 2020-07-04 (×2): 12.5 ug via INTRAVENOUS

## 2020-07-04 MED ORDER — AMLODIPINE BESYLATE 5 MG PO TABS
2.5000 mg | ORAL_TABLET | Freq: Every day | ORAL | Status: DC
  Administered 2020-07-04: 2.5 mg via ORAL
  Filled 2020-07-04: qty 1

## 2020-07-04 MED ORDER — HEPARIN SODIUM (PORCINE) 5000 UNIT/ML IJ SOLN: 5000.0000 [IU] | Freq: Three times a day (TID) | INTRAMUSCULAR | Status: DC

## 2020-07-04 MED ORDER — SODIUM CHLORIDE 0.9 % IV SOLN: INTRAVENOUS | Status: AC

## 2020-07-04 MED ORDER — SODIUM CHLORIDE 0.9% FLUSH
3.0000 mL | Freq: Two times a day (BID) | INTRAVENOUS | Status: DC
  Administered 2020-07-04 – 2020-07-05 (×2): 3 mL via INTRAVENOUS

## 2020-07-04 MED ORDER — IOHEXOL 300 MG/ML  SOLN
INTRAMUSCULAR | Status: DC | PRN
  Administered 2020-07-04: 45 mL

## 2020-07-04 MED ORDER — ACETAMINOPHEN 325 MG PO TABS
650.0000 mg | ORAL_TABLET | Freq: Four times a day (QID) | ORAL | Status: DC | PRN
  Administered 2020-07-04 – 2020-07-05 (×2): 650 mg via ORAL
  Filled 2020-07-04 (×2): qty 2

## 2020-07-04 MED ORDER — ONDANSETRON HCL 4 MG/2ML IJ SOLN: 4.0000 mg | Freq: Three times a day (TID) | INTRAMUSCULAR | Status: DC | PRN

## 2020-07-04 MED ORDER — FAMOTIDINE 20 MG PO TABS: 20.0000 mg | ORAL_TABLET | Freq: Every day | ORAL | Status: DC

## 2020-07-04 MED ORDER — HEPARIN SODIUM (PORCINE) 1000 UNIT/ML IJ SOLN
INTRAMUSCULAR | Status: AC
  Filled 2020-07-04: qty 1

## 2020-07-04 MED ORDER — UMECLIDINIUM BROMIDE 62.5 MCG/INH IN AEPB
1.0000 | INHALATION_SPRAY | Freq: Every day | RESPIRATORY_TRACT | Status: DC
  Administered 2020-07-04 – 2020-07-05 (×2): 1 via RESPIRATORY_TRACT
  Filled 2020-07-04: qty 7

## 2020-07-04 MED ORDER — SODIUM CHLORIDE 0.9 % WEIGHT BASED INFUSION
1.0000 mL/kg/h | INTRAVENOUS | Status: DC
  Administered 2020-07-05: 1 mL/kg/h via INTRAVENOUS

## 2020-07-04 MED ORDER — DM-GUAIFENESIN ER 30-600 MG PO TB12: 1.0000 | ORAL_TABLET | Freq: Two times a day (BID) | ORAL | Status: DC | PRN

## 2020-07-04 MED ORDER — LIDOCAINE 5 % EX PTCH
1.0000 | MEDICATED_PATCH | CUTANEOUS | Status: DC
  Administered 2020-07-04 – 2020-07-05 (×2): 1 via TRANSDERMAL
  Filled 2020-07-04 (×2): qty 1

## 2020-07-04 MED ORDER — HEPARIN (PORCINE) IN NACL 1000-0.9 UT/500ML-% IV SOLN
INTRAVENOUS | Status: AC
  Filled 2020-07-04: qty 1000

## 2020-07-04 MED ORDER — PSYLLIUM 95 % PO PACK
1.0000 | PACK | Freq: Three times a day (TID) | ORAL | Status: DC
  Filled 2020-07-04 (×4): qty 1

## 2020-07-04 MED ORDER — SODIUM CHLORIDE 0.9% FLUSH: 3.0000 mL | INTRAVENOUS | Status: DC | PRN

## 2020-07-04 MED ORDER — HYDRALAZINE HCL 20 MG/ML IJ SOLN: 5.0000 mg | INTRAMUSCULAR | Status: DC | PRN

## 2020-07-04 MED ORDER — FENTANYL CITRATE (PF) 100 MCG/2ML IJ SOLN
INTRAMUSCULAR | Status: AC
  Filled 2020-07-04: qty 2

## 2020-07-04 MED ORDER — ATROPINE SULFATE 1 MG/10ML IJ SOSY: 1.0000 mg | PREFILLED_SYRINGE | INTRAMUSCULAR | Status: DC | PRN

## 2020-07-04 MED ORDER — PANTOPRAZOLE SODIUM 40 MG IV SOLR
40.0000 mg | Freq: Every day | INTRAVENOUS | Status: DC
  Administered 2020-07-04 – 2020-07-05 (×2): 40 mg via INTRAVENOUS
  Filled 2020-07-04 (×2): qty 40

## 2020-07-04 MED ORDER — FLUTICASONE FUROATE-VILANTEROL 100-25 MCG/INH IN AEPB
1.0000 | INHALATION_SPRAY | Freq: Every day | RESPIRATORY_TRACT | Status: DC
  Administered 2020-07-04 – 2020-07-05 (×2): 1 via RESPIRATORY_TRACT
  Filled 2020-07-04: qty 28

## 2020-07-04 MED ORDER — HEPARIN (PORCINE) IN NACL 1000-0.9 UT/500ML-% IV SOLN
INTRAVENOUS | Status: DC | PRN
  Administered 2020-07-04: 500 mL

## 2020-07-04 MED ORDER — ASPIRIN 81 MG PO CHEW: 81.0000 mg | CHEWABLE_TABLET | ORAL | Status: DC

## 2020-07-04 MED ORDER — ALBUTEROL SULFATE HFA 108 (90 BASE) MCG/ACT IN AERS
2.0000 | INHALATION_SPRAY | RESPIRATORY_TRACT | Status: DC | PRN
  Filled 2020-07-04: qty 6.7

## 2020-07-04 MED ORDER — MIDAZOLAM HCL 2 MG/2ML IJ SOLN
INTRAMUSCULAR | Status: AC
  Filled 2020-07-04: qty 2

## 2020-07-04 MED ORDER — LATANOPROST 0.005 % OP SOLN
1.0000 [drp] | Freq: Every day | OPHTHALMIC | Status: DC
  Administered 2020-07-04: 1 [drp] via OPHTHALMIC
  Filled 2020-07-04: qty 2.5

## 2020-07-04 MED ORDER — FLUTICASONE-UMECLIDIN-VILANT 100-62.5-25 MCG/INH IN AEPB: 2.0000 | INHALATION_SPRAY | Freq: Every day | RESPIRATORY_TRACT | Status: DC

## 2020-07-04 SURGICAL SUPPLY — 17 items
CABLE ADAPT CONN TEMP 6FT (ADAPTER) ×1 IMPLANT
CANNULA 5F STIFF (CANNULA) ×1 IMPLANT
CATH INFINITI 5 FR JL3.5 (CATHETERS) ×1 IMPLANT
CATH INFINITI JR4 5F (CATHETERS) ×1 IMPLANT
COVER EZ STRL 42X30 (DRAPES) ×1 IMPLANT
COVER PROBE U/S 5X48 (MISCELLANEOUS) ×1 IMPLANT
DEVICE RAD TR BAND REGULAR (VASCULAR PRODUCTS) ×1 IMPLANT
DRAPE BRACHIAL (DRAPES) ×2 IMPLANT
GLIDESHEATH SLEND SS 6F .021 (SHEATH) ×1 IMPLANT
GUIDEWIRE INQWIRE 1.5J.035X260 (WIRE) IMPLANT
INQWIRE 1.5J .035X260CM (WIRE) ×2
KIT MANI 3VAL PERCEP (MISCELLANEOUS) ×1 IMPLANT
PACK CARDIAC CATH (CUSTOM PROCEDURE TRAY) ×2 IMPLANT
SHEATH AVANTI 6FR X 11CM (SHEATH) ×1 IMPLANT
SLEEVE REPOSITIONING LENGTH 30 (MISCELLANEOUS) ×1 IMPLANT
SUT SILK 0 SH 30 (SUTURE) ×1 IMPLANT
WIRE PACING TEMP ST TIP 5 (CATHETERS) ×1 IMPLANT

## 2020-07-04 NOTE — ED Triage Notes (Signed)
Pt BIBA via [facility] c/o of Lft hip pain from a fall at home. Pt denies taking blood thinners. Pt does report hitting head but denies LOC. Pt has hx of COPD.

## 2020-07-04 NOTE — ED Notes (Signed)
Pt placed on bedpan

## 2020-07-04 NOTE — H&P (Signed)
History and Physical    Sheila Peters YHC:[id number] DOB: 20-Jan-1941 DOA: 07/04/2020  Referring MD/NP/PA:   PCP: Tracie Harrier, MD   Patient coming from:  The patient is coming from home.  At baseline, pt is independent for most of ADL.        Chief Complaint: fall, syncope, left hip pain.  HPI: Sheila Peters is a 80 y.o. female with medical history significant of hypertension, COPD, asthma, GERD, ulcerative colitis, kidney stone, PVC, who presents with fall, syncope and left hip pain.  Per her daughter, patient fell in the early morning, possibly hit her head and injured her left hip.  Patient complains of left hip pain.  Denies loss of consciousness.  No unilateral numbness or tingling. No facial droop or slurred speech. While undergoing initial work-up in the emergency department, pt was noted to briefly lose consciousness again.  Telemetry showed complete heart block.  Heart rate was down to 30s, then comes back to 50-60.  Patient has mild cough and mild shortness of breath due to COPD which has not changed.  Denies chest pain, fever or chills.  No nausea, vomiting, diarrhea, abdominal pain, symptoms of UTI.  ED Course: pt was found to have troponin level 11, negative Covid PCR, INR 1.0, PTT 43, negative urinalysis, electrolytes renal function okay, temperature normal, blood pressure 149/87, RR 23, 12, oxygen saturation 95% on room air.  Chest x-ray showed emphysema.  CT of head and CT of C-spine negative for acute issues.  X-ray of left hip/pelvis negative for fracture.  Patient is admitted to ICU as inpatient.  Dr. Rockey Situ for cardiology is consulted  Review of Systems:   General: no fevers, chills, no body weight gain, has fatigue HEENT: no blurry vision, hearing changes or sore throat Respiratory: has dyspnea, coughing, no wheezing CV: no chest pain, no palpitations GI: no nausea, vomiting, abdominal pain, diarrhea, constipation GU: no dysuria, burning on urination,  increased urinary frequency, hematuria  Ext: no leg edema Neuro: no unilateral weakness, numbness, or tingling, no vision change or hearing loss. Fall and syncope Skin: no rash, no skin tear. MSK: No muscle spasm, no deformity, no limitation of range of movement in spin Heme: No easy bruising.  Travel history: No recent long distant travel.  Allergy:  Allergies  Allergen Reactions  . Adhesive [Tape]     Tears skin  . Augmentin [Amoxicillin-Pot Clavulanate] Nausea Only    Dizziness  . Codeine Nausea Only    "dizzy"  . Contrast Media [Iodinated Diagnostic Agents] Hives  . Ibuprofen Hives and Swelling  . Iodine Other (See Comments)  . Levaquin [Levofloxacin In D5w] Nausea Only  . Morphine And Related Nausea Only    dizziness    Past Medical History:  Diagnosis Date  . Arthritis   . Asthma   . Clostridium difficile diarrhea   . Colitis, nonspecific   . COPD (chronic obstructive pulmonary disease) ([place])   . GERD (gastroesophageal reflux disease)   . Hypothyroidism    post-surgical  . Multiple thyroid nodules    post excision  . Nephrolithiasis    spontaneously passed  . Nephrolithiasis   . Non-toxic uninodular goiter   . Osteoporosis, postmenopausal   . Ulcerative colitis, chronic ([place])   . Ulcerative pancolitis without complication [facility])     Past Surgical History:  Procedure Laterality Date  . ABDOMINAL HYSTERECTOMY    . ANKLE SURGERY     left fracture  . BACK SURGERY     herniated  disk  . CATARACT EXTRACTION W/PHACO Right 10/23/2018   Procedure: CATARACT EXTRACTION PHACO AND INTRAOCULAR LENS PLACEMENT ([place]) RIGHT;  Surgeon: Leandrew Koyanagi, MD;  Location: [place];  Service: Ophthalmology;  Laterality: Right;  PT WANTS TO BE LAST CASE  . COLONOSCOPY N/A 02/26/2017   Procedure: COLONOSCOPY;  Surgeon: Manya Silvas, MD;  Location[facility] ENDOSCOPY;  Service: Endoscopy;  Laterality: N/A;  . COLONOSCOPY WITH PROPOFOL N/A 11/02/2014   Procedure:  COLONOSCOPY WITH PROPOFOL;  Surgeon: Josefine Class, MD;  Location: [facility] ENDOSCOPY;  Service: Endoscopy;  Laterality: N/A;  . excision of thyroid nodule    . 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 JOINT REPLACEMENT     2014 lth    Social History:  reports that she quit smoking about 6 years ago. Her smoking use included cigarettes. She has a 25.00 pack-year smoking history. She has never used smokeless tobacco. She reports current alcohol use. She reports that she does not use drugs.  Family History:  Family History  Problem Relation Age of Onset  . Heart disease Mother        died at 73     Prior to Admission medications   Medication Sig Start Date End Date Taking? Authorizing Provider  acetaminophen (TYLENOL) 500 MG tablet Take 500 mg by mouth every 6 (six) hours as needed.    [provider]  amLODipine (NORVASC) 2.5 MG tablet Take 2.5 mg by mouth daily.    [provider]  Calcium Citrate-Vitamin D (CITRACAL + D PO) Take by mouth. Taking 1 daily    [provider]  cetirizine (ZYRTEC) 10 MG tablet Take 10 mg by mouth as needed for allergies (before remicade injection).    [provider]  Cholecalciferol (D3-1000 PO) Take by mouth. Taking 1 daily    [provider]  Cyanocobalamin (VITAMIN B 12 PO) Take by mouth. Taking 1 daily    [provider]  famotidine (PEPCID AC MAXIMUM STRENGTH) 20 MG tablet Take 20 mg by mouth daily.    [provider]  Fluticasone-Umeclidin-Vilant (TRELEGY ELLIPTA IN) Inhale into the lungs.    [provider]  inFLIXimab (REMICADE) 100 MG injection Inject 100 mg into the vein as directed. Patient not taking: Reported on 11/13/2019 04/03/17   [provider]  latanoprost (XALATAN) 0.005 % ophthalmic solution Place 1 drop into both eyes at bedtime.   10/09/19   [provider]  levothyroxine (SYNTHROID) 50 MCG tablet Take 50 mcg by mouth daily. 09/19/19   [provider]  psyllium (METAMUCIL) 58.6 % powder Take 1 packet by mouth 3 (three) times daily.    [provider]  VENTOLIN HFA 108 (90 Base) MCG/ACT inhaler Inhale 1-2 puffs into the lungs every 4 (four) hours as needed.  09/08/16   [provider]    Physical Exam: Vitals:   07/04/20 1200 07/04/20 1215 07/04/20 1230 07/04/20 1245  BP: (!) 144/84 (!) 152/79 (!) 142/79 (!) 159/85  Pulse: 66 65 65 71  Resp: 15 17 15 17   Temp:      TempSrc:      SpO2: 94% 95% 93% 94%  Weight:      Height:       General: Not in acute distress HEENT:       Eyes: PERRL, EOMI, no scleral icterus.       ENT:  No discharge from the ears and nose, no pharynx injection, no tonsillar enlargement.        Neck: No JVD, no bruit, no mass felt. Heme: No neck lymph node enlargement. Cardiac: S1/S2, RRR, No murmurs, No gallops or rubs. Respiratory: No rales, wheezing, rhonchi or rubs. GI: Soft, nondistended, nontender, no rebound pain, no organomegaly, BS present. GU: No hematuria Ext: No pitting leg edema bilaterally. 1+DP/PT pulse bilaterally. Musculoskeletal: No joint deformities, No joint redness or warmth, no limitation of ROM in spin. Skin: No rashes.  Neuro: Alert, oriented X3, cranial nerves II-XII grossly intact, moves all extremities normally. Psych: Patient is not psychotic, no suicidal or hemocidal ideation.  Labs on Admission: I have personally reviewed following labs and imaging studies  CBC: Recent Labs  Lab 07/04/20 0810  WBC 11.9*  NEUTROABS 8.9*  HGB 14.9  HCT 45.7  MCV 88.7  PLT 977   Basic Metabolic Panel: Recent Labs  Lab 07/04/20 0810  NA 140  K 3.9  CL 106  CO2 25  GLUCOSE 106*  BUN 18  CREATININE 0.81  CALCIUM 9.9  MG 2.3   GFR: Estimated Creatinine Clearance: 45.3 mL/min (by C-G formula based on SCr of 0.81 mg/dL). Liver  Function Tests: Recent Labs  Lab 07/04/20 0810  AST 26  ALT 17  ALKPHOS 79  BILITOT 0.6  PROT 8.1  ALBUMIN 4.2   No results for input(s): LIPASE, AMYLASE in the last 168 hours. No results for input(s): AMMONIA in the last 168 hours. Coagulation Profile: Recent Labs  Lab 07/04/20 0810  INR 1.0   Cardiac Enzymes: No results for input(s): CKTOTAL, CKMB, CKMBINDEX, TROPONINI in the last 168 hours. BNP (last 3 results) No results for input(s): PROBNP in the last 8760 hours. HbA1C: No results for input(s): HGBA1C in the last 72 hours. CBG: No results for input(s): GLUCAP in the last 168 hours. Lipid Profile: No results for input(s): CHOL, HDL, LDLCALC, TRIG, CHOLHDL, LDLDIRECT in the last 72 hours. Thyroid Function Tests: Recent Labs    07/04/20 0810  TSH 2.460   Anemia Panel: No results for input(s): VITAMINB12, FOLATE, FERRITIN, TIBC, IRON, RETICCTPCT in the last 72 hours. Urine analysis:    Component Value Date/Time   COLORURINE STRAW (A) 07/04/2020 0823   APPEARANCEUR CLEAR (A) 07/04/2020 0823   APPEARANCEUR Turbid 08/04/2011 0905   LABSPEC 1.009 07/04/2020 0823   LABSPEC 1.024 08/04/2011 0905   PHURINE 7.0 07/04/2020 0823   GLUCOSEU NEGATIVE 07/04/2020 0823   GLUCOSEU Negative 08/04/2011 0905   HGBUR SMALL (A) 07/04/2020 0823   BILIRUBINUR NEGATIVE 07/04/2020 0823   BILIRUBINUR Negative 08/04/2011 0905   KETONESUR NEGATIVE 07/04/2020 0823   PROTEINUR NEGATIVE 07/04/2020 0823   NITRITE NEGATIVE 07/04/2020 0823   LEUKOCYTESUR NEGATIVE 07/04/2020 0823   LEUKOCYTESUR 3+ 08/04/2011 0905   Sepsis Labs: @LABRCNTIP (procalcitonin:4,lacticidven:4) ) Recent Results (from the past 240 hour(s))  Resp Panel by RT-PCR (Flu A&B, Covid) Nasopharyngeal Swab     Status: None   Collection Time: 07/04/20  7:57 AM   Specimen: Nasopharyngeal Swab; Nasopharyngeal(NP) swabs in vial transport medium  Result Value Ref Range Status   SARS Coronavirus 2 by RT PCR NEGATIVE NEGATIVE  Final    Comment: (NOTE) SARS-CoV-2 target nucleic acids are NOT DETECTED.  The SARS-CoV-2 RNA is generally detectable in upper respiratory specimens during the acute phase of infection. The lowest concentration of SARS-CoV-2 viral copies this assay can detect is 138 copies/mL. A negative result does not preclude SARS-Cov-2 infection and should not  be used as the sole basis for treatment or other patient management decisions. A negative result may occur with  improper specimen collection/handling, submission of specimen other than nasopharyngeal swab, presence of viral mutation(s) within the areas targeted by this assay, and inadequate number of viral copies(<138 copies/mL). A negative result must be combined with clinical observations, patient history, and epidemiological information. The expected result is Negative.  Fact Sheet for Patients:  EntrepreneurPulse.com.au  Fact Sheet for Healthcare Providers:  IncredibleEmployment.be  This test is no t yet approved or cleared by the [place] FDA and  has been authorized for detection and/or diagnosis of SARS-CoV-2 by FDA under an Emergency Use Authorization (EUA). This EUA will remain  in effect (meaning this test can be used) for the duration of the COVID-19 declaration under Section 564(b)(1) of the Act, 21 U.S.C.section 360bbb-3(b)(1), unless the authorization is terminated  or revoked sooner.       Influenza A by PCR NEGATIVE NEGATIVE Final   Influenza B by PCR NEGATIVE NEGATIVE Final    Comment: (NOTE) The Xpert Xpress SARS-CoV-2/FLU/RSV plus assay is intended as an aid in the diagnosis of influenza from Nasopharyngeal swab specimens and should not be used as a sole basis for treatment. Nasal washings and aspirates are unacceptable for Xpert Xpress SARS-CoV-2/FLU/RSV testing.  Fact Sheet for Patients: EntrepreneurPulse.com.au  Fact Sheet for Healthcare  Providers: IncredibleEmployment.be  This test is not yet approved or cleared by the [place] FDA and has been authorized for detection and/or diagnosis of SARS-CoV-2 by FDA under an Emergency Use Authorization (EUA). This EUA will remain in effect (meaning this test can be used) for the duration of the COVID-19 declaration under Section 564(b)(1) of the Act, 21 U.S.C. section 360bbb-3(b)(1), unless the authorization is terminated or revoked.  Performed at [facility], [address], [place], [place] [address]      Radiological Exams on Admission: DG Chest 1 View  Result Date: 07/04/2020 CLINICAL DATA:  Pain following fall EXAM: CHEST  1 VIEW COMPARISON:  April 06, 2014 chest radiograph; chest CT September 30, 2019 FINDINGS: Underlying emphysematous changes better delineated on prior CT. There is no edema or airspace opacity. Heart is borderline enlarged with pulmonary vascularity normal. No adenopathy. No bone lesions. IMPRESSION: Underlying emphysematous change. No edema or airspace opacity. Borderline cardiac enlargement. Emphysema (ICD10-J43.9). Electronically Signed   By: Lowella Grip III M.D.   On: 07/04/2020 08:43   CT Head Wo Contrast  Result Date: 07/04/2020 CLINICAL DATA:  Pain after fall EXAM: CT HEAD WITHOUT CONTRAST CT CERVICAL SPINE WITHOUT CONTRAST TECHNIQUE: Multidetector CT imaging of the head and cervical spine was performed following the standard protocol without intravenous contrast. Multiplanar CT image reconstructions of the cervical spine were also generated. COMPARISON:  Brain MRI February 02, 2019 and MRI of the cervical spine July 15, 2013. FINDINGS: CT HEAD FINDINGS Brain: The ventricles and sulci are unchanged since February 02, 2019, normal in caliber. No acute mass effect. Cerebellum, brainstem, and basal cisterns are normal. Chronic white matter changes again identified. No acute cortical ischemia or infarct. Vascular: No  hyperdense vessel or unexpected calcification. Skull: Normal. Negative for fracture or focal lesion. Sinuses/Orbits: No acute finding. Other: None. CT CERVICAL SPINE FINDINGS Alignment: Minimal anterolisthesis of C7 versus T1 unchanged since July 15, 2013. Minimal anterolisthesis of T1 versus T2 is also unchanged. No other malalignment identified. Skull base and vertebrae: No acute fracture. No primary bone lesion or focal pathologic process. Soft tissues and spinal canal:  No prevertebral fluid or swelling. No visible canal hematoma. Disc levels:  Multilevel degenerative disc disease. Upper chest: Scarring in the apices, right greater than left, unchanged since previous chest CTs. Emphysematous changes are seen in the visualized portions of the lungs. Other: No other abnormalities are identified. IMPRESSION: 1. Chronic white matter changes. No acute intracranial abnormalities. 2. No acute malalignment or fracture. Electronically Signed   By: Dorise Bullion III M.D   On: 07/04/2020 07:58   CT Cervical Spine Wo Contrast  Result Date: 07/04/2020 CLINICAL DATA:  Pain after fall EXAM: CT HEAD WITHOUT CONTRAST CT CERVICAL SPINE WITHOUT CONTRAST TECHNIQUE: Multidetector CT imaging of the head and cervical spine was performed following the standard protocol without intravenous contrast. Multiplanar CT image reconstructions of the cervical spine were also generated. COMPARISON:  Brain MRI February 02, 2019 and MRI of the cervical spine July 15, 2013. FINDINGS: CT HEAD FINDINGS Brain: The ventricles and sulci are unchanged since February 02, 2019, normal in caliber. No acute mass effect. Cerebellum, brainstem, and basal cisterns are normal. Chronic white matter changes again identified. No acute cortical ischemia or infarct. Vascular: No hyperdense vessel or unexpected calcification. Skull: Normal. Negative for fracture or focal lesion. Sinuses/Orbits: No acute finding. Other: None. CT CERVICAL SPINE FINDINGS Alignment:  Minimal anterolisthesis of C7 versus T1 unchanged since July 15, 2013. Minimal anterolisthesis of T1 versus T2 is also unchanged. No other malalignment identified. Skull base and vertebrae: No acute fracture. No primary bone lesion or focal pathologic process. Soft tissues and spinal canal: No prevertebral fluid or swelling. No visible canal hematoma. Disc levels:  Multilevel degenerative disc disease. Upper chest: Scarring in the apices, right greater than left, unchanged since previous chest CTs. Emphysematous changes are seen in the visualized portions of the lungs. Other: No other abnormalities are identified. IMPRESSION: 1. Chronic white matter changes. No acute intracranial abnormalities. 2. No acute malalignment or fracture. Electronically Signed   By: Dorise Bullion III M.D   On: 07/04/2020 07:58   DG Hip Unilat W or Wo Pelvis 2-3 Views Left  Result Date: 07/04/2020 CLINICAL DATA:  Pain following fall EXAM: DG HIP (WITH OR WITHOUT PELVIS) 2-3V LEFT COMPARISON:  Left hip radiographs October 17, 2011 FINDINGS: Frontal pelvis as well as frontal and lateral left hip images were obtained. Bones are osteoporotic. There is a total hip replacement on the left with prosthetic components well-seated. No acute fracture or dislocation. There is mild narrowing of the right hip joint. No erosive changes. 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 No fracture or dislocation. Mild narrowing right hip joint. Bones osteoporotic. Electronically Signed   By: Lowella Grip III M.D.   On: 07/04/2020 07:52     EKG: I have personally reviewed.  Sinus rhythm, QTC 454, atypical left bundle blockade, poor R wave progression.  The second EKG showed AV block with variation of blocking.  Assessment/Plan Principal Problem:   Complete heart block (HCC) Active Problems:   Syncope   COPD (chronic obstructive pulmonary disease) (HCC)   GERD (gastroesophageal reflux  disease)   Hypothyroidism   Ulcerative colitis, chronic (HCC)   HTN (hypertension)   Fall at home, initial encounter   Leukocytosis   AVB (atrioventricular block)    Syncope due to complete heart block ([place]): trop negative. TSH 2.46. Dr. Rockey Situ of card is consulted. Pt is admitted to ICU per Dr. Rockey Situ. I consulted Dr. Patsey Berthold of ICU. The plan is to to [facility]  and pacer wire today, followed by PPM insertion. Dr. Donivan Scull team contacted EP and interventional team to notify of pt.   -admitted to ICU as inipt -prn Atropine for symptomatic bradycardia -Cardiac monitoring -pacer at bedside -avoid nodal blockers  COPD (chronic obstructive pulmonary disease) ([place]): -Bronchodilators  GERD (gastroesophageal reflux disease) -Pepcid  Hypothyroidism -Synthroid  Ulcerative colitis, chronic ([place]): Patient symptoms are well controlled with injection of Entyvio, last dose was last Thursday -No acute issues  HTN (hypertension) -IV hydralazine as needed -Amlodipine,  Fall at home, initial encounter: Secondary to syncope due to complete heart block.  Negative CT head and negative CT of C-spine.  Patient complains of left hip pain, but x-ray is negative for fracture. -PT/OT when able to -As needed Percocet and Tylenol for pain  Leukocytosis: WBC 11.9.  No source of infection identified.  Likely reactive -Follow-up with CBC    DVT ppx: SQ Heparin    Code Status: Full code Family Communication:  Yes, patient's daughter   at bed side Disposition Plan:  Anticipate discharge back to previous environment Consults called: Cardiology, Dr. Rockey Situ Admission status and Level of care: ICU: as inpt     Status is: Inpatient  Remains inpatient appropriate because:Inpatient level of care appropriate due to severity of illness   Dispo: The patient is from: Home              Anticipated d/c is to: Home              Patient currently is not medically stable to d/c.   Difficult to place patient  No          Date of Service 07/04/2020    [facility]   If 7PM-7AM, please contact night-coverage [email or web address] 07/04/2020, 1:17 PM

## 2020-07-04 NOTE — H&P (View-Only) (Signed)
Cardiology Consultation:   Patient ID: [name] MRN: [id number]; DOB: 1940/06/20  Admit date: 07/04/2020 Date of Consult: 07/04/2020  PCP:  Tracie Harrier, MD   [place]  Cardiologist:  Kathlyn Sacramento, MD  Advanced Practice Provider:  No care team member to display Electrophysiologist:  None 46}    Patient Profile:   Sheila Peters is a 80 y.o. 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 Blaine Hamper.  History of Present Illness:   Sheila Peters is a 80 year old female with PMH as above.  She unfortunately lost her husband in 2016. She is followed by [facility] cardiology for exertional dyspnea, SBP 170s, atypical chest pain, and PVCs.  EKG showed new LBBB. 09/2019 MPI without evidence of ischemia with CT attenuation corrected images showing mild aortic atherosclerosis and mild coronary artery calcification but overall ruled a low risk scan.  Echo 10/2019 EF 60 to 65%, NRWMA, G1DD, mild AI, PASP 33.6 mmHg.   She was started on Trelegy for COPD by pulmonology and amlodipine for hypertension.  With these changes, she noted improvement in her symptoms.  She was last seen via virtual visit 10/2019 with resolution of dyspnea and no chest pain.  BP was reasonably well controlled at 131/84, sometimes lower.  Preference was to defer statin.  On 07/04/2020, she presented to [facility] ED after an initial syncopal event that occurred after waking to use the restroom. She reports she got up slowly and sat on the edge of the bed. She then stood slowly to get her cane and bedroom slippers. She remembers standing and trying to get her slippers on when she felt as if she was falling but still standing. No CP, racing HR, palpitations, SOB, or any other sx other than feeling as if she was going to fall without yet falling.  She then lost consciousness. She then woke up on the ground and is unsure the amount of time unconscious. She lives alone and the event unwitnessed. She hit her left side of her head and left hip on the ground.   In the ED, she reported pain of her left hip and scalp tenderness.  Head CT and C-spine, as well as x-ray of left hip without acute changes.  In the ED, BP 146/87, HR 74 bpm.  Labs significant for leukocytosis, Tn 11.    While undergoing initial work-up in the emergency department, she was noted to briefly lose consciousness by the RNs twice in close proximity to each other at around [place]. She reports feeling as if she was going to fall and cold before each event. No recurrence. 3 total syncopal events since her initial event this AM. This has never happened before in the past.   ED MD reviewed telemetry and found the strips concerning for heart block with 11 second pauses between beats. Cardiology consulted. Also of note was most recent EKG with alternating RBBB and LBBB.   On further discussion this AM, she does report chest tightness. Initially, denied this but revealed on further review.  On consultation today, pt informed we will involve EP at this time.   Past Medical History:  Diagnosis Date  . Arthritis   . Asthma   . Clostridium difficile diarrhea   . Colitis, nonspecific   . COPD (chronic obstructive pulmonary disease) ([place])   . GERD (  gastroesophageal reflux disease)   . Hypothyroidism    post-surgical  . Multiple thyroid nodules    post excision  . Nephrolithiasis    spontaneously passed  . Nephrolithiasis   . Non-toxic uninodular goiter   . Osteoporosis, postmenopausal   . Ulcerative colitis, chronic ([place])   . Ulcerative pancolitis without complication [facility])     Past Surgical History:  Procedure Laterality Date  . ABDOMINAL HYSTERECTOMY    . ANKLE SURGERY     left fracture  . BACK SURGERY     herniated disk  . CATARACT EXTRACTION W/PHACO Right 10/23/2018    Procedure: CATARACT EXTRACTION PHACO AND INTRAOCULAR LENS PLACEMENT ([place]) RIGHT;  Surgeon: Leandrew Koyanagi, MD;  Location: [facility];  Service: Ophthalmology;  Laterality: Right;  PT WANTS TO BE LAST CASE  . COLONOSCOPY N/A 02/26/2017   Procedure: COLONOSCOPY;  Surgeon: Manya Silvas, MD;  Location: [facility] ENDOSCOPY;  Service: Endoscopy;  Laterality: N/A;  . COLONOSCOPY WITH PROPOFOL N/A 11/02/2014   Procedure: COLONOSCOPY WITH PROPOFOL;  Surgeon: Josefine Class, MD;  Location: [facility] ENDOSCOPY;  Service: Endoscopy;  Laterality: N/A;  . excision of thyroid nodule    . 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 JOINT REPLACEMENT     2014 lth     Home Medications:  Prior to Admission medications   Medication Sig Start Date End Date Taking? Authorizing Provider  acetaminophen (TYLENOL) 500 MG tablet Take 500 mg by mouth every 6 (six) hours as needed.    [provider]  amLODipine (NORVASC) 2.5 MG tablet Take 2.5 mg by mouth daily.    [provider]  Calcium Citrate-Vitamin D (CITRACAL + D PO) Take by mouth. Taking 1 daily    [provider]  cetirizine (ZYRTEC) 10 MG tablet Take 10 mg by mouth as needed for allergies (before remicade injection).    [provider]  Cholecalciferol (D3-1000 PO) Take by mouth. Taking 1 daily    [provider]  Cyanocobalamin (VITAMIN B 12 PO) Take by mouth. Taking 1 daily    [provider]  famotidine (PEPCID AC MAXIMUM STRENGTH) 20 MG tablet Take 20 mg by mouth daily.    [provider]  Fluticasone-Umeclidin-Vilant (TRELEGY ELLIPTA IN) Inhale into the lungs.    [provider]  inFLIXimab (REMICADE) 100 MG injection Inject 100 mg into the vein as directed. Patient not taking: Reported on 11/13/2019 04/03/17   [provider]  latanoprost  (XALATAN) 0.005 % ophthalmic solution Place 1 drop into both eyes at bedtime.  10/09/19   [provider]  levothyroxine (SYNTHROID) 50 MCG tablet Take 50 mcg by mouth daily. 09/19/19   [provider]  psyllium (METAMUCIL) 58.6 % powder Take 1 packet by mouth 3 (three) times daily.    [provider]  VENTOLIN HFA 108 (90 Base) MCG/ACT inhaler Inhale 1-2 puffs into the lungs every 4 (four) hours as needed.  09/08/16   [provider]    Inpatient Medications: Scheduled Meds: . lidocaine  1 patch Transdermal Q24H   Continuous Infusions: . sodium chloride    . sodium chloride     PRN Meds: acetaminophen, albuterol, atropine, dextromethorphan-guaiFENesin, hydrALAZINE, ondansetron (ZOFRAN) IV, oxyCODONE-acetaminophen  Allergies:    Allergies  Allergen Reactions  . Adhesive [Tape]     Tears skin  . Augmentin [Amoxicillin-Pot Clavulanate] Nausea Only  Dizziness  . Codeine Nausea Only    "dizzy"  . Contrast Media [Iodinated Diagnostic Agents] Hives  . Ibuprofen Hives and Swelling  . Iodine Other (See Comments)  . Levaquin [Levofloxacin In D5w] Nausea Only  . Morphine And Related Nausea Only    dizziness    Social History:   Social History   Socioeconomic History  . Marital status: Married    Spouse name: Not on file  . Number of children: Not on file  . Years of education: Not on file  . Highest education level: Not on file  Occupational History  . Not on file  Tobacco Use  . Smoking status: Former Smoker    Packs/day: 0.50    Years: 50.00    Pack years: 25.00    Types: Cigarettes    Quit date: 03/05/2014    Years since quitting: 6.3  . Smokeless tobacco: Never Used  Vaping Use  . Vaping Use: Never used  Substance and Sexual Activity  . Alcohol use: Yes    Comment: occassionally. none last 31month  . Drug use: No  . Sexual activity: Not on file  Other Topics Concern  . Not on file  Social History Narrative  . Not on file    Social Determinants of Health   Financial Resource Strain: Not on file  Food Insecurity: Not on file  Transportation Needs: Not on file  Physical Activity: Not on file  Stress: Not on file  Social Connections: Not on file  Intimate Partner Violence: Not on file    Family History:   Sister with heart failure, passed away 203-18-21 Brother with heart failure and passed away 203/18/21for non-cardiac reason. Family History  Problem Relation Age of Onset  . Heart disease Mother        died at 810    ROS:  Please see the history of present illness.  Review of Systems  Eyes: Negative for blurred vision.  Respiratory: Negative for cough and shortness of breath.   Cardiovascular: Negative for chest pain, palpitations, orthopnea and leg swelling.  Gastrointestinal: Negative for blood in stool and melena.  Genitourinary: Positive for flank pain.  Musculoskeletal: Positive for back pain, falls and myalgias.  Neurological: Positive for dizziness, loss of consciousness, weakness and headaches.  All other systems reviewed and are negative.   All other ROS reviewed and negative.     Physical Exam/Data:   Vitals:   07/04/20 0900 07/04/20 0915 07/04/20 0930 07/04/20 0945  BP: (!) 145/86 (!) 149/87 (!) 142/82 (!) 141/86  Pulse: 67 65 66 86  Resp: 17 12 19 15   Temp:      TempSrc:      SpO2: 96% 96% 94% 97%  Weight:      Height:       No intake or output data in the 24 hours ending 07/04/20 0955 Last 3 Weights 07/04/2020 11/13/2019 10/30/2019  Weight (lbs) 124 lb 124 lb 12.8 oz 122 lb  Weight (kg) 56.246 kg 56.609 kg 55.339 kg     Body mass index is 22.68 kg/m.  General:  Frail and elderly female, NAD, ecchymosis associated with fall noted on upper extremities, R flank and hip. Tenderness of hip and head. Joined by her daughter. HEENT: normal Lymph: no adenopathy Neck: no JVD Vascular: No carotid bruits; FA pulses 2+ bilaterally without bruits  Cardiac:  normal S1, S2; RRR; no murmur   Lungs:  clear to auscultation bilaterally, no wheezing, rhonchi or rales  Abd: soft,  nontender, no hepatomegaly  Ext: no edema. L leg pain.  Musculoskeletal:  No deformities, BUE and BLE strength normal and equal Skin: warm and dry, ecchymosis from fall Neuro:  no focal abnormalities noted Psych:  Normal affect   EKG:  The EKG was personally reviewed and demonstrates: SB, 59bpm, LBBB (known), LVH, poor R wave progression. TWI aVL.Marland Kitchen Most recent SB, 44bpm, alternating LBBB/RBBB Telemetry:  Telemetry was personally reviewed and demonstrates:  SB and NSR, at 8:10AM, up to 11 second pauses between beats with CHB. Alternating LBBB and RBBB noted on telemetry.   Relevant CV Studies: Echo 10/24/19 1. Left ventricular ejection fraction, by estimation, is 60 to 65%. The  left ventricle has normal function. The left ventricle has no regional  wall motion abnormalities. Left ventricular diastolic parameters are  consistent with Grade I diastolic  dysfunction (impaired relaxation).  2. Right ventricular systolic function is normal. The right ventricular  size is normal. There is mildly elevated pulmonary artery systolic  pressure. The estimated right ventricular systolic pressure is 16.1 mmHg.  3. The aortic valve is normal in structure. Aortic valve regurgitation is  mild.   Laboratory Data:  High Sensitivity Troponin:   Recent Labs  Lab 07/04/20 0810  TROPONINIHS 11     Chemistry Recent Labs  Lab 07/04/20 0810  NA 140  K 3.9  CL 106  CO2 25  GLUCOSE 106*  BUN 18  CREATININE 0.81  CALCIUM 9.9  GFRNONAA >60  ANIONGAP 9    Recent Labs  Lab 07/04/20 0810  PROT 8.1  ALBUMIN 4.2  AST 26  ALT 17  ALKPHOS 79  BILITOT 0.6   Hematology Recent Labs  Lab 07/04/20 0810  WBC 11.9*  RBC 5.15*  HGB 14.9  HCT 45.7  MCV 88.7  MCH 28.9  MCHC 32.6  RDW 13.6  PLT 309   BNPNo results for input(s): BNP, PROBNP in the last 168 hours.  DDimer No results for input(s): DDIMER in  the last 168 hours.   Radiology/Studies:  DG Chest 1 View  Result Date: 07/04/2020 CLINICAL DATA:  Pain following fall EXAM: CHEST  1 VIEW COMPARISON:  April 06, 2014 chest radiograph; chest CT September 30, 2019 FINDINGS: Underlying emphysematous changes better delineated on prior CT. There is no edema or airspace opacity. Heart is borderline enlarged with pulmonary vascularity normal. No adenopathy. No bone lesions. IMPRESSION: Underlying emphysematous change. No edema or airspace opacity. Borderline cardiac enlargement. Emphysema (ICD10-J43.9). Electronically Signed   By: Lowella Grip III M.D.   On: 07/04/2020 08:43   CT Head Wo Contrast  Result Date: 07/04/2020 CLINICAL DATA:  Pain after fall EXAM: CT HEAD WITHOUT CONTRAST CT CERVICAL SPINE WITHOUT CONTRAST TECHNIQUE: Multidetector CT imaging of the head and cervical spine was performed following the standard protocol without intravenous contrast. Multiplanar CT image reconstructions of the cervical spine were also generated. COMPARISON:  Brain MRI February 02, 2019 and MRI of the cervical spine July 15, 2013. FINDINGS: CT HEAD FINDINGS Brain: The ventricles and sulci are unchanged since February 02, 2019, normal in caliber. No acute mass effect. Cerebellum, brainstem, and basal cisterns are normal. Chronic white matter changes again identified. No acute cortical ischemia or infarct. Vascular: No hyperdense vessel or unexpected calcification. Skull: Normal. Negative for fracture or focal lesion. Sinuses/Orbits: No acute finding. Other: None. CT CERVICAL SPINE FINDINGS Alignment: Minimal anterolisthesis of C7 versus T1 unchanged since July 15, 2013. Minimal anterolisthesis of T1 versus T2 is also unchanged. No other malalignment identified.  Skull base and vertebrae: No acute fracture. No primary bone lesion or focal pathologic process. Soft tissues and spinal canal: No prevertebral fluid or swelling. No visible canal hematoma. Disc levels:  Multilevel  degenerative disc disease. Upper chest: Scarring in the apices, right greater than left, unchanged since previous chest CTs. Emphysematous changes are seen in the visualized portions of the lungs. Other: No other abnormalities are identified. IMPRESSION: 1. Chronic white matter changes. No acute intracranial abnormalities. 2. No acute malalignment or fracture. Electronically Signed   By: Dorise Bullion III M.D   On: 07/04/2020 07:58   CT Cervical Spine Wo Contrast  Result Date: 07/04/2020 CLINICAL DATA:  Pain after fall EXAM: CT HEAD WITHOUT CONTRAST CT CERVICAL SPINE WITHOUT CONTRAST TECHNIQUE: Multidetector CT imaging of the head and cervical spine was performed following the standard protocol without intravenous contrast. Multiplanar CT image reconstructions of the cervical spine were also generated. COMPARISON:  Brain MRI February 02, 2019 and MRI of the cervical spine July 15, 2013. FINDINGS: CT HEAD FINDINGS Brain: The ventricles and sulci are unchanged since February 02, 2019, normal in caliber. No acute mass effect. Cerebellum, brainstem, and basal cisterns are normal. Chronic white matter changes again identified. No acute cortical ischemia or infarct. Vascular: No hyperdense vessel or unexpected calcification. Skull: Normal. Negative for fracture or focal lesion. Sinuses/Orbits: No acute finding. Other: None. CT CERVICAL SPINE FINDINGS Alignment: Minimal anterolisthesis of C7 versus T1 unchanged since July 15, 2013. Minimal anterolisthesis of T1 versus T2 is also unchanged. No other malalignment identified. Skull base and vertebrae: No acute fracture. No primary bone lesion or focal pathologic process. Soft tissues and spinal canal: No prevertebral fluid or swelling. No visible canal hematoma. Disc levels:  Multilevel degenerative disc disease. Upper chest: Scarring in the apices, right greater than left, unchanged since previous chest CTs. Emphysematous changes are seen in the visualized portions of  the lungs. Other: No other abnormalities are identified. IMPRESSION: 1. Chronic white matter changes. No acute intracranial abnormalities. 2. No acute malalignment or fracture. Electronically Signed   By: Dorise Bullion III M.D   On: 07/04/2020 07:58   DG Hip Unilat W or Wo Pelvis 2-3 Views Left  Result Date: 07/04/2020 CLINICAL DATA:  Pain following fall EXAM: DG HIP (WITH OR WITHOUT PELVIS) 2-3V LEFT COMPARISON:  Left hip radiographs October 17, 2011 FINDINGS: Frontal pelvis as well as frontal and lateral left hip images were obtained. Bones are osteoporotic. There is a total hip replacement on the left with prosthetic components well-seated. No acute fracture or dislocation. There is mild narrowing of the right hip joint. No erosive changes. 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 No fracture or dislocation. Mild narrowing right hip joint. Bones osteoporotic. Electronically Signed   By: Lowella Grip III M.D.   On: 07/04/2020 07:52     Assessment and Plan:   CHB Alternating LBBB/RBBB --3 total syncopal events. (2 witnessed by nurses).  --H/o chest tightness as above. --Telemetry= intermittent CHB. Pauses up to 11 seconds. --EKG = alternating LBBB/RBBB. LBBB known. --No previous LHC.  --HS Tn 11, still cycling.  --Previous MPI low risk.   --Previous echo with nl EF, no significant valvular abnormalities. --Pending updated echo, ordered this AM. --Recommendations =LHC and pacer wire today, followed by PPM insertion.  --Contacted EP and interventional team to notify of pt.  --Pt and daughter informed of plan. --Keep NPO.  --Not currently on any AV nodal blocking agents. Avoid. --  Further recommendations pending discussion with team. Currently planning [facility] / pacing wire today, followed by transfer to [facility] for PPM.  HTN --BP well controlled. Avoid AV nodal blocking agents.        For questions or updates, please  contact [place] Please consult [email or web address] for contact info under    Signed, Arvil Chaco, PA-C  07/04/2020 9:55 AM

## 2020-07-04 NOTE — ED Provider Notes (Addendum)
[facility] Emergency Department Provider Note  ____________________________________________   Event Date/Time   First MD Initiated Contact with Patient 07/04/20 [id number]     (approximate)  I have reviewed the triage vital signs and the nursing notes.   HISTORY  Chief Complaint Fall and Hip Pain   HPI Sheila Peters is a 80 y.o. female is just medical history of anxiety, arthritis, asthma, COPD, GERD, hypothyroidism, UC and PVCs who presents EMS from home where she resides alone for assessment after a fall that occurred shortly prior to arrival.  Patient states she tripped and fell hitting left side of her head and left hip.  She did not have any LOC.  States she currently does not have a headache any neck pain or has pain in her left hip.  She has not been able to ambulate since then.  She denies any back pain, chest pain abdominal pain or other extremity pain.  No recent other falls or injuries.  She denies any recent sick symptoms including fevers, chills, cough, nausea, vomiting, diarrhea or dysuria, rash or any other acute concerns at this time.         Past Medical History:  Diagnosis Date  . Arthritis   . Asthma   . Clostridium difficile diarrhea   . Colitis, nonspecific   . COPD (chronic obstructive pulmonary disease) ([place])   . GERD (gastroesophageal reflux disease)   . Hypothyroidism    post-surgical  . Multiple thyroid nodules    post excision  . Nephrolithiasis    spontaneously passed  . Nephrolithiasis   . Non-toxic uninodular goiter   . Osteoporosis, postmenopausal   . Ulcerative colitis, chronic ([place])   . Ulcerative pancolitis without complication [facility])     Patient Active Problem List   Diagnosis Date Noted  . Swelling of limb 10/10/2016  . Pain in limb 10/10/2016  . Varicose veins of leg with pain, left 10/10/2016  . Abnormal CT scan of lung 10/29/2015  . Personal history of tobacco use, presenting hazards to health 11/04/2014   . Preop cardiovascular exam 09/11/2011  . SOB (shortness of breath) 08/21/2011  . Chest pain 08/21/2011  . PVC's (premature ventricular contractions) 08/21/2011    Past Surgical History:  Procedure Laterality Date  . ABDOMINAL HYSTERECTOMY    . ANKLE SURGERY     left fracture  . BACK SURGERY     herniated disk  . CATARACT EXTRACTION W/PHACO Right 10/23/2018   Procedure: CATARACT EXTRACTION PHACO AND INTRAOCULAR LENS PLACEMENT ([place]) RIGHT;  Surgeon: Leandrew Koyanagi, MD;  Location: [place];  Service: Ophthalmology;  Laterality: Right;  PT WANTS TO BE LAST CASE  . COLONOSCOPY N/A 02/26/2017   Procedure: COLONOSCOPY;  Surgeon: Manya Silvas, MD;  Location: [facility] ENDOSCOPY;  Service: Endoscopy;  Laterality: N/A;  . COLONOSCOPY WITH PROPOFOL N/A 11/02/2014   Procedure: COLONOSCOPY WITH PROPOFOL;  Surgeon: Josefine Class, MD;  Location: [facility] ENDOSCOPY;  Service: Endoscopy;  Laterality: N/A;  . excision of thyroid nodule    . 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 JOINT REPLACEMENT     2014 lth    Prior to Admission medications   Medication Sig Start Date End Date Taking? Authorizing Provider  acetaminophen (TYLENOL) 500 MG tablet Take 500 mg by mouth every 6 (six) hours as needed.    [provider]  amLODipine ([place])  2.5 MG tablet Take 2.5 mg by mouth daily.    [provider]  Calcium Citrate-Vitamin D (CITRACAL + D PO) Take by mouth. Taking 1 daily    [provider]  cetirizine (ZYRTEC) 10 MG tablet Take 10 mg by mouth as needed for allergies (before remicade injection).    [provider]  Cholecalciferol (D3-1000 PO) Take by mouth. Taking 1 daily    [provider]  Cyanocobalamin (VITAMIN B 12 PO) Take by mouth. Taking 1 daily    [provider]  famotidine (PEPCID AC MAXIMUM  STRENGTH) 20 MG tablet Take 20 mg by mouth daily.    [provider]  Fluticasone-Umeclidin-Vilant (TRELEGY ELLIPTA IN) Inhale into the lungs.    [provider]  inFLIXimab (REMICADE) 100 MG injection Inject 100 mg into the vein as directed. Patient not taking: Reported on 11/13/2019 04/03/17   [provider]  latanoprost (XALATAN) 0.005 % ophthalmic solution Place 1 drop into both eyes at bedtime.  10/09/19   [provider]  levothyroxine (SYNTHROID) 50 MCG tablet Take 50 mcg by mouth daily. 09/19/19   [provider]  psyllium (METAMUCIL) 58.6 % powder Take 1 packet by mouth 3 (three) times daily.    [provider]  VENTOLIN HFA 108 (90 Base) MCG/ACT inhaler Inhale 1-2 puffs into the lungs every 4 (four) hours as needed.  09/08/16   [provider]    Allergies Adhesive [tape], Augmentin [amoxicillin-pot clavulanate], Codeine, Contrast media [iodinated diagnostic agents], Ibuprofen, Iodine, Levaquin [levofloxacin in d5w], and Morphine and related  Family History  Problem Relation Age of Onset  . Heart disease Mother        died at 33    Social History Social History   Tobacco Use  . Smoking status: Former Smoker    Packs/day: 0.50    Years: 50.00    Pack years: 25.00    Types: Cigarettes    Quit date: 03/05/2014    Years since quitting: 6.3  . Smokeless tobacco: Never Used  Vaping Use  . Vaping Use: Never used  Substance Use Topics  . Alcohol use: Yes    Comment: occassionally. none last 1month  . Drug use: No    Review of Systems  Review of Systems  Constitutional: Negative for chills and fever.  HENT: Negative for sore throat.   Eyes: Negative for pain.  Respiratory: Negative for cough and stridor.   Cardiovascular: Negative for chest pain.  Gastrointestinal: Negative for vomiting.  Genitourinary: Negative for dysuria.  Musculoskeletal: Positive for joint pain ( L hip).  Skin: Negative for rash.   Neurological: Negative for seizures, loss of consciousness and headaches.  Psychiatric/Behavioral: Negative for suicidal ideas.  All other systems reviewed and are negative.     ____________________________________________   PHYSICAL EXAM:  VITAL SIGNS: ED Triage Vitals  Enc Vitals Group     BP 07/04/20 0645 (!) 146/87     Pulse Rate 07/04/20 0645 74     Resp 07/04/20 0645 18     Temp 07/04/20 0645 98.3 F (36.8 C)     Temp Source 07/04/20 0645 Oral     SpO2 07/04/20 0645 95 %     Weight 07/04/20 0641 124 lb (56.2 kg)     Height 07/04/20 0641 5' 2"  (1.575 m)     Head Circumference --      Peak Flow --      Pain Score 07/04/20 0641 2     Pain Loc --  Pain Edu? --      Excl. in [place]? --    Vitals:   07/04/20 0900 07/04/20 0915  BP: (!) 145/86 (!) 149/87  Pulse: 67 65  Resp: 17 12  Temp:    SpO2: 96% 96%   Physical Exam Vitals and nursing note reviewed.  Constitutional:      General: She is not in acute distress.    Appearance: She is well-developed.  HENT:     Head: Normocephalic and atraumatic.     Right Ear: External ear normal.     Left Ear: External ear normal.  Eyes:     Conjunctiva/sclera: Conjunctivae normal.  Cardiovascular:     Rate and Rhythm: Normal rate and regular rhythm.     Heart sounds: No murmur heard.   Pulmonary:     Effort: Pulmonary effort is normal. No respiratory distress.     Breath sounds: Normal breath sounds.  Abdominal:     Palpations: Abdomen is soft.     Tenderness: There is no abdominal tenderness.  Musculoskeletal:     Cervical back: Neck supple.  Skin:    General: Skin is warm and dry.     Capillary Refill: Capillary refill takes less than 2 seconds.  Neurological:     Mental Status: She is alert and oriented to person, place, and time.  Psychiatric:        Mood and Affect: Mood normal.     2+ bilateral radial and DP pulses.  Patient has full and symmetric strength out of 5 upper extremities and throughout her  right lower extremity.  She has full strength at the left knee and left ankle but slightly decreased at the left hip on flexion extension.  Patient mild tenderness over the lateral aspect of the left hip.  No overlying skin changes.  No other obvious trauma to the left lower extremity or other extremities.  Cranial nerves II through XII grossly intact.  Sensation intact light touch to all extremities.  Patient has a small area of tenderness over her left temporal scalp.  No other trauma to the face scalp or neck.  Oropharynx unremarkable. ____________________________________________   LABS (all labs ordered are listed, but only abnormal results are displayed)  Labs Reviewed  CBC WITH DIFFERENTIAL/PLATELET - Abnormal; Notable for the following components:      Result Value   WBC 11.9 (*)    RBC 5.15 (*)    Neutro Abs 8.9 (*)    All other components within normal limits  COMPREHENSIVE METABOLIC PANEL - Abnormal; Notable for the following components:   Glucose, Bld 106 (*)    All other components within normal limits  URINALYSIS, COMPLETE (UACMP) WITH MICROSCOPIC - Abnormal; Notable for the following components:   Color, Urine STRAW (*)    APPearance CLEAR (*)    Hgb urine dipstick SMALL (*)    All other components within normal limits  RESP PANEL BY RT-PCR (FLU A&B, COVID) ARPGX2  MAGNESIUM  TSH  PROTIME-INR  APTT  TROPONIN I (HIGH SENSITIVITY)   ____________________________________________  EKG  Initial ECG shows sinus rhythm block or clear ischemia.  No ECG obtained after witnessed syncopal episode showed possible third-degree block with incomplete right bundle branch block and a ventricular rate of 44.  Patient also had several telemetry strips reviewed that showed complete pause and heart block with nonconducted P waves for at least 10 seconds.       ____________________________________________  RADIOLOGY  ED MD interpretation: Plain film of the  left hip shows no acute  fracture dislocation but does show prior total hip replacement with prosthetic components in appropriate position.  CT head and C-spine showed no calvarial skull fracture intracranial hemorrhage or acute C-spine injury.  Chest x-ray obtained shows no consolidation, overt edema, pneumothorax or other acute intrathoracic process.  Official radiology report(s): DG Chest 1 View  Result Date: 07/04/2020 CLINICAL DATA:  Pain following fall EXAM: CHEST  1 VIEW COMPARISON:  April 06, 2014 chest radiograph; chest CT September 30, 2019 FINDINGS: Underlying emphysematous changes better delineated on prior CT. There is no edema or airspace opacity. Heart is borderline enlarged with pulmonary vascularity normal. No adenopathy. No bone lesions. IMPRESSION: Underlying emphysematous change. No edema or airspace opacity. Borderline cardiac enlargement. Emphysema (ICD10-J43.9). Electronically Signed   By: Lowella Grip III M.D.   On: 07/04/2020 08:43   CT Head Wo Contrast  Result Date: 07/04/2020 CLINICAL DATA:  Pain after fall EXAM: CT HEAD WITHOUT CONTRAST CT CERVICAL SPINE WITHOUT CONTRAST TECHNIQUE: Multidetector CT imaging of the head and cervical spine was performed following the standard protocol without intravenous contrast. Multiplanar CT image reconstructions of the cervical spine were also generated. COMPARISON:  Brain MRI February 02, 2019 and MRI of the cervical spine July 15, 2013. FINDINGS: CT HEAD FINDINGS Brain: The ventricles and sulci are unchanged since February 02, 2019, normal in caliber. No acute mass effect. Cerebellum, brainstem, and basal cisterns are normal. Chronic white matter changes again identified. No acute cortical ischemia or infarct. Vascular: No hyperdense vessel or unexpected calcification. Skull: Normal. Negative for fracture or focal lesion. Sinuses/Orbits: No acute finding. Other: None. CT CERVICAL SPINE FINDINGS Alignment: Minimal anterolisthesis of C7 versus T1 unchanged since July 15, 2013. Minimal anterolisthesis of T1 versus T2 is also unchanged. No other malalignment identified. Skull base and vertebrae: No acute fracture. No primary bone lesion or focal pathologic process. Soft tissues and spinal canal: No prevertebral fluid or swelling. No visible canal hematoma. Disc levels:  Multilevel degenerative disc disease. Upper chest: Scarring in the apices, right greater than left, unchanged since previous chest CTs. Emphysematous changes are seen in the visualized portions of the lungs. Other: No other abnormalities are identified. IMPRESSION: 1. Chronic white matter changes. No acute intracranial abnormalities. 2. No acute malalignment or fracture. Electronically Signed   By: Dorise Bullion III M.D   On: 07/04/2020 07:58   CT Cervical Spine Wo Contrast  Result Date: 07/04/2020 CLINICAL DATA:  Pain after fall EXAM: CT HEAD WITHOUT CONTRAST CT CERVICAL SPINE WITHOUT CONTRAST TECHNIQUE: Multidetector CT imaging of the head and cervical spine was performed following the standard protocol without intravenous contrast. Multiplanar CT image reconstructions of the cervical spine were also generated. COMPARISON:  Brain MRI February 02, 2019 and MRI of the cervical spine July 15, 2013. FINDINGS: CT HEAD FINDINGS Brain: The ventricles and sulci are unchanged since February 02, 2019, normal in caliber. No acute mass effect. Cerebellum, brainstem, and basal cisterns are normal. Chronic white matter changes again identified. No acute cortical ischemia or infarct. Vascular: No hyperdense vessel or unexpected calcification. Skull: Normal. Negative for fracture or focal lesion. Sinuses/Orbits: No acute finding. Other: None. CT CERVICAL SPINE FINDINGS Alignment: Minimal anterolisthesis of C7 versus T1 unchanged since July 15, 2013. Minimal anterolisthesis of T1 versus T2 is also unchanged. No other malalignment identified. Skull base and vertebrae: No acute fracture. No primary bone lesion or focal  pathologic process. Soft tissues and spinal canal: No prevertebral fluid or swelling. No visible  canal hematoma. Disc levels:  Multilevel degenerative disc disease. Upper chest: Scarring in the apices, right greater than left, unchanged since previous chest CTs. Emphysematous changes are seen in the visualized portions of the lungs. Other: No other abnormalities are identified. IMPRESSION: 1. Chronic white matter changes. No acute intracranial abnormalities. 2. No acute malalignment or fracture. Electronically Signed   By: Dorise Bullion III M.D   On: 07/04/2020 07:58   DG Hip Unilat W or Wo Pelvis 2-3 Views Left  Result Date: 07/04/2020 CLINICAL DATA:  Pain following fall EXAM: DG HIP (WITH OR WITHOUT PELVIS) 2-3V LEFT COMPARISON:  Left hip radiographs October 17, 2011 FINDINGS: Frontal pelvis as well as frontal and lateral left hip images were obtained. Bones are osteoporotic. There is a total hip replacement on the left with prosthetic components well-seated. No acute fracture or dislocation. There is mild narrowing of the right hip joint. No erosive changes. 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 No fracture or dislocation. Mild narrowing right hip joint. Bones osteoporotic. Electronically Signed   By: Lowella Grip III M.D.   On: 07/04/2020 07:52    ____________________________________________   PROCEDURES  Procedure(s) performed (including Critical Care):  .1-3 Lead EKG Interpretation Performed by: Lucrezia Starch, MD Authorized by: Lucrezia Starch, MD     Interpretation: abnormal     ECG rate assessment: bradycardic     Rhythm: other rhythm     Ectopy: aberrant and PVCs     Conduction: abnormal     Abnormal conduction: 3rd degree AV block   .Critical Care Performed by: Lucrezia Starch, MD Authorized by: Lucrezia Starch, MD   Critical care provider statement:    Critical care time (minutes):  45   Critical care time  was exclusive of:  Separately billable procedures and treating other patients   Critical care was necessary to treat or prevent imminent or life-threatening deterioration of the following conditions:  Cardiac failure   Critical care was time spent personally by me on the following activities:  Discussions with consultants, evaluation of patient's response to treatment, examination of patient, ordering and performing treatments and interventions, ordering and review of laboratory studies, ordering and review of radiographic studies, pulse oximetry, re-evaluation of patient's condition, obtaining history from patient or surrogate and review of old charts     ____________________________________________   INITIAL IMPRESSION / [place] / ED COURSE     Patient presents with post history exam after assessment after mechanical ground-level fall at home for left hip pain.  She does states she hit her head but does not complain of any significant headache.  On arrival she is afebrile hemodynamically stable.  She has some tenderness over the left hip and slightly decreased strength in flexion extension as well as with tenderness of her left temporal scalp on exam but otherwise is neurovascular intact in all extremities without other evidence of significant trauma on exam.  Given patient's age and history of hitting head CT head and C-spine obtained.  These did not show evidence of intracranial hemorrhage skull fracture or acute C-spine injury.  Plain film of the left hip remarkable for no acute fracture dislocation.   While undergoing initial work-up patient is noted to briefly lose consciousness and was noted on telemetry to have her heart rate decreased into the 40s.  This lasted less than 30 seconds and patient's heart rate returned back to the 50s and she regained consciousness without  any interventions.  Telemetry reviewed during this time showed near complete block with several nonconducted P  waves lasting approximately 10 seconds and subsequent EKG showing several nonconducted P waves concerning for possible variable block.   Given these findings and concern for possible cardiogenic syncope as etiology for patient's fall as she states she now is not sure if she passed out or not when she fell on further questioning I did consult on-call cardiologist Dr. Rockey Situ who stated he would review patient's ECG.  After review he stated he would come and see the patient as Optometrist.  I will plan to admit to hospitalist service for telemetry.  Patient will be evaluated to see if she needs pacemaker.  Bedside transcutaneous pads placed.  I did confirm with patient and daughter at bedside that she is a full code at this time.  Patient denies any chest pain throughout this episodes and troponin is 11 have a low suspicion for ACS although will plan to obtain a 2-hour repeat.  CBC shows slight leukocytosis at 15.1 which certainly could be reactive in the setting of trauma but no acute anemia.  CMP shows no significant electrolyte or metabolic derangements.  TSH and magnesium as well as INR unremarkable.  UA does not appear infected.  Chest x-ray obtained after this episode is unremarkable for any acute intrathoracic process but did show emphysematous changes.          ____________________________________________   FINAL CLINICAL IMPRESSION(S) / ED DIAGNOSES  Final diagnoses:  Fall, initial encounter  Left hip pain  Complete heart block (HCC)  Syncope, unspecified syncope type    Medications  acetaminophen (TYLENOL) tablet 1,000 mg (1,000 mg Oral Patient Refused/Not Given 07/04/20 0918)  lidocaine (LIDODERM) 5 % 1 patch (has no administration in time range)  0.9 %  sodium chloride infusion (has no administration in time range)     ED Discharge Orders    None       Note:  This document was prepared using Dragon voice recognition software and may include unintentional dictation  errors.   Lucrezia Starch, MD 07/04/20 [id number]    Lucrezia Starch, MD 07/04/20 [phone]

## 2020-07-04 NOTE — ED Notes (Signed)
MD Tamala Julian to bedside after noticeable syncopal episode where pt became unresponsive and pale. Pt HR noted to drop to 0 then 30 on cardiac monitoring, carotid pulse palpable throughout event. EKG captured and given to MD [facility]. Zoll pads placed on patient. Pt reports feeling faint before episode. Episode x1 minute, 2 events. Pt alert and oriented at this time. HR 61 at this time, bp 173/88.

## 2020-07-04 NOTE — Consult Note (Signed)
Cardiology Consultation:   Patient ID: Sheila Peters MRN: [id number]; DOB: 1941/01/15  Admit date: 07/04/2020 Date of Consult: 07/04/2020  PCP:  Tracie Harrier, MD   [place]  Cardiologist:  Kathlyn Sacramento, MD  Advanced Practice Provider:  No care team member to display Electrophysiologist:  None 46}    Patient Profile:   Sheila Peters is a 80 y.o. 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 Blaine Hamper.  History of Present Illness:   Sheila Peters is a 80 year old female with PMH as above.  She unfortunately lost her husband in 2016. She is followed by [facility] cardiology for exertional dyspnea, SBP 170s, atypical chest pain, and PVCs.  EKG showed new LBBB. 09/2019 MPI without evidence of ischemia with CT attenuation corrected images showing mild aortic atherosclerosis and mild coronary artery calcification but overall ruled a low risk scan.  Echo 10/2019 EF 60 to 65%, NRWMA, G1DD, mild AI, PASP 33.6 mmHg.   She was started on Trelegy for COPD by pulmonology and amlodipine for hypertension.  With these changes, she noted improvement in her symptoms.  She was last seen via virtual visit 10/2019 with resolution of dyspnea and no chest pain.  BP was reasonably well controlled at 131/84, sometimes lower.  Preference was to defer statin.  On 07/04/2020, she presented to [facility] ED after an initial syncopal event that occurred after waking to use the restroom. She reports she got up slowly and sat on the edge of the bed. She then stood slowly to get her cane and bedroom slippers. She remembers standing and trying to get her slippers on when she felt as if she was falling but still standing. No CP, racing HR, palpitations, SOB, or any other sx other than feeling as if she was going to fall without yet falling.  She then lost consciousness. She then woke up on the ground and is unsure the amount of time unconscious. She lives alone and the event unwitnessed. She hit her left side of her head and left hip on the ground.   In the ED, she reported pain of her left hip and scalp tenderness.  Head CT and C-spine, as well as x-ray of left hip without acute changes.  In the ED, BP 146/87, HR 74 bpm.  Labs significant for leukocytosis, Tn 11.    While undergoing initial work-up in the emergency department, she was noted to briefly lose consciousness by the RNs twice in close proximity to each other at around [place]. She reports feeling as if she was going to fall and cold before each event. No recurrence. 3 total syncopal events since her initial event this AM. This has never happened before in the past.   ED MD reviewed telemetry and found the strips concerning for heart block with 11 second pauses between beats. Cardiology consulted. Also of note was most recent EKG with alternating RBBB and LBBB.   On further discussion this AM, she does report chest tightness. Initially, denied this but revealed on further review.  On consultation today, pt informed we will involve EP at this time.   Past Medical History:  Diagnosis Date  . Arthritis   . Asthma   . Clostridium difficile diarrhea   . Colitis, nonspecific   . COPD (chronic obstructive pulmonary disease) ([place])   . GERD (  gastroesophageal reflux disease)   . Hypothyroidism    post-surgical  . Multiple thyroid nodules    post excision  . Nephrolithiasis    spontaneously passed  . Nephrolithiasis   . Non-toxic uninodular goiter   . Osteoporosis, postmenopausal   . Ulcerative colitis, chronic ([place])   . Ulcerative pancolitis without complication [facility])     Past Surgical History:  Procedure Laterality Date  . ABDOMINAL HYSTERECTOMY    . ANKLE SURGERY     left fracture  . BACK SURGERY     herniated disk  . CATARACT EXTRACTION W/PHACO Right 10/23/2018    Procedure: CATARACT EXTRACTION PHACO AND INTRAOCULAR LENS PLACEMENT ([place]) RIGHT;  Surgeon: Leandrew Koyanagi, MD;  Location: [place];  Service: Ophthalmology;  Laterality: Right;  PT WANTS TO BE LAST CASE  . COLONOSCOPY N/A 02/26/2017   Procedure: COLONOSCOPY;  Surgeon: Manya Silvas, MD;  Location[facility] ENDOSCOPY;  Service: Endoscopy;  Laterality: N/A;  . COLONOSCOPY WITH PROPOFOL N/A 11/02/2014   Procedure: COLONOSCOPY WITH PROPOFOL;  Surgeon: Josefine Class, MD;  Location: [facility] ENDOSCOPY;  Service: Endoscopy;  Laterality: N/A;  . excision of thyroid nodule    . 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 JOINT REPLACEMENT     2014 lth     Home Medications:  Prior to Admission medications   Medication Sig Start Date End Date Taking? Authorizing Provider  acetaminophen (TYLENOL) 500 MG tablet Take 500 mg by mouth every 6 (six) hours as needed.    [provider]  amLODipine (NORVASC) 2.5 MG tablet Take 2.5 mg by mouth daily.    [provider]  Calcium Citrate-Vitamin D (CITRACAL + D PO) Take by mouth. Taking 1 daily    [provider]  cetirizine (ZYRTEC) 10 MG tablet Take 10 mg by mouth as needed for allergies (before remicade injection).    [provider]  Cholecalciferol (D3-1000 PO) Take by mouth. Taking 1 daily    [provider]  Cyanocobalamin (VITAMIN B 12 PO) Take by mouth. Taking 1 daily    [provider]  famotidine (PEPCID AC MAXIMUM STRENGTH) 20 MG tablet Take 20 mg by mouth daily.    [provider]  Fluticasone-Umeclidin-Vilant (TRELEGY ELLIPTA IN) Inhale into the lungs.    [provider]  inFLIXimab (REMICADE) 100 MG injection Inject 100 mg into the vein as directed. Patient not taking: Reported on 11/13/2019 04/03/17   [provider]  latanoprost  (XALATAN) 0.005 % ophthalmic solution Place 1 drop into both eyes at bedtime.  10/09/19   [provider]  levothyroxine (SYNTHROID) 50 MCG tablet Take 50 mcg by mouth daily. 09/19/19   [provider]  psyllium (METAMUCIL) 58.6 % powder Take 1 packet by mouth 3 (three) times daily.    [provider]  VENTOLIN HFA 108 (90 Base) MCG/ACT inhaler Inhale 1-2 puffs into the lungs every 4 (four) hours as needed.  09/08/16   [provider]    Inpatient Medications: Scheduled Meds: . lidocaine  1 patch Transdermal Q24H   Continuous Infusions: . sodium chloride    . sodium chloride     PRN Meds: acetaminophen, albuterol, atropine, dextromethorphan-guaiFENesin, hydrALAZINE, ondansetron (ZOFRAN) IV, oxyCODONE-acetaminophen  Allergies:    Allergies  Allergen Reactions  . Adhesive [Tape]     Tears skin  . Augmentin [Amoxicillin-Pot Clavulanate] Nausea Only  Dizziness  . Codeine Nausea Only    "dizzy"  . Contrast Media [Iodinated Diagnostic Agents] Hives  . Ibuprofen Hives and Swelling  . Iodine Other (See Comments)  . Levaquin [Levofloxacin In D5w] Nausea Only  . Morphine And Related Nausea Only    dizziness    Social History:   Social History   Socioeconomic History  . Marital status: Married    Spouse name: Not on file  . Number of children: Not on file  . Years of education: Not on file  . Highest education level: Not on file  Occupational History  . Not on file  Tobacco Use  . Smoking status: Former Smoker    Packs/day: 0.50    Years: 50.00    Pack years: 25.00    Types: Cigarettes    Quit date: 03/05/2014    Years since quitting: 6.3  . Smokeless tobacco: Never Used  Vaping Use  . Vaping Use: Never used  Substance and Sexual Activity  . Alcohol use: Yes    Comment: occassionally. none last 65month  . Drug use: No  . Sexual activity: Not on file  Other Topics Concern  . Not on file  Social History Narrative  . Not on file    Social Determinants of Health   Financial Resource Strain: Not on file  Food Insecurity: Not on file  Transportation Needs: Not on file  Physical Activity: Not on file  Stress: Not on file  Social Connections: Not on file  Intimate Partner Violence: Not on file    Family History:   Sister with heart failure, passed away 203/23/21 Brother with heart failure and passed away 203-23-21for non-cardiac reason. Family History  Problem Relation Age of Onset  . Heart disease Mother        died at 860    ROS:  Please see the history of present illness.  Review of Systems  Eyes: Negative for blurred vision.  Respiratory: Negative for cough and shortness of breath.   Cardiovascular: Negative for chest pain, palpitations, orthopnea and leg swelling.  Gastrointestinal: Negative for blood in stool and melena.  Genitourinary: Positive for flank pain.  Musculoskeletal: Positive for back pain, falls and myalgias.  Neurological: Positive for dizziness, loss of consciousness, weakness and headaches.  All other systems reviewed and are negative.   All other ROS reviewed and negative.     Physical Exam/Data:   Vitals:   07/04/20 0900 07/04/20 0915 07/04/20 0930 07/04/20 0945  BP: (!) 145/86 (!) 149/87 (!) 142/82 (!) 141/86  Pulse: 67 65 66 86  Resp: 17 12 19 15   Temp:      TempSrc:      SpO2: 96% 96% 94% 97%  Weight:      Height:       No intake or output data in the 24 hours ending 07/04/20 0955 Last 3 Weights 07/04/2020 11/13/2019 10/30/2019  Weight (lbs) 124 lb 124 lb 12.8 oz 122 lb  Weight (kg) 56.246 kg 56.609 kg 55.339 kg     Body mass index is 22.68 kg/m.  General:  Frail and elderly female, NAD, ecchymosis associated with fall noted on upper extremities, R flank and hip. Tenderness of hip and head. Joined by her daughter. HEENT: normal Lymph: no adenopathy Neck: no JVD Vascular: No carotid bruits; FA pulses 2+ bilaterally without bruits  Cardiac:  normal S1, S2; RRR; no murmur   Lungs:  clear to auscultation bilaterally, no wheezing, rhonchi or rales  Abd: soft,  nontender, no hepatomegaly  Ext: no edema. L leg pain.  Musculoskeletal:  No deformities, BUE and BLE strength normal and equal Skin: warm and dry, ecchymosis from fall Neuro:  no focal abnormalities noted Psych:  Normal affect   EKG:  The EKG was personally reviewed and demonstrates: SB, 59bpm, LBBB (known), LVH, poor R wave progression. TWI aVL.Marland Kitchen Most recent SB, 44bpm, alternating LBBB/RBBB Telemetry:  Telemetry was personally reviewed and demonstrates:  SB and NSR, at 8:10AM, up to 11 second pauses between beats with CHB. Alternating LBBB and RBBB noted on telemetry.   Relevant CV Studies: Echo 10/24/19 1. Left ventricular ejection fraction, by estimation, is 60 to 65%. The  left ventricle has normal function. The left ventricle has no regional  wall motion abnormalities. Left ventricular diastolic parameters are  consistent with Grade I diastolic  dysfunction (impaired relaxation).  2. Right ventricular systolic function is normal. The right ventricular  size is normal. There is mildly elevated pulmonary artery systolic  pressure. The estimated right ventricular systolic pressure is 85.6 mmHg.  3. The aortic valve is normal in structure. Aortic valve regurgitation is  mild.   Laboratory Data:  High Sensitivity Troponin:   Recent Labs  Lab 07/04/20 0810  TROPONINIHS 11     Chemistry Recent Labs  Lab 07/04/20 0810  NA 140  K 3.9  CL 106  CO2 25  GLUCOSE 106*  BUN 18  CREATININE 0.81  CALCIUM 9.9  GFRNONAA >60  ANIONGAP 9    Recent Labs  Lab 07/04/20 0810  PROT 8.1  ALBUMIN 4.2  AST 26  ALT 17  ALKPHOS 79  BILITOT 0.6   Hematology Recent Labs  Lab 07/04/20 0810  WBC 11.9*  RBC 5.15*  HGB 14.9  HCT 45.7  MCV 88.7  MCH 28.9  MCHC 32.6  RDW 13.6  PLT 309   BNPNo results for input(s): BNP, PROBNP in the last 168 hours.  DDimer No results for input(s): DDIMER in  the last 168 hours.   Radiology/Studies:  DG Chest 1 View  Result Date: 07/04/2020 CLINICAL DATA:  Pain following fall EXAM: CHEST  1 VIEW COMPARISON:  April 06, 2014 chest radiograph; chest CT September 30, 2019 FINDINGS: Underlying emphysematous changes better delineated on prior CT. There is no edema or airspace opacity. Heart is borderline enlarged with pulmonary vascularity normal. No adenopathy. No bone lesions. IMPRESSION: Underlying emphysematous change. No edema or airspace opacity. Borderline cardiac enlargement. Emphysema (ICD10-J43.9). Electronically Signed   By: Lowella Grip III M.D.   On: 07/04/2020 08:43   CT Head Wo Contrast  Result Date: 07/04/2020 CLINICAL DATA:  Pain after fall EXAM: CT HEAD WITHOUT CONTRAST CT CERVICAL SPINE WITHOUT CONTRAST TECHNIQUE: Multidetector CT imaging of the head and cervical spine was performed following the standard protocol without intravenous contrast. Multiplanar CT image reconstructions of the cervical spine were also generated. COMPARISON:  Brain MRI February 02, 2019 and MRI of the cervical spine July 15, 2013. FINDINGS: CT HEAD FINDINGS Brain: The ventricles and sulci are unchanged since February 02, 2019, normal in caliber. No acute mass effect. Cerebellum, brainstem, and basal cisterns are normal. Chronic white matter changes again identified. No acute cortical ischemia or infarct. Vascular: No hyperdense vessel or unexpected calcification. Skull: Normal. Negative for fracture or focal lesion. Sinuses/Orbits: No acute finding. Other: None. CT CERVICAL SPINE FINDINGS Alignment: Minimal anterolisthesis of C7 versus T1 unchanged since July 15, 2013. Minimal anterolisthesis of T1 versus T2 is also unchanged. No other malalignment identified.  Skull base and vertebrae: No acute fracture. No primary bone lesion or focal pathologic process. Soft tissues and spinal canal: No prevertebral fluid or swelling. No visible canal hematoma. Disc levels:  Multilevel  degenerative disc disease. Upper chest: Scarring in the apices, right greater than left, unchanged since previous chest CTs. Emphysematous changes are seen in the visualized portions of the lungs. Other: No other abnormalities are identified. IMPRESSION: 1. Chronic white matter changes. No acute intracranial abnormalities. 2. No acute malalignment or fracture. Electronically Signed   By: Dorise Bullion III M.D   On: 07/04/2020 07:58   CT Cervical Spine Wo Contrast  Result Date: 07/04/2020 CLINICAL DATA:  Pain after fall EXAM: CT HEAD WITHOUT CONTRAST CT CERVICAL SPINE WITHOUT CONTRAST TECHNIQUE: Multidetector CT imaging of the head and cervical spine was performed following the standard protocol without intravenous contrast. Multiplanar CT image reconstructions of the cervical spine were also generated. COMPARISON:  Brain MRI February 02, 2019 and MRI of the cervical spine July 15, 2013. FINDINGS: CT HEAD FINDINGS Brain: The ventricles and sulci are unchanged since February 02, 2019, normal in caliber. No acute mass effect. Cerebellum, brainstem, and basal cisterns are normal. Chronic white matter changes again identified. No acute cortical ischemia or infarct. Vascular: No hyperdense vessel or unexpected calcification. Skull: Normal. Negative for fracture or focal lesion. Sinuses/Orbits: No acute finding. Other: None. CT CERVICAL SPINE FINDINGS Alignment: Minimal anterolisthesis of C7 versus T1 unchanged since July 15, 2013. Minimal anterolisthesis of T1 versus T2 is also unchanged. No other malalignment identified. Skull base and vertebrae: No acute fracture. No primary bone lesion or focal pathologic process. Soft tissues and spinal canal: No prevertebral fluid or swelling. No visible canal hematoma. Disc levels:  Multilevel degenerative disc disease. Upper chest: Scarring in the apices, right greater than left, unchanged since previous chest CTs. Emphysematous changes are seen in the visualized portions of  the lungs. Other: No other abnormalities are identified. IMPRESSION: 1. Chronic white matter changes. No acute intracranial abnormalities. 2. No acute malalignment or fracture. Electronically Signed   By: Dorise Bullion III M.D   On: 07/04/2020 07:58   DG Hip Unilat W or Wo Pelvis 2-3 Views Left  Result Date: 07/04/2020 CLINICAL DATA:  Pain following fall EXAM: DG HIP (WITH OR WITHOUT PELVIS) 2-3V LEFT COMPARISON:  Left hip radiographs October 17, 2011 FINDINGS: Frontal pelvis as well as frontal and lateral left hip images were obtained. Bones are osteoporotic. There is a total hip replacement on the left with prosthetic components well-seated. No acute fracture or dislocation. There is mild narrowing of the right hip joint. No erosive changes. 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 No fracture or dislocation. Mild narrowing right hip joint. Bones osteoporotic. Electronically Signed   By: Lowella Grip III M.D.   On: 07/04/2020 07:52     Assessment and Plan:   CHB Alternating LBBB/RBBB --3 total syncopal events. (2 witnessed by nurses).  --H/o chest tightness as above. --Telemetry= intermittent CHB. Pauses up to 11 seconds. --EKG = alternating LBBB/RBBB. LBBB known. --No previous LHC.  --HS Tn 11, still cycling.  --Previous MPI low risk.   --Previous echo with nl EF, no significant valvular abnormalities. --Pending updated echo, ordered this AM. --Recommendations =LHC and pacer wire today, followed by PPM insertion.  --Contacted EP and interventional team to notify of pt.  --Pt and daughter informed of plan. --Keep NPO.  --Not currently on any AV nodal blocking agents. Avoid. --  Further recommendations pending discussion with team. Currently planning for LHC at [facility] / pacing wire today, followed by transfer to [facility] for PPM.  HTN --BP well controlled. Avoid AV nodal blocking agents.        For questions or updates, please  contact [place] Please consult [email or web address] for contact info under    Signed, Arvil Chaco, PA-C  07/04/2020 9:55 AM

## 2020-07-04 NOTE — Interval H&P Note (Signed)
History and Physical Interval Note:  07/04/2020 1:35 PM  Sheila Peters  has presented today for surgery, with the diagnosis of complete heart block, angina, and syncope.  The various methods of treatment have been discussed with the patient and family. After consideration of risks, benefits and other options for treatment, the patient has consented to  Procedure(s) with comments: LEFT HEART CATH AND CORONARY ANGIOGRAPHY possible percutaneous intervention, temporary transvenous temporary wire (N/A) - left heart cath and placement of temporary transvenous pacing wire as a surgical intervention.  The patient's history has been reviewed, patient examined, no change in status, stable for surgery.  I have reviewed the patient's chart and labs.  Questions were answered to the patient's satisfaction.    Cath Lab Visit (complete for each Cath Lab visit)  Clinical Evaluation Leading to the Procedure:   ACS: Yes.    Non-ACS:  N/A  [name]

## 2020-07-04 NOTE — ED Notes (Signed)
Pt refusing IV start/blood draw at this time. States "I'm not trying to be difficult, I just want to talk to the doctor first."

## 2020-07-05 ENCOUNTER — Inpatient Hospital Stay (HOSPITAL_COMMUNITY)
Admit: 2020-07-05 | Discharge: 2020-07-05 | Disposition: A | Discharge status: Home / Self Care | Payer: Medicare Other | Attending: Internal Medicine | Admitting: Internal Medicine

## 2020-07-05 ENCOUNTER — Encounter: Payer: Self-pay | Admitting: Internal Medicine

## 2020-07-05 ENCOUNTER — Encounter (HOSPITAL_COMMUNITY)
Admission: AD | Disposition: A | Discharge status: Home Health | Payer: Self-pay | Source: Other Acute Inpatient Hospital | Attending: Cardiology

## 2020-07-05 ENCOUNTER — Inpatient Hospital Stay (HOSPITAL_COMMUNITY)
Admission: AD | Admit: 2020-07-05 | Discharge: 2020-07-08 | DRG: 244 | Disposition: A | Discharge status: Home Health | Payer: Medicare Other | Source: Other Acute Inpatient Hospital | Attending: Cardiology | Admitting: Cardiology

## 2020-07-05 DIAGNOSIS — Z961 Presence of intraocular lens: Secondary | ICD-10-CM | POA: Diagnosis present

## 2020-07-05 DIAGNOSIS — Z96642 Presence of left artificial hip joint: Secondary | ICD-10-CM | POA: Diagnosis present

## 2020-07-05 DIAGNOSIS — I495 Sick sinus syndrome: Principal | ICD-10-CM | POA: Diagnosis present

## 2020-07-05 DIAGNOSIS — I10 Essential (primary) hypertension: Secondary | ICD-10-CM

## 2020-07-05 DIAGNOSIS — Z8249 Family history of ischemic heart disease and other diseases of the circulatory system: Secondary | ICD-10-CM

## 2020-07-05 DIAGNOSIS — Z90710 Acquired absence of both cervix and uterus: Secondary | ICD-10-CM

## 2020-07-05 DIAGNOSIS — M810 Age-related osteoporosis without current pathological fracture: Secondary | ICD-10-CM | POA: Diagnosis present

## 2020-07-05 DIAGNOSIS — J449 Chronic obstructive pulmonary disease, unspecified: Secondary | ICD-10-CM | POA: Diagnosis present

## 2020-07-05 DIAGNOSIS — E039 Hypothyroidism, unspecified: Secondary | ICD-10-CM | POA: Diagnosis present

## 2020-07-05 DIAGNOSIS — I442 Atrioventricular block, complete: Secondary | ICD-10-CM | POA: Diagnosis not present

## 2020-07-05 DIAGNOSIS — W19XXXA Unspecified fall, initial encounter: Secondary | ICD-10-CM

## 2020-07-05 DIAGNOSIS — J439 Emphysema, unspecified: Secondary | ICD-10-CM | POA: Diagnosis present

## 2020-07-05 DIAGNOSIS — Z91041 Radiographic dye allergy status: Secondary | ICD-10-CM

## 2020-07-05 DIAGNOSIS — Z87442 Personal history of urinary calculi: Secondary | ICD-10-CM

## 2020-07-05 DIAGNOSIS — Z9841 Cataract extraction status, right eye: Secondary | ICD-10-CM

## 2020-07-05 DIAGNOSIS — Z950 Presence of cardiac pacemaker: Secondary | ICD-10-CM | POA: Diagnosis present

## 2020-07-05 DIAGNOSIS — I447 Left bundle-branch block, unspecified: Secondary | ICD-10-CM | POA: Diagnosis present

## 2020-07-05 DIAGNOSIS — E785 Hyperlipidemia, unspecified: Secondary | ICD-10-CM | POA: Diagnosis present

## 2020-07-05 DIAGNOSIS — Z87891 Personal history of nicotine dependence: Secondary | ICD-10-CM

## 2020-07-05 DIAGNOSIS — Y92009 Unspecified place in unspecified non-institutional (private) residence as the place of occurrence of the external cause: Secondary | ICD-10-CM

## 2020-07-05 DIAGNOSIS — Z888 Allergy status to other drugs, medicaments and biological substances status: Secondary | ICD-10-CM

## 2020-07-05 DIAGNOSIS — R55 Syncope and collapse: Secondary | ICD-10-CM | POA: Diagnosis not present

## 2020-07-05 DIAGNOSIS — I4430 Unspecified atrioventricular block: Secondary | ICD-10-CM

## 2020-07-05 DIAGNOSIS — M25552 Pain in left hip: Secondary | ICD-10-CM | POA: Diagnosis not present

## 2020-07-05 DIAGNOSIS — K219 Gastro-esophageal reflux disease without esophagitis: Secondary | ICD-10-CM | POA: Diagnosis present

## 2020-07-05 DIAGNOSIS — Z885 Allergy status to narcotic agent status: Secondary | ICD-10-CM

## 2020-07-05 DIAGNOSIS — Z79899 Other long term (current) drug therapy: Secondary | ICD-10-CM

## 2020-07-05 DIAGNOSIS — I2510 Atherosclerotic heart disease of native coronary artery without angina pectoris: Secondary | ICD-10-CM | POA: Diagnosis present

## 2020-07-05 HISTORY — DX: Presence of cardiac pacemaker: Z95.0

## 2020-07-05 HISTORY — PX: PACEMAKER IMPLANT: EP1218

## 2020-07-05 LAB — BASIC METABOLIC PANEL
Anion gap: 8 (ref 5–15)
BUN: 21 mg/dL (ref 8–23)
CO2: 22 mmol/L (ref 22–32)
Calcium: 8.9 mg/dL (ref 8.9–10.3)
Chloride: 108 mmol/L (ref 98–111)
Creatinine, Ser: 0.83 mg/dL (ref 0.44–1.00)
GFR, Estimated: >60 mL/min (ref >60)
Glucose, Bld: 119 mg/dL — ABNORMAL HIGH (ref 70–99)
Potassium: 3.9 mmol/L (ref 3.5–5.1)
Sodium: 138 mmol/L (ref 135–145)

## 2020-07-05 LAB — GLUCOSE, CAPILLARY: Glucose-Capillary: 97 mg/dL (ref 70–99)

## 2020-07-05 LAB — CBC
HCT: 39.8 % (ref 36.0–46.0)
Hemoglobin: 13 g/dL (ref 12.0–15.0)
MCH: 28.8 pg (ref 26.0–34.0)
MCHC: 32.7 g/dL (ref 30.0–36.0)
MCV: 88.2 fL (ref 80.0–100.0)
Platelets: 247 10*3/uL (ref 150–400)
RBC: 4.51 MIL/uL (ref 3.87–5.11)
RDW: 13.6 % (ref 11.5–15.5)
WBC: 7 10*3/uL (ref 4.0–10.5)
nRBC: 0 % (ref 0.0–0.2)

## 2020-07-05 LAB — ECHOCARDIOGRAM COMPLETE
AV Mean grad: 4 mmHg
AV Peak grad: 8.1 mmHg
Ao pk vel: 1.42 m/s
Area-P 1/2: 2.19 cm2
Calc EF: 64.2 %
Height: 62 in
Single Plane A2C EF: 63.5 %
Single Plane A4C EF: 63 %
Weight: 1975.32 oz

## 2020-07-05 LAB — MAGNESIUM: Magnesium: 2.2 mg/dL (ref 1.7–2.4)

## 2020-07-05 LAB — PHOSPHORUS: Phosphorus: 3.5 mg/dL (ref 2.5–4.6)

## 2020-07-05 SURGERY — Surgical Case
Anesthesia: *Unknown

## 2020-07-05 SURGERY — PACEMAKER IMPLANT

## 2020-07-05 MED ORDER — ACETAMINOPHEN 325 MG PO TABS
325.0000 mg | ORAL_TABLET | ORAL | Status: DC | PRN
  Administered 2020-07-06 – 2020-07-08 (×8): 650 mg via ORAL
  Filled 2020-07-05 (×8): qty 2

## 2020-07-05 MED ORDER — SODIUM CHLORIDE 0.9 % IV SOLN: INTRAVENOUS | Status: DC

## 2020-07-05 MED ORDER — DIPHENHYDRAMINE HCL 50 MG/ML IJ SOLN: 50.0000 mg | Freq: Once | INTRAMUSCULAR | Status: DC

## 2020-07-05 MED ORDER — SODIUM CHLORIDE 0.9 % IV SOLN
80.0000 mg | INTRAVENOUS | Status: AC
  Administered 2020-07-05: 80 mg
  Filled 2020-07-05: qty 2

## 2020-07-05 MED ORDER — CHLORHEXIDINE GLUCONATE 4 % EX LIQD
60.0000 mL | Freq: Once | CUTANEOUS | Status: DC
Start: 2020-07-05 — End: 2020-07-05
  Filled 2020-07-05: qty 60

## 2020-07-05 MED ORDER — LEVOTHYROXINE SODIUM 50 MCG PO TABS
50.0000 ug | ORAL_TABLET | Freq: Every day | ORAL | Status: DC
  Administered 2020-07-06 – 2020-07-08 (×3): 50 ug via ORAL
  Filled 2020-07-05 (×3): qty 1

## 2020-07-05 MED ORDER — FENTANYL CITRATE (PF) 100 MCG/2ML IJ SOLN
INTRAMUSCULAR | Status: AC
  Filled 2020-07-05: qty 2

## 2020-07-05 MED ORDER — FENTANYL CITRATE (PF) 100 MCG/2ML IJ SOLN
INTRAMUSCULAR | Status: DC | PRN
  Administered 2020-07-05 (×2): 25 ug via INTRAVENOUS

## 2020-07-05 MED ORDER — VANCOMYCIN HCL 1000 MG/200ML IV SOLN
1000.0000 mg | INTRAVENOUS | Status: AC
  Administered 2020-07-05: 1000 mg via INTRAVENOUS
  Filled 2020-07-05: qty 200

## 2020-07-05 MED ORDER — MIDAZOLAM HCL 5 MG/5ML IJ SOLN
INTRAMUSCULAR | Status: AC
  Filled 2020-07-05: qty 5

## 2020-07-05 MED ORDER — MIDAZOLAM HCL 5 MG/5ML IJ SOLN
INTRAMUSCULAR | Status: DC | PRN
  Administered 2020-07-05 (×2): 1 mg via INTRAVENOUS

## 2020-07-05 MED ORDER — ONDANSETRON HCL 4 MG/2ML IJ SOLN: 4.0000 mg | Freq: Four times a day (QID) | INTRAMUSCULAR | Status: DC | PRN

## 2020-07-05 MED ORDER — VANCOMYCIN HCL IN DEXTROSE 1-5 GM/200ML-% IV SOLN
INTRAVENOUS | Status: AC
  Filled 2020-07-05: qty 200

## 2020-07-05 MED ORDER — DIPHENHYDRAMINE HCL 50 MG PO CAPS: 50.0000 mg | ORAL_CAPSULE | Freq: Once | ORAL | Status: DC

## 2020-07-05 MED ORDER — SODIUM CHLORIDE 0.9% FLUSH: 3.0000 mL | INTRAVENOUS | Status: DC | PRN

## 2020-07-05 MED ORDER — LIDOCAINE HCL (PF) 1 % IJ SOLN
INTRAMUSCULAR | Status: AC
  Filled 2020-07-05: qty 60

## 2020-07-05 MED ORDER — LIDOCAINE HCL (PF) 1 % IJ SOLN
INTRAMUSCULAR | Status: DC | PRN
  Administered 2020-07-05: 60 mL

## 2020-07-05 MED ORDER — METHYLPREDNISOLONE SODIUM SUCC 125 MG IJ SOLR: 40.0000 mg | INTRAMUSCULAR | Status: DC

## 2020-07-05 MED ORDER — CHLORHEXIDINE GLUCONATE 4 % EX LIQD
60.0000 mL | Freq: Once | CUTANEOUS | Status: DC
  Filled 2020-07-05: qty 60

## 2020-07-05 MED ORDER — SODIUM CHLORIDE 0.9 % IV SOLN: 250.0000 mL | INTRAVENOUS | Status: DC

## 2020-07-05 MED ORDER — SODIUM CHLORIDE 0.9% FLUSH
3.0000 mL | Freq: Two times a day (BID) | INTRAVENOUS | Status: DC
  Administered 2020-07-05: 3 mL via INTRAVENOUS

## 2020-07-05 MED ORDER — HEPARIN (PORCINE) IN NACL 1000-0.9 UT/500ML-% IV SOLN
INTRAVENOUS | Status: DC | PRN
  Administered 2020-07-05: 500 mL

## 2020-07-05 MED ORDER — SODIUM CHLORIDE 0.9 % IV SOLN
INTRAVENOUS | Status: AC
  Filled 2020-07-05: qty 2

## 2020-07-05 SURGICAL SUPPLY — 7 items
CABLE SURGICAL S-101-97-12 (CABLE) ×2 IMPLANT
LEAD TENDRIL MRI 46CM LPA1200M (Lead) ×1 IMPLANT
LEAD TENDRIL MRI 52CM LPA1200M (Lead) ×1 IMPLANT
PACEMAKER ASSURITY DR-RF (Pacemaker) ×1 IMPLANT
PAD PRO RADIOLUCENT 2001M-C (PAD) ×2 IMPLANT
SHEATH 8FR PRELUDE SNAP 13 (SHEATH) ×2 IMPLANT
TRAY PACEMAKER INSERTION (PACKS) ×2 IMPLANT

## 2020-07-05 NOTE — Progress Notes (Signed)
Patient transferring to [facility] this afternoon for planned PPM placement. Handoff given to [place] with [place]. Temp transvenous pacer attached to patient, VVI 40/3/2 via RIJ venous sheath. Patient A&O, VSS at discharge. Patient's daughter called and updated on time of transport.

## 2020-07-05 NOTE — Plan of Care (Signed)

## 2020-07-05 NOTE — Progress Notes (Signed)
*  PRELIMINARY RESULTS* Echocardiogram 2D Echocardiogram has been performed.  Sheila Peters Sheila Peters 07/05/2020, 1:17 PM

## 2020-07-05 NOTE — Progress Notes (Signed)
Progress Note  Patient Name: Sheila Peters Date of Encounter: 07/05/2020  Primary Cardiologist: Kathlyn Sacramento, MD  Subjective   No c/p, sob, presyncope.  R wrist cath site/R IJ temp wire site w/o complications.  NPO pending tx to [facility] for PPM today.  Inpatient Medications    Scheduled Meds: . amLODipine  2.5 mg Oral Daily  . Chlorhexidine Gluconate Cloth  6 each Topical Daily  . cholecalciferol  1,000 Units Oral Daily  . fluticasone furoate-vilanterol  1 puff Inhalation Daily   And  . umeclidinium bromide  1 puff Inhalation Daily  . heparin  5,000 Units Subcutaneous Q8H  . latanoprost  1 drop Both Eyes QHS  . levothyroxine  50 mcg Oral Daily  . lidocaine  1 patch Transdermal Q24H  . pantoprazole (PROTONIX) IV  40 mg Intravenous Daily  . psyllium  1 packet Oral TID  . sodium chloride flush  3 mL Intravenous Q12H  . vitamin B-12  100 mcg Oral Daily   Continuous Infusions: . sodium chloride    . sodium chloride    . sodium chloride 1 mL/kg/hr (07/05/20 0515)   PRN Meds: sodium chloride, acetaminophen, albuterol, atropine, calcium carbonate, dextromethorphan-guaiFENesin, hydrALAZINE, ondansetron (ZOFRAN) IV, oxyCODONE-acetaminophen, sodium chloride flush   Vital Signs    Vitals:   07/05/20 0300 07/05/20 0400 07/05/20 0600 07/05/20 0700  BP: 123/70 127/66 133/71 117/62  Pulse: 64 63 (!) 58 (!) 58  Resp: 19 15 14 14   Temp:  98.2 F (36.8 C)    TempSrc:  Axillary    SpO2: 98% 99% 99% 100%  Weight:      Height:        Intake/Output Summary (Last 24 hours) at 07/05/2020 0755 Last data filed at 07/05/2020 0700 Gross per 24 hour  Intake 532.71 ml  Output 820 ml  Net -287.29 ml   Filed Weights   07/04/20 0641 07/04/20 2217  Weight: 56.2 kg 56 kg    Physical Exam   GEN: Thin, frail, in no acute distress.  HEENT: Grossly normal.  Neck: Supple, no JVD, carotid bruits, or masses. Cardiac: RRR, no murmurs, rubs, or gallops. No clubbing, cyanosis, edema.   Radials 2+, DP/PT 2+ and equal bilaterally. R wrist cath site and R IJ temp wire site w/o bleeding/bruit/hematoma. Respiratory:  Respirations regular and unlabored, clear to auscultation bilaterally. GI: Soft, nontender, nondistended, BS + x 4. MS: no deformity or atrophy. Skin: warm and dry, no rash. Neuro:  Strength and sensation are intact. Psych: AAOx3.  Normal affect.  Labs    Chemistry Recent Labs  Lab 07/04/20 0810 07/05/20 0508  NA 140 138  K 3.9 3.9  CL 106 108  CO2 25 22  GLUCOSE 106* 119*  BUN 18 21  CREATININE 0.81 0.83  CALCIUM 9.9 8.9  PROT 8.1  --   ALBUMIN 4.2  --   AST 26  --   ALT 17  --   ALKPHOS 79  --   BILITOT 0.6  --   GFRNONAA >60 >60  ANIONGAP 9 8     Hematology Recent Labs  Lab 07/04/20 0810 07/05/20 0508  WBC 11.9* 7.0  RBC 5.15* 4.51  HGB 14.9 13.0  HCT 45.7 39.8  MCV 88.7 88.2  MCH 28.9 28.8  MCHC 32.6 32.7  RDW 13.6 13.6  PLT 309 247    Cardiac Enzymes  Recent Labs  Lab 07/04/20 0810 07/04/20 1005  TROPONINIHS 11 14      BNP Recent Labs  Lab 07/04/20 0810  BNP 78.2    Radiology    DG Chest 1 View  Result Date: 07/04/2020 CLINICAL DATA:  Pain following fall EXAM: CHEST  1 VIEW COMPARISON:  April 06, 2014 chest radiograph; chest CT September 30, 2019 FINDINGS: Underlying emphysematous changes better delineated on prior CT. There is no edema or airspace opacity. Heart is borderline enlarged with pulmonary vascularity normal. No adenopathy. No bone lesions. IMPRESSION: Underlying emphysematous change. No edema or airspace opacity. Borderline cardiac enlargement. Emphysema (ICD10-J43.9). Electronically Signed   By: Lowella Grip III M.D.   On: 07/04/2020 08:43   CT Head Wo Contrast  Result Date: 07/04/2020 CLINICAL DATA:  Pain after fall EXAM: CT HEAD WITHOUT CONTRAST CT CERVICAL SPINE WITHOUT CONTRAST TECHNIQUE: Multidetector CT imaging of the head and cervical spine was performed following the standard protocol without  intravenous contrast. Multiplanar CT image reconstructions of the cervical spine were also generated. COMPARISON:  Brain MRI February 02, 2019 and MRI of the cervical spine July 15, 2013. FINDINGS: CT HEAD FINDINGS Brain: The ventricles and sulci are unchanged since February 02, 2019, normal in caliber. No acute mass effect. Cerebellum, brainstem, and basal cisterns are normal. Chronic white matter changes again identified. No acute cortical ischemia or infarct. Vascular: No hyperdense vessel or unexpected calcification. Skull: Normal. Negative for fracture or focal lesion. Sinuses/Orbits: No acute finding. Other: None. CT CERVICAL SPINE FINDINGS Alignment: Minimal anterolisthesis of C7 versus T1 unchanged since July 15, 2013. Minimal anterolisthesis of T1 versus T2 is also unchanged. No other malalignment identified. Skull base and vertebrae: No acute fracture. No primary bone lesion or focal pathologic process. Soft tissues and spinal canal: No prevertebral fluid or swelling. No visible canal hematoma. Disc levels:  Multilevel degenerative disc disease. Upper chest: Scarring in the apices, right greater than left, unchanged since previous chest CTs. Emphysematous changes are seen in the visualized portions of the lungs. Other: No other abnormalities are identified. IMPRESSION: 1. Chronic white matter changes. No acute intracranial abnormalities. 2. No acute malalignment or fracture. Electronically Signed   By: Dorise Bullion III M.D   On: 07/04/2020 07:58   CT Cervical Spine Wo Contrast  Result Date: 07/04/2020 CLINICAL DATA:  Pain after fall EXAM: CT HEAD WITHOUT CONTRAST CT CERVICAL SPINE WITHOUT CONTRAST TECHNIQUE: Multidetector CT imaging of the head and cervical spine was performed following the standard protocol without intravenous contrast. Multiplanar CT image reconstructions of the cervical spine were also generated. COMPARISON:  Brain MRI February 02, 2019 and MRI of the cervical spine July 15, 2013. FINDINGS: CT HEAD FINDINGS Brain: The ventricles and sulci are unchanged since February 02, 2019, normal in caliber. No acute mass effect. Cerebellum, brainstem, and basal cisterns are normal. Chronic white matter changes again identified. No acute cortical ischemia or infarct. Vascular: No hyperdense vessel or unexpected calcification. Skull: Normal. Negative for fracture or focal lesion. Sinuses/Orbits: No acute finding. Other: None. CT CERVICAL SPINE FINDINGS Alignment: Minimal anterolisthesis of C7 versus T1 unchanged since July 15, 2013. Minimal anterolisthesis of T1 versus T2 is also unchanged. No other malalignment identified. Skull base and vertebrae: No acute fracture. No primary bone lesion or focal pathologic process. Soft tissues and spinal canal: No prevertebral fluid or swelling. No visible canal hematoma. Disc levels:  Multilevel degenerative disc disease. Upper chest: Scarring in the apices, right greater than left, unchanged since previous chest CTs. Emphysematous changes are seen in the visualized portions of the lungs. Other: No other abnormalities are identified. IMPRESSION: 1.  Chronic white matter changes. No acute intracranial abnormalities. 2. No acute malalignment or fracture. Electronically Signed   By: Dorise Bullion III M.D   On: 07/04/2020 07:58   DG Chest Port 1 View  Result Date: 07/04/2020 CLINICAL DATA:  Temporary pacemaker placement EXAM: PORTABLE CHEST 1 VIEW COMPARISON:  July 04, 2020 FINDINGS: Pacemaker lead attached to the right heart. No pneumothorax. There is atelectasis in the left base. There is no edema or airspace opacity. Heart is upper normal in size with pulmonary vascularity normal. There is a skin fold on the left. No demonstrable adenopathy. No bone lesions. IMPRESSION: Pacemaker lead attached to the right heart without pneumothorax. Left base atelectasis. No edema or airspace opacity. Heart upper normal in size. Electronically Signed   By: Lowella Grip III M.D.   On: 07/04/2020 15:36   DG Hip Unilat W or Wo Pelvis 2-3 Views Left  Result Date: 07/04/2020 CLINICAL DATA:  Pain following fall EXAM: DG HIP (WITH OR WITHOUT PELVIS) 2-3V LEFT COMPARISON:  Left hip radiographs October 17, 2011 FINDINGS: Frontal pelvis as well as frontal and lateral left hip images were obtained. Bones are osteoporotic. There is a total hip replacement on the left with prosthetic components well-seated. No acute fracture or dislocation. There is mild narrowing of the right hip joint. No erosive changes. 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 No fracture or dislocation. Mild narrowing right hip joint. Bones osteoporotic. Electronically Signed   By: Lowella Grip III M.D.   On: 07/04/2020 07:52    Telemetry    Sinus brady to sinus rhythm - 40's to 60's. V pacing on demand - Personally Reviewed  ECG    RSR, 68, 1st deg AVB, LBBB - Personally Reviewed  Cardiac Studies   2D Echocardiogram 7.2021   1. Left ventricular ejection fraction, by estimation, is 60 to 65%. The  left ventricle has normal function. The left ventricle has no regional  wall motion abnormalities. Left ventricular diastolic parameters are  consistent with Grade I diastolic  dysfunction (impaired relaxation).   2. Right ventricular systolic function is normal. The right ventricular  size is normal. There is mildly elevated pulmonary artery systolic  pressure. The estimated right ventricular systolic pressure is 38.1 mmHg.   3. The aortic valve is normal in structure. Aortic valve regurgitation is  mild.  _____________   Cardiac Catheterization 3.13.2022  Left Main  Vessel is large. Vessel is angiographically normal.  Left Anterior Descending  Vessel is large.  First Diagonal Branch  Vessel is large in size.  1st Diag lesion is 25% stenosed.  Ramus Intermedius  Vessel is small.  Left Circumflex  Vessel is large.  Vessel is angiographically normal.  First Obtuse Marginal Branch  Vessel is small in size.  Second Obtuse Marginal Branch  Vessel is large in size.  Third Obtuse Marginal Branch  Vessel is moderate in size.  First Left Posterolateral Branch  Vessel is moderate in size.  Second Left Posterolateral Branch  Vessel is small in size.  Right Coronary Artery  Vessel is moderate in size. Vessel is angiographically normal.  Right Posterior Descending Artery  Vessel is moderate in size.   LV gram: EF greater than 65%  _____________   Patient Profile     80 y.o. 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  Assessment & Plan    1.  Syncope/Complete Heart Block/Alternating LBBB/RBBB:  Admitted 3/13 w/ syncope and finding of symptomatic 11 sec pause in ED.  CHB and alternating LBBB/RBBB noted on ECG/tele.  S/p cath revealing relatively nl cors (HsTrops nl; LVEF nl) and temp wire placement.  No events overnight - rates 40's to 60's in sinus w/ pacing on demand.  NPO w/ plan for tx to [facility] today for EP eval and PPM.  Pt agreeable to tx and pacer placement.  2.  Chest tightness:  In setting of above.  Minimal D1 dzs on cath, otw nl.  HsTrops nl.  EF nl by V gram.  Cont med rx.  3.  Essential HTN:  Stable.  Was on amlodipine 2.62m daily @ home - unlikely to have contributed to CHB.  4.  COPD:  No wheezing.  Cont home meds.  Signed, CMurray Hodgkins NP  07/05/2020, 7:55 AM    For questions or updates, please contact   Please consult [email or web address] for contact info under Cardiology/STEMI.

## 2020-07-05 NOTE — Discharge Summary (Signed)
[facility] [place] NAME: Sheila Peters    MR#:  [id number]  DATE OF BIRTH:  1940-06-24  DATE OF ADMISSION:  07/04/2020   ADMITTING PHYSICIAN: Ivor Costa, MD  DATE OF DISCHARGE: 07/05/2020  PRIMARY CARE PHYSICIAN: Tracie Harrier, MD   ADMISSION DIAGNOSIS:  Complete heart block ([place]) [I44.2] Syncope [R55] AVB (atrioventricular block) [I44.30] Left hip pain [M25.552] Fall, initial encounter [W19.XXXA] Syncope, unspecified syncope type [R55] DISCHARGE DIAGNOSIS:  Principal Problem:   Complete heart block (HCC) Active Problems:   Syncope   COPD (chronic obstructive pulmonary disease) (HCC)   GERD (gastroesophageal reflux disease)   Hypothyroidism   Ulcerative colitis, chronic (HCC)   HTN (hypertension)   Fall at home, initial encounter   Leukocytosis   AVB (atrioventricular block)  SECONDARY DIAGNOSIS:   Past Medical History:  Diagnosis Date  . Arthritis   . Asthma   . Clostridium difficile diarrhea   . Colitis, nonspecific   . COPD (chronic obstructive pulmonary disease) ([place])   . GERD (gastroesophageal reflux disease)   . Hypothyroidism    post-surgical  . Multiple thyroid nodules    post excision  . Nephrolithiasis    spontaneously passed  . Nephrolithiasis   . Non-toxic uninodular goiter   . Osteoporosis, postmenopausal   . Ulcerative colitis, chronic ([place])   . Ulcerative pancolitis without complication [facility])    HOSPITAL COURSE:  80 year old female with a known history of COPD, asthma, GERD, ulcerative colitis, kidney stone, PVC admitted status post fall, syncope and left hip pain.  Syncope:  Likely from cardiac arrhythmia/complete heart block Status post cath showing normal coronaries.  Status post temp wire placement and transferring to [facility] for EP evaluation and PPM placement  COPD Stable  GERD Pepcid  Hypothyroidism On Synthroid  Ulcerative colitis In remission with injection of Entvvio  Left  hip pain status post fall at home Negative head CT and negative CT of cervical spine. x-ray of the hip negative for any fracture   DISCHARGE CONDITIONS:  Stable CONSULTS OBTAINED:  Treatment Team:  Minna Merritts, MD Tyler Pita, MD DRUG ALLERGIES:   Allergies  Allergen Reactions  . Adhesive [Tape]     Tears skin  . Augmentin [Amoxicillin-Pot Clavulanate] Nausea Only    Dizziness  . Codeine Nausea Only    "dizzy"  . Contrast Media [Iodinated Diagnostic Agents] Hives  . Ibuprofen Hives and Swelling  . Iodine Other (See Comments)  . Levaquin [Levofloxacin In D5w] Nausea Only  . Morphine And Related Nausea Only    dizziness   DISCHARGE MEDICATIONS:   Allergies as of 07/05/2020      Reactions   Adhesive [tape]    Tears skin   Augmentin [amoxicillin-pot Clavulanate] Nausea Only   Dizziness   Codeine Nausea Only   "dizzy"   Contrast Media [iodinated Diagnostic Agents] Hives   Ibuprofen Hives, Swelling   Iodine Other (See Comments)   Levaquin [levofloxacin In D5w] Nausea Only   Morphine And Related Nausea Only   dizziness      Medication List    STOP taking these medications   amLODipine 2.5 MG tablet Commonly known as: NORVASC     TAKE these medications   acetaminophen 500 MG tablet Commonly known as: TYLENOL Take 500 mg by mouth every 6 (six) hours as needed.   cetirizine 10 MG tablet Commonly known as: ZYRTEC Take 10 mg by mouth as needed for allergies (before  remicade injection).   CITRACAL + D PO Take by mouth. Taking 1 daily   D3-1000 PO Take by mouth. Taking 1 daily   famotidine 20 MG tablet Commonly known as: PEPCID Take 20 mg by mouth daily.   latanoprost 0.005 % ophthalmic solution Commonly known as: XALATAN Place 1 drop into both eyes at bedtime.   levothyroxine 50 MCG tablet Commonly known as: SYNTHROID Take 50 mcg by mouth daily.   psyllium 58.6 % powder Commonly known as: METAMUCIL Take 1 packet by mouth 3 (three) times  daily.   TRELEGY ELLIPTA IN Inhale into the lungs.   vedolizumab 300 MG injection Commonly known as: ENTYVIO Inject 300 mg into the vein once a week.   VITAMIN B 12 PO Take by mouth. Taking 1 daily      DISCHARGE INSTRUCTIONS:   DIET:  Cardiac diet DISCHARGE CONDITION:  Stable ACTIVITY:  Activity as tolerated OXYGEN:  Home Oxygen: No.  Oxygen Delivery: room air DISCHARGE LOCATION:  [facility] under cardiologist/[facility] care  If you experience worsening of your admission symptoms, develop shortness of breath, life threatening emergency, suicidal or homicidal thoughts you must seek medical attention immediately by calling 911 or calling your MD immediately  if symptoms less severe.  You Must read complete instructions/literature along with all the possible adverse reactions/side effects for all the Medicines you take and that have been prescribed to you. Take any new Medicines after you have completely understood and accpet all the possible adverse reactions/side effects.   Please note  You were cared for by a hospitalist during your hospital stay. If you have any questions about your discharge medications or the care you received while you were in the hospital after you are discharged, you can call the unit and asked to speak with the hospitalist on call if the hospitalist that took care of you is not available. Once you are discharged, your primary care physician will handle any further medical issues. Please note that NO REFILLS for any discharge medications will be authorized once you are discharged, as it is imperative that you return to your primary care physician (or establish a relationship with a primary care physician if you do not have one) for your aftercare needs so that they can reassess your need for medications and monitor your lab values.    On the day of Discharge:  VITAL SIGNS:  Blood pressure 131/65, pulse 61, temperature 98.2 F (36.8 C), temperature  source Axillary, resp. rate 16, height 5' 2"  (1.575 m), weight 56 kg, SpO2 100 %. PHYSICAL EXAMINATION:  GENERAL:  80 y.o.-year-old patient lying in the bed with no acute distress.  EYES: Pupils equal, round, reactive to light and accommodation. No scleral icterus. Extraocular muscles intact.  HEENT: Head atraumatic, normocephalic. Oropharynx and nasopharynx clear.  NECK:  Supple, no jugular venous distention. No thyroid enlargement, no tenderness.  LUNGS: Normal breath sounds bilaterally, no wheezing, rales,rhonchi or crepitation. No use of accessory muscles of respiration.  CARDIOVASCULAR: S1, S2 normal. No murmurs, rubs, or gallops.  ABDOMEN: Soft, non-tender, non-distended. Bowel sounds present. No organomegaly or mass.  EXTREMITIES: No pedal edema, cyanosis, or clubbing.  NEUROLOGIC: Cranial nerves II through XII are intact. Muscle strength 5/5 in all extremities. Sensation intact. Gait not checked.  PSYCHIATRIC: The patient is alert and oriented x 3.  SKIN: No obvious rash, lesion, or ulcer.  DATA REVIEW:   CBC Recent Labs  Lab 07/05/20 0508  WBC 7.0  HGB 13.0  HCT  39.8  PLT 247    Chemistries  Recent Labs  Lab 07/04/20 0810 07/05/20 0508  NA 140 138  K 3.9 3.9  CL 106 108  CO2 25 22  GLUCOSE 106* 119*  BUN 18 21  CREATININE 0.81 0.83  CALCIUM 9.9 8.9  MG 2.3 2.2  AST 26  --   ALT 17  --   ALKPHOS 79  --   BILITOT 0.6  --      Outpatient follow-up   30 Day Unplanned Readmission Risk Score   Flowsheet Row ED to Hosp-Admission (Current) from 07/04/2020 in [facility] ICU/CCU  30 Day Unplanned Readmission Risk Score (%) 12.01 Filed at 07/05/2020 1200     This score is the patient's risk of an unplanned readmission within 30 days of being discharged (0 -100%). The score is based on dignosis, age, lab data, medications, orders, and past utilization.   Low:  0-14.9   Medium: 15-21.9   High: 22-29.9   Extreme: 30 and above          Management plans discussed with the patient, family and they are in agreement.  CODE STATUS: Full Code   TOTAL TIME TAKING CARE OF THIS PATIENT: 45 minutes.    Max Sane M.D on 07/05/2020 at 12:40 PM  [facility]   CC: Primary care physician; Tracie Harrier, MD   Note: This dictation was prepared with Dragon dictation along with smaller phrase technology. Any transcriptional errors that result from this process are unintentional.

## 2020-07-05 NOTE — H&P (Signed)
Cardiology Admission History and Physical:   Patient ID: Sheila Peters MRN: [id number]; DOB: August 22, 1940   Admission date: 07/05/2020  PCP:  Tracie Harrier, MD   [place]  Cardiologist:  Kathlyn Sacramento, MD :[id number]}    Chief Complaint:  syncope  Patient Profile:   Sheila Peters is a 80 y.o. female with PMHx of of ulcerative colitis on Remicade, COPD (uses prn O2 at home) secondary to prior tobacco use (quit 2015), hypertension, hyperlipidemia, migraine HA, LBBB admitted after a syncopal event.  History of Present Illness:   [name] reports that occasionally she would have dizzy spells, but this morning was the first time she has ever fainted She was getting out of bed this morning felt fine and the next thing she knew she was on the floor.  Had struck her head and hip. She was brought to the ER where there she was observed to have pauses as long as 11 seconds, with associated LOC, this occurred reportedly 3 time. Cardiology was consulted, elicited some degree of CP and brought to the cath lab yesterday for LHC and temp pacing wire placement.  CT of the head and CT of the C-spine were negative for any acute issues.  X-ray of the left hip and pelvis was negative for fracture, chest x-ray showing emphysema without acute process.  LHC noted no obstructed CAD and temp pacing wire was placed and planned to transfer to [facility] today for PPM implant with no noted reversible causes for her CHB, cardiology notes observed alternating BBB while there  LABS K+ 3.9 > 3.9 Mag 2.3 BUN/Creat 21/0.83 HS Trop 11, 14 WBC 7.0 H/H 13/39 Plts 247  TSH 2.460  COVID neg  Home meds reviewed Amlodipine noted, not felt to contribute to CHB  Since the ER she has not had any recurrent syncope. Temp wire is VVI 40 back up She is conducting 1:1 here  She denies any CP, palpitations or SOB, she has some L hip discomfort that Tylenol at [facility] was  managing.    Past Medical History:  Diagnosis Date  . Arthritis   . Asthma   . Clostridium difficile diarrhea   . Colitis, nonspecific   . COPD (chronic obstructive pulmonary disease) ([place])   . GERD (gastroesophageal reflux disease)   . Hypothyroidism    post-surgical  . Multiple thyroid nodules    post excision  . Nephrolithiasis    spontaneously passed  . Nephrolithiasis   . Non-toxic uninodular goiter   . Osteoporosis, postmenopausal   . Ulcerative colitis, chronic ([place])   . Ulcerative pancolitis without complication [facility])     Past Surgical History:  Procedure Laterality Date  . ABDOMINAL HYSTERECTOMY    . ANKLE SURGERY     left fracture  . BACK SURGERY     herniated disk  . CATARACT EXTRACTION W/PHACO Right 10/23/2018   Procedure: CATARACT EXTRACTION PHACO AND INTRAOCULAR LENS PLACEMENT ([place]) RIGHT;  Surgeon: Leandrew Koyanagi, MD;  Location: [facility];  Service: Ophthalmology;  Laterality: Right;  PT WANTS TO BE LAST CASE  . COLONOSCOPY N/A 02/26/2017   Procedure: COLONOSCOPY;  Surgeon: Manya Silvas, MD;  Location: [facility] ENDOSCOPY;  Service: Endoscopy;  Laterality: N/A;  . COLONOSCOPY WITH PROPOFOL N/A 11/02/2014   Procedure: COLONOSCOPY WITH PROPOFOL;  Surgeon: Josefine Class, MD;  Location: [facility] ENDOSCOPY;  Service: Endoscopy;  Laterality: N/A;  . excision of thyroid nodule    . 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 JOINT REPLACEMENT     2014 lth  . LEFT HEART CATH AND CORONARY ANGIOGRAPHY N/A 07/04/2020   Procedure: LEFT HEART CATH AND CORONARY ANGIOGRAPHY possible percutaneous intervention, temporary transvenous temporary wire;  Surgeon: Nelva Bush, MD;  Location: [place] CV LAB;  Service: Cardiovascular;  Laterality: N/A;  left heart cath and placement of temporary transvenous pacing wire  . TEMPORARY  PACEMAKER N/A 07/04/2020   Procedure: TEMPORARY PACEMAKER;  Surgeon: Nelva Bush, MD;  Location: [place] CV LAB;  Service: Cardiovascular;  Laterality: N/A;     Medications Prior to Admission: Prior to Admission medications   Medication Sig Start Date End Date Taking? Authorizing Provider  acetaminophen (TYLENOL) 500 MG tablet Take 500 mg by mouth every 6 (six) hours as needed.    [provider]  Calcium Citrate-Vitamin D (CITRACAL + D PO) Take by mouth. Taking 1 daily    [provider]  cetirizine (ZYRTEC) 10 MG tablet Take 10 mg by mouth as needed for allergies (before remicade injection).    [provider]  Cholecalciferol (D3-1000 PO) Take by mouth. Taking 1 daily    [provider]  Cyanocobalamin (VITAMIN B 12 PO) Take by mouth. Taking 1 daily    [provider]  famotidine (PEPCID) 20 MG tablet Take 20 mg by mouth daily.    [provider]  Fluticasone-Umeclidin-Vilant (TRELEGY ELLIPTA IN) Inhale into the lungs.    [provider]  latanoprost (XALATAN) 0.005 % ophthalmic solution Place 1 drop into both eyes at bedtime.  10/09/19   [provider]  levothyroxine (SYNTHROID) 50 MCG tablet Take 50 mcg by mouth daily. 09/19/19   [provider]  psyllium (METAMUCIL) 58.6 % powder Take 1 packet by mouth 3 (three) times daily.    [provider]  vedolizumab (ENTYVIO) 300 MG injection Inject 300 mg into the vein once a week.    [provider]     Allergies:    Allergies  Allergen Reactions  . Adhesive [Tape]     Tears skin  . Augmentin [Amoxicillin-Pot Clavulanate] Nausea Only    Dizziness  . Codeine Nausea Only    "dizzy"  . Contrast Media [Iodinated Diagnostic Agents] Hives  . Ibuprofen Hives and Swelling  . Iodine Other (See Comments)  . Levaquin [Levofloxacin In D5w] Nausea Only  . Morphine And Related Nausea Only    dizziness    Social History:   Social History    Socioeconomic History  . Marital status: Widowed    Spouse name: Not on file  . Number of children: Not on file  . Years of education: Not on file  . Highest education level: Not on file  Occupational History  . Not on file  Tobacco Use  . Smoking status: Former Smoker    Packs/day: 0.50    Years: 50.00    Pack years: 25.00    Types: Cigarettes    Quit date: 03/05/2014    Years since quitting: 6.3  . Smokeless tobacco: Never Used  Vaping Use  . Vaping Use: Never used  Substance and Sexual Activity  . Alcohol use: Not Currently    Comment: occassionally. none last 62month  . Drug use: No  . Sexual activity: Not on file  Other Topics Concern  . Not on file  Social History Narrative  . Not on file   Social Determinants  of Health   Financial Resource Strain: Not on file  Food Insecurity: Not on file  Transportation Needs: Not on file  Physical Activity: Not on file  Stress: Not on file  Social Connections: Not on file  Intimate Partner Violence: Not on file    Family History:   The patient's family history includes Heart disease in her mother.    ROS:  Please see the history of present illness.  All other ROS reviewed and negative.     Physical Exam/Data:  There were no vitals filed for this visit. No intake or output data in the 24 hours ending 07/05/20 1602 Last 3 Weights 07/04/2020 07/04/2020 11/13/2019  Weight (lbs) 123 lb 7.3 oz 124 lb 124 lb 12.8 oz  Weight (kg) 56 kg 56.246 kg 56.609 kg     There is no height or weight on file to calculate BMI.  General:  Well nourished, well developed, in no acute distress, appears chronically ill HEENT: normal Lymph: no adenopathy Neck: no JVD, R IJ, temp wire Endocrine:  No thryomegaly Vascular: No carotid bruits  Cardiac:  RRR; no murmurs, gallops or rubs Lungs:  CTA b/l, no wheezing, rhonchi or rales  Abd: soft, nontender,  Ext: no edema Musculoskeletal:  No deformities, advanced atrophy Skin: warm and dry   Neuro:  no gross focal abnormalities noted Psych:  Normal affect    EKG:   personally reviewed and demonstrates  SB 59bpm, LBBB, QRS 14m SB episode of 3:1 conduction, , and 2:1, , V escape beats? Rate 44 SR 68bpm, borderline 1st degree AVblock, LBBB  Telemetry here SB 50's   Relevant CV Studies:  07/05/20; TTE IMPRESSIONS  1. Left ventricular ejection fraction, by estimation, is 55 to 60%. The  left ventricle has normal function. Left ventricular endocardial border  not optimally defined to evaluate regional wall motion. Left ventricular  diastolic parameters are consistent  with Grade I diastolic dysfunction (impaired relaxation). The average left  ventricular global longitudinal strain is -17.5 %. The global longitudinal  strain is normal.  2. Right ventricular systolic function is normal. The right ventricular  size is normal. Tricuspid regurgitation signal is inadequate for assessing  PA pressure.  3. The mitral valve is normal in structure. No evidence of mitral valve  regurgitation. No evidence of mitral stenosis.  4. The aortic valve is normal in structure. Aortic valve regurgitation is  mild. No aortic stenosis is present.  5. The inferior vena cava is normal in size with <50% respiratory  variability, suggesting right atrial pressure of 8 mmHg.    07/04/20: LHC/temp wire Conclusions: 1. 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  Otherwise, no significant coronary artery disease. 2. Normal left ventricular contraction and filling pressure. 3. Successful placement of 22F temporary transvenous pacing wire via the right internal jugular vein.  Recommendations: 1. STAT PCXR when patient arrives in ICU. 2. Maintain temporary pacemaker (rate 40 bpm, output 3 mA). [facility] pacemaker placement. 4. Medical therapy and risk factor modification to prevent progression  of mild CAD.    Laboratory Data:  High Sensitivity Troponin:   Recent Labs  Lab 07/04/20 0810 07/04/20 1005  TROPONINIHS 11 14      Chemistry Recent Labs  Lab 07/04/20 0810 07/05/20 0508  NA 140 138  K 3.9 3.9  CL 106 108  CO2 25 22  GLUCOSE 106* 119*  BUN 18 21  CREATININE 0.81 0.83  CALCIUM 9.9 8.9  GFRNONAA >60 >60  ANIONGAP 9 8    Recent Labs  Lab 07/04/20 0810  PROT 8.1  ALBUMIN 4.2  AST 26  ALT 17  ALKPHOS 79  BILITOT 0.6   Hematology Recent Labs  Lab 07/04/20 0810 07/05/20 0508  WBC 11.9* 7.0  RBC 5.15* 4.51  HGB 14.9 13.0  HCT 45.7 39.8  MCV 88.7 88.2  MCH 28.9 28.8  MCHC 32.6 32.7  RDW 13.6 13.6  PLT 309 247   BNP Recent Labs  Lab 07/04/20 0810  BNP 78.2    DDimer No results for input(s): DDIMER in the last 168 hours.   Radiology/Studies:     Assessment and Plan:   1. 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 Quentin Ore have seen and examined the patient, discussed rational for PPM, the procedure, potential risks and benefits, she is agreeable to proceed.  Anticipate discharge tomorrow  2. Ulcerative colitis      On Entyvio infusions,      She tells me once every 8 weeks, not due while here  3. COPD     Prn O2 at home  4. Hypothyroidism     Continue home synthroid   5. HTN     Home amlodipine post pacer   Risk Assessment/Risk Scores:  {  For questions or updates, please contact [place] Please consult [email or web address] for contact info under     Signed, Baldwin Jamaica, PA-C  07/05/2020 4:02 PM

## 2020-07-05 NOTE — Plan of Care (Signed)
  Problem: Education: Goal: Knowledge of General Education information will improve Description: Including pain rating scale, medication(s)/side effects and non-pharmacologic comfort measures Outcome: Progressing   Problem: Health Behavior/Discharge Planning: Goal: Ability to manage health-related needs will improve Outcome: Progressing   Problem: Clinical Measurements: Goal: Ability to maintain clinical measurements within normal limits will improve Outcome: Progressing Goal: Will remain free from infection Outcome: Progressing Goal: Diagnostic test results will improve Outcome: Progressing Goal: Respiratory complications will improve Outcome: Progressing Goal: Cardiovascular complication will be avoided Outcome: Progressing   Problem: Activity: Goal: Risk for activity intolerance will decrease Outcome: Progressing   Problem: Nutrition: Goal: Adequate nutrition will be maintained Outcome: Progressing   Problem: Coping: Goal: Level of anxiety will decrease Outcome: Progressing   Problem: Elimination: Goal: Will not experience complications related to bowel motility Outcome: Progressing Goal: Will not experience complications related to urinary retention Outcome: Progressing   Problem: Pain Managment: Goal: General experience of comfort will improve Outcome: Progressing   Problem: Safety: Goal: Ability to remain free from injury will improve Outcome: Progressing   Problem: Skin Integrity: Goal: Risk for impaired skin integrity will decrease Outcome: Progressing   Problem: Activity: Goal: Ability to return to baseline activity level will improve Outcome: Progressing   Problem: Cardiovascular: Goal: Ability to achieve and maintain adequate cardiovascular perfusion will improve Outcome: Progressing Goal: Vascular access site(s) Level 0-1 will be maintained Outcome: Progressing

## 2020-07-05 NOTE — Progress Notes (Signed)
Dr. Quentin Ore at side of stretcher talking w/patient.

## 2020-07-05 NOTE — Progress Notes (Signed)
Arrived by [facility] from [place] to [employer]. Alert and oriented; speech clear. Rt IJ temporary pacer to pacer box w/settings rate 40, MA 3. 0.9NS to sheath. Monitor shows SB-SR.

## 2020-07-05 NOTE — Progress Notes (Addendum)
NAME:  Sheila Peters, MRN:  [id number], DOB:  1940-09-06, LOS: 1 ADMISSION DATE:  07/04/2020, CONSULTATION DATE: 07/04/2020 REFERRING MD: Dr. Blaine Hamper, CHIEF COMPLAINT: Fall, syncope & left hip pain  Brief History:  80 year old female admitted post syncopal episode due to complete heart block status post temporary transvenous pacer placement.  History of Present Illness:  80 year old female presenting to the ED from home with the help of her daughter after a syncopal episode early in the morning causing her to fall possibly hitting her head and injuring her left hip.   ED course:per documentation in the emergency department and from [facility] service the patient was witnessed to briefly lose consciousness again while in the emergency department.  At the same time telemetry noted the patient's heart rate had decreased into the 40's, this lasted 30 seconds and the patient's heart rate returned back to the 50's.  At that point the patient regained consciousness without any interventions.  Dr. Tamala Julian in the emergency department reviewed the EKG which demonstrated near complete block with several nonconducted P waves lasting approximately 10 seconds and subsequent EKG showing several nonconducted P waves concerning for possible variable block.  Cardiology was consulted and bedside transcutaneous pads placed.  CT of the head and CT of the C-spine were negative for any acute issues.  X-ray of the left hip and pelvis was negative for fracture, chest x-ray showing emphysema without acute process. Initial vital signs: Afebrile at 98.3, RR 18, HR 71, BP 162/96, SPO2 97% on room air. Cardiology consulted and temporary transvenous pacemaker placed with plan for transfer tomorrow morning to for permanent pacemaker placement.  PCCM consulted as patient is being monitored in ICU prior to transfer.  Past Medical History:  Ulcerative colitis on Remicade COPD Former  smoker Hypertension Hyperlipidemia GERD Arthritis Hypothyroidism Osteoporosis  Significant Hospital Events:  07/04/2020-admit to ICU status post temporary transvenous pacer placement 07/05/2020-Plan for transfer to [facility] for PPM   Consults:  Cardiology PCCM  Procedures:  07/04/2020 left heart cath and coronary angiography, temporary transvenous pacemaker placement  Significant Diagnostic Tests:  07/04/2020 CT head and cervical spine without contrast >> no acute intracranial abnormalities, no acute malalignment or fracture 07/04/2020 x-ray left hip unilateral with pelvis >> 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 well seated, no fracture or dislocation, mild narrowing of the right hip joint 07/04/2020 left heart cath >> mild nonobstructive coronary artery disease with 20% to 30% D1 stenosis, normal left ventricular contraction filling pressure  Micro Data:  07/04/2020 COVID-19 >> negative 07/04/2020 influenza A/B >> negative 07/04/2020 MRSA PCR >> negative  Antimicrobials:  N/A  Interim History / Subjective:  S/p Temporary pacer placement yesterday, currently Sinus Loletha Grayer Hemodynamically stable, afebrile, on 2L [place] Currently denies dizziness, chest pain, SOB, abdominal pain, N/V/D, fever, chills Reports some discomfort to left hip where she fell, improved with Lidocaine patch   Objective   Blood pressure 131/62, pulse 63, temperature 98.2 F (36.8 C), temperature source Axillary, resp. rate 17, height 5' 2"  (1.575 m), weight 56 kg, SpO2 98 %.    FiO2 (%):  [28 %] 28 %   Intake/Output Summary (Last 24 hours) at 07/05/2020 0934 Last data filed at 07/05/2020 0700 Gross per 24 hour  Intake 532.71 ml  Output 820 ml  Net -287.29 ml   Filed Weights   07/04/20 0641 07/04/20 2217  Weight: 56.2 kg 56 kg    Examination: General: Acutely ill appearing female, laying in bed, on 2L  [place], in NAD HEENT: Atraumatic, normocephalic, neck supple, no  JVD Neuro: Sleeping, arouses to voice, A&O x4, follows commands, no focal deficits, speech clear, pupils PERRLA CV: Bradycardia, regular rhythm, s1s2, no M/R/G Pulm: Clear breath sounds bilaterally, no wheezing or rales noted, even, nonlabored GI: Soft, nontender, nondistended, no guarding or rebound tenderness, BS+ x4 GU: Pure wick in place with clear yellow urine Skin: Warm and dry.  No obvious rashes, lesions, or ulcerations  Extremities: Warm/dry.  2+ distal pulses, no deformities, no edema  Resolved Hospital Problem list     Assessment & Plan:  Complete heart block status post temporary transvenous pacer wire placement Chest Tightness due to Complete Heart Block PMHx: LBBB 3 total syncopal events on 07/04/2020 (2 witnessed by nurses-with corresponding complete heart block) telemetry showing intermittent CHB with pauses up to 11 seconds as well as alternating LBBB and RBBB.  Patient also complaining of chest tightness intermittently.  Troponins flat at 11> 14.  Left heart cath completed showing mild nonobstructive CAD. -Continuous cardiac monitoring -Cardiology following, appreciate input of Dr. Saunders Revel & Dr. Fletcher Anon -S/p Temporary Pacer placement 07/04/2020 -Plan for transfer to [facility] today 07/05/2020 for PPM placement -Keep NPO for now  -Echocardiogram pending  Hypertension -Continue amlodipine -Hydralazine as needed to maintain SBP less than 165  COPD without Acute Exacerbation -Supplemental O2 as needed to maintain O2 sats 88 to 94% -Follow intermittent CXR & ABG as needed -Prn Bronchodilators  - Continue breo ellipta + incruse ellipta       PCCM will sign off at this time.  Please re-consult if any critical care needs arise, or if we can be of further assistance.   Best practice (evaluated daily)  Diet: N.p.o. Pain/Anxiety/Delirium protocol (if indicated): Oxycodone as needed, Lidocaine patch VAP protocol (if indicated): N/A DVT prophylaxis: Heparin SQ GI  prophylaxis: Protonix Glucose control: Per primary Mobility: Bedrest Disposition: ICU  Goals of Care:  Last date of multidisciplinary goals of care discussion: 07/05/2020 Family and staff present:APP and patient Summary of discussion: Discussed plan of care including transfer to [facility] and permanent pacemaker placement, all questions and concerns answered at this time Follow up goals of care discussion due: 07/05/2020 Code Status: Full  Labs   CBC: Recent Labs  Lab 07/04/20 0810 07/05/20 0508  WBC 11.9* 7.0  NEUTROABS 8.9*  --   HGB 14.9 13.0  HCT 45.7 39.8  MCV 88.7 88.2  PLT 309 335    Basic Metabolic Panel: Recent Labs  Lab 07/04/20 0810 07/05/20 0508  NA 140 138  K 3.9 3.9  CL 106 108  CO2 25 22  GLUCOSE 106* 119*  BUN 18 21  CREATININE 0.81 0.83  CALCIUM 9.9 8.9  MG 2.3 2.2  PHOS  --  3.5   GFR: Estimated Creatinine Clearance: 44.2 mL/min (by C-G formula based on SCr of 0.83 mg/dL). Recent Labs  Lab 07/04/20 0810 07/05/20 0508  WBC 11.9* 7.0    Liver Function Tests: Recent Labs  Lab 07/04/20 0810  AST 26  ALT 17  ALKPHOS 79  BILITOT 0.6  PROT 8.1  ALBUMIN 4.2   No results for input(s): LIPASE, AMYLASE in the last 168 hours. No results for input(s): AMMONIA in the last 168 hours.  ABG No results found for: PHART, PCO2ART, PO2ART, HCO3, TCO2, ACIDBASEDEF, O2SAT   Coagulation Profile: Recent Labs  Lab 07/04/20 0810  INR 1.0    Cardiac Enzymes: No results for input(s): CKTOTAL, CKMB, CKMBINDEX, TROPONINI in the last 168  hours.  HbA1C: No results found for: HGBA1C  CBG: Recent Labs  Lab 07/04/20 1505  GLUCAP 130*    Review of Systems: Positives in bold  Gen: Denies fever, chills, weight change, fatigue, night sweats, + left hip pain HEENT: Denies blurred vision, double vision, hearing loss, tinnitus, sinus congestion, rhinorrhea, sore throat, neck stiffness, dysphagia PULM: Denies shortness of breath, cough, sputum  production, hemoptysis, wheezing CV: Denies chest pain, edema, orthopnea, paroxysmal nocturnal dyspnea, palpitations GI: Denies abdominal pain, nausea, vomiting, diarrhea, hematochezia, melena, constipation, change in bowel habits GU: Denies dysuria, hematuria, polyuria, oliguria, urethral discharge Endocrine: Denies hot or cold intolerance, polyuria, polyphagia or appetite change Derm: Denies rash, dry skin, scaling or peeling skin change Heme: Denies easy bruising, bleeding, bleeding gums Neuro: Denies headache, numbness, weakness, slurred speech, loss of memory or consciousness   Past Medical History:  She,  has a past medical history of Arthritis, Asthma, Clostridium difficile diarrhea, Colitis, nonspecific, COPD (chronic obstructive pulmonary disease) ([place]), GERD (gastroesophageal reflux disease), Hypothyroidism, Multiple thyroid nodules, Nephrolithiasis, Nephrolithiasis, Non-toxic uninodular goiter, Osteoporosis, postmenopausal, Ulcerative colitis, chronic ([place]), and Ulcerative pancolitis without complication ([place]).   Surgical History:   Past Surgical History:  Procedure Laterality Date   ABDOMINAL HYSTERECTOMY     ANKLE SURGERY     left fracture   BACK SURGERY     herniated disk   CATARACT EXTRACTION W/PHACO Right 10/23/2018   Procedure: CATARACT EXTRACTION PHACO AND INTRAOCULAR LENS PLACEMENT ([place]) RIGHT;  Surgeon: Leandrew Koyanagi, MD;  Location: [place];  Service: Ophthalmology;  Laterality: Right;  PT WANTS TO BE LAST CASE   COLONOSCOPY N/A 02/26/2017   Procedure: COLONOSCOPY;  Surgeon: Manya Silvas, MD;  Location[facility] ENDOSCOPY;  Service: Endoscopy;  Laterality: N/A;   COLONOSCOPY WITH PROPOFOL N/A 11/02/2014   Procedure: COLONOSCOPY WITH PROPOFOL;  Surgeon: Josefine Class, MD;  Location: [facility] ENDOSCOPY;  Service: Endoscopy;  Laterality: N/A;   excision of thyroid nodule     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   Procedure: FECAL TRANSPLANT;  Surgeon:  Manya Silvas, MD;  Location: [facility] ENDOSCOPY;  Service: Endoscopy;  Laterality: N/A;   JOINT REPLACEMENT     2014 lth   LEFT HEART CATH AND CORONARY ANGIOGRAPHY N/A 07/04/2020   Procedure: LEFT HEART CATH AND CORONARY ANGIOGRAPHY possible percutaneous intervention, temporary transvenous temporary wire;  Surgeon: Nelva Bush, MD;  Location: [place] CV LAB;  Service: Cardiovascular;  Laterality: N/A;  left heart cath and placement of temporary transvenous pacing wire   TEMPORARY PACEMAKER N/A 07/04/2020   Procedure: TEMPORARY PACEMAKER;  Surgeon: Nelva Bush, MD;  Location: [place] CV LAB;  Service: Cardiovascular;  Laterality: N/A;     Social History:   reports that she quit smoking about 6 years ago. Her smoking use included cigarettes. She has a 25.00 pack-year smoking history. She has never used smokeless tobacco. She reports previous alcohol use. She reports that she does not use drugs.   Family History:  Her family history includes Heart disease in her mother.   Allergies Allergies  Allergen Reactions   Adhesive [Tape]     Tears skin   Augmentin [Amoxicillin-Pot Clavulanate] Nausea Only    Dizziness   Codeine Nausea Only    "dizzy"   Contrast Media [Iodinated Diagnostic Agents] Hives   Ibuprofen Hives and Swelling   Iodine Other (See Comments)   Levaquin [Levofloxacin In D5w] Nausea Only   Morphine And Related Nausea Only    dizziness     Home  Medications  Prior to Admission medications   Medication Sig Start Date End Date Taking? Authorizing Provider  acetaminophen (TYLENOL) 500 MG tablet Take 500 mg by mouth every 6 (six) hours as needed.   Yes [provider]  amLODipine (NORVASC) 2.5 MG tablet Take 2.5 mg by mouth daily.   Yes [provider]  Calcium Citrate-Vitamin D (CITRACAL + D PO) Take by mouth. Taking 1 daily   Yes [provider]  Cholecalciferol (D3-1000 PO) Take by mouth. Taking 1 daily   Yes [provider]  Cyanocobalamin (VITAMIN B 12 PO) Take by mouth. Taking 1 daily   Yes [provider]  famotidine (PEPCID) 20 MG tablet Take 20 mg by mouth daily.   Yes [provider]  Fluticasone-Umeclidin-Vilant (TRELEGY ELLIPTA IN) Inhale into the lungs.   Yes [provider]  latanoprost (XALATAN) 0.005 % ophthalmic solution Place 1 drop into both eyes at bedtime.  10/09/19  Yes [provider]  levothyroxine (SYNTHROID) 50 MCG tablet Take 50 mcg by mouth daily. 09/19/19  Yes [provider]  psyllium (METAMUCIL) 58.6 % powder Take 1 packet by mouth 3 (three) times daily.   Yes [provider]  vedolizumab (ENTYVIO) 300 MG injection Inject 300 mg into the vein once a week.   Yes [provider]  cetirizine (ZYRTEC) 10 MG tablet Take 10 mg by mouth as needed for allergies (before remicade injection).    [provider]     Darel Hong, AGACNP-BC [facility] Pager: [phone]   PCCM ATTENDING ATTESTATION:  I have evaluated patient with the APP, I personally  reviewed database in its entirety and discussed care plan in detail. In addition, this patient was discussed on multidisciplinary rounds.   I agree with assessment and plan.  Prognosis is guarded, PCCM has nothing else to offer Will sign off at this time     Corrin Parker, M.D.  [facility]  Medical Director [place] Director [facility] Cardio-Pulmonary Department

## 2020-07-05 NOTE — Consult Note (Signed)
NAME:  Sheila Peters, MRN:  [id number], DOB:  1940-09-13, LOS: 1 ADMISSION DATE:  07/04/2020, CONSULTATION DATE: 07/04/2020 REFERRING MD: Dr. Blaine Hamper, CHIEF COMPLAINT: Fall, syncope & left hip pain  Brief History:  80 year old female admitted post syncopal episode due to complete heart block status post temporary transvenous pacer placement.  History of Present Illness:  80 year old female presenting to the ED from home with the help of her daughter after a syncopal episode early in the morning causing her to fall possibly hitting her head and injuring her left hip.   ED course:per documentation in the emergency department and from [facility] service the patient was witnessed to briefly lose consciousness again while in the emergency department.  At the same time telemetry noted the patient's heart rate had decreased into the 40's, this lasted 30 seconds and the patient's heart rate returned back to the 50's.  At that point the patient regained consciousness without any interventions.  Dr. Tamala Julian in the emergency department reviewed the EKG which demonstrated near complete block with several nonconducted P waves lasting approximately 10 seconds and subsequent EKG showing several nonconducted P waves concerning for possible variable block.  Cardiology was consulted and bedside transcutaneous pads placed.  CT of the head and CT of the C-spine were negative for any acute issues.  X-ray of the left hip and pelvis was negative for fracture, chest x-ray showing emphysema without acute process. Initial vital signs: Afebrile at 98.3, RR 18, HR 71, BP 162/96, SPO2 97% on room air. Cardiology consulted and temporary transvenous pacemaker placed with plan for transfer tomorrow morning to for permanent pacemaker placement.  PCCM consulted as patient is being monitored in ICU prior to transfer. Past Medical History:  Ulcerative colitis on Remicade COPD Former  smoker Hypertension Hyperlipidemia GERD Arthritis Hypothyroidism Osteoporosis  Significant Hospital Events:  07/04/2020-admit to ICU status post temporary transvenous pacer placement  Consults:  Cardiology PCCM  Procedures:  07/04/2020 left heart cath and coronary angiography, temporary transvenous pacemaker placement  Significant Diagnostic Tests:  07/04/2020 CT head and cervical spine without contrast >> no acute intracranial abnormalities, no acute malalignment or fracture 07/04/2020 x-ray left hip unilateral with pelvis >> 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  Reports having some pain with movement on the left side still.  Reports feeling " much improved" since her temporary pacer procedure. When discussing the syncopal event she denies having any thing that severe happened before, but does admit to experiencing lightheadedness when standing up quickly.  She also admits to intermittent feelings of chest tightness, without any real complaints of pain.  Labs/ Imaging personally reviewed Net: Euvolemic Na+/ K+: 140/3.9 BUN/Cr.:  18/0.18 Serum CO2/ AG: 25/9  Hgb: 14.9 Troponin: 11 > 14 BNP: 78.2 WBC/ TMAX: 11.9/98.3  CXR 07/04/2020: Pacemaker lead attached to the right heart without pneumothorax, left base atelectasis, no edema or airspace opacity. Objective   Blood pressure 119/66, pulse 64, temperature 98.2 F (36.8 C), temperature source Oral, resp. rate 15, height 5' 2"  (1.575 m), weight 56 kg, SpO2 97 %.     FiO2 (%):  [  28 %] 28 %   Intake/Output Summary (Last 24 hours) at 07/05/2020 0310 Last data filed at 07/04/2020 2221 Gross per 24 hour  Intake 434.36 ml  Output 350 ml  Net 84.36 ml   Filed Weights   07/04/20 0641 07/04/20 2217  Weight: 56.2 kg 56 kg    Examination: General: Adult female, critically ill, lying in bed, NAD HEENT: MM pink/moist, anicteric,, atraumatic, neck supple Neuro: A&O x 4, able to follow commands, PERRL +3, MAE CV: s1s2 RRR, NSR on monitor, no r/m/g Pulm: Regular, non labored on room air, breath sounds clear-BUL & diminished-BLL GI: soft, rounded, non tender, bs x 4 GU: Pure wick in place with clear yellow urine Skin: Limited exam- no rashes/lesions noted Extremities: warm/dry, pulses + 2 R/P, trace edema noted BLE  Resolved Hospital Problem list     Assessment & Plan:  Complete heart block status post temporary transvenous pacer wire placement PMHx: LBBB 3 total syncopal events on 07/04/2020 (2 witnessed by nurses-with corresponding complete heart block) telemetry showing intermittent CHB with pauses up to 11 seconds as well as alternating LBBB and RBBB.  Patient also complaining of chest tightness intermittently.  Troponins flat at 11> 14.  Left heart cath completed showing mild nonobstructive CAD. -Plan for transfer to [facility] on 07/05/2020 followed by PPM placement -Cardiology following appreciate input -Maintain n.p.o. -Echocardiogram pending -Oxycodone as needed for pain control  Hypertension -Continue amlodipine -Hydralazine as needed to maintain SBP less than 165  COPD -Continue bronchodilators as needed - Continue breo ellipta + incruse ellipta  Best practice (evaluated daily)  Diet: N.p.o. Pain/Anxiety/Delirium protocol (if indicated): Oxycodone as needed VAP protocol (if indicated): N/A DVT prophylaxis: Heparin SQ GI prophylaxis: Protonix Glucose control: Per primary Mobility: Bedrest Disposition: ICU  Goals of Care:  Last  date of multidisciplinary goals of care discussion: 07/04/2020 Family and staff present:APP and patient Summary of discussion: Discussed plan of care including transfer to [facility] and permanent pacemaker placement, all questions and concerns answered at this time Follow up goals of care discussion due: 07/05/2020 Code Status: Full  Labs   CBC: Recent Labs  Lab 07/04/20 0810  WBC 11.9*  NEUTROABS 8.9*  HGB 14.9  HCT 45.7  MCV 88.7  PLT 631    Basic Metabolic Panel: Recent Labs  Lab 07/04/20 0810  NA 140  K 3.9  CL 106  CO2 25  GLUCOSE 106*  BUN 18  CREATININE 0.81  CALCIUM 9.9  MG 2.3   GFR: Estimated Creatinine Clearance: 45.3 mL/min (by C-G formula based on SCr of 0.81 mg/dL). Recent Labs  Lab 07/04/20 0810  WBC 11.9*    Liver Function Tests: Recent Labs  Lab 07/04/20 0810  AST 26  ALT 17  ALKPHOS 79  BILITOT 0.6  PROT 8.1  ALBUMIN 4.2   No results for input(s): LIPASE, AMYLASE in the last 168 hours. No results for input(s): AMMONIA in the last 168 hours.  ABG No results found for: PHART, PCO2ART, PO2ART, HCO3, TCO2, ACIDBASEDEF, O2SAT   Coagulation Profile: Recent Labs  Lab 07/04/20 0810  INR 1.0    Cardiac Enzymes: No results for input(s): CKTOTAL, CKMB, CKMBINDEX, TROPONINI in the last 168 hours.  HbA1C: No results found for: HGBA1C  CBG: Recent Labs  Lab 07/04/20 1505  GLUCAP 130*    Review of Systems: Positives in bold  Gen: Denies fever, chills, weight change, fatigue, night sweats HEENT: Denies blurred vision, double vision, hearing loss, tinnitus, sinus congestion, rhinorrhea, sore throat,  neck stiffness, dysphagia PULM: Denies shortness of breath, cough, sputum production, hemoptysis, wheezing CV: Denies chest pain, edema, orthopnea, paroxysmal nocturnal dyspnea, palpitations GI: Denies abdominal pain, nausea, vomiting, diarrhea, hematochezia, melena, constipation, change in bowel habits GU: Denies dysuria, hematuria,  polyuria, oliguria, urethral discharge Endocrine: Denies hot or cold intolerance, polyuria, polyphagia or appetite change Derm: Denies rash, dry skin, scaling or peeling skin change Heme: Denies easy bruising, bleeding, bleeding gums Neuro: Denies headache, numbness, weakness, slurred speech, loss of memory or consciousness  Past Medical History:  She,  has a past medical history of Arthritis, Asthma, Clostridium difficile diarrhea, Colitis, nonspecific, COPD (chronic obstructive pulmonary disease) ([place]), GERD (gastroesophageal reflux disease), Hypothyroidism, Multiple thyroid nodules, Nephrolithiasis, Nephrolithiasis, Non-toxic uninodular goiter, Osteoporosis, postmenopausal, Ulcerative colitis, chronic ([place]), and Ulcerative pancolitis without complication ([place]).   Surgical History:   Past Surgical History:  Procedure Laterality Date  . ABDOMINAL HYSTERECTOMY    . ANKLE SURGERY     left fracture  . BACK SURGERY     herniated disk  . CATARACT EXTRACTION W/PHACO Right 10/23/2018   Procedure: CATARACT EXTRACTION PHACO AND INTRAOCULAR LENS PLACEMENT ([place]) RIGHT;  Surgeon: Leandrew Koyanagi, MD;  Location: [facility];  Service: Ophthalmology;  Laterality: Right;  PT WANTS TO BE LAST CASE  . COLONOSCOPY N/A 02/26/2017   Procedure: COLONOSCOPY;  Surgeon: Manya Silvas, MD;  [facility] END[facility]COPY;  Service: Endoscopy;  Laterality: N/A;  . COLONOSCOPY WITH PROPOFOL N/A 11/02/2014   Procedure: COLONOSCOPY WITH PROPOFOL;  Surgeon: Josefine Class, MD;  Location: [facility] ENDOSCOPY;  Service: Endoscopy;  Laterality: N/A;  . excision of thyroid nodule    . 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   Procedure: FECAL TRANSPLANT;  Surgeon: Manya Silvas, MD;  Location[facility] END[facility]COPY;  Service: Endoscopy;  Laterality: N/A;  . JOINT REPLACEMENT     2014 lth     Social History:   reports that she quit smoking about 6 years ago. Her smoking use included cigarettes. She has a 25.00 pack-year  smoking history. She has never used smokeless tobacco. She reports previous alcohol use. She reports that she does not use drugs.   Family History:  Her family history includes Heart disease in her mother.   Allergies Allergies  Allergen Reactions  . Adhesive [Tape]     Tears skin  . Augmentin [Amoxicillin-Pot Clavulanate] Nausea Only    Dizziness  . Codeine Nausea Only    "dizzy"  . Contrast Media [Iodinated Diagnostic Agents] Hives  . Ibuprofen Hives and Swelling  . Iodine Other (See Comments)  . Levaquin [Levofloxacin In D5w] Nausea Only  . Morphine And Related Nausea Only    dizziness     Home Medications  Prior to Admission medications   Medication Sig Start Date End Date Taking? Authorizing Provider  acetaminophen (TYLENOL) 500 MG tablet Take 500 mg by mouth every 6 (six) hours as needed.   Yes [provider]  amLODipine (NORVASC) 2.5 MG tablet Take 2.5 mg by mouth daily.   Yes [provider]  Calcium Citrate-Vitamin D (CITRACAL + D PO) Take by mouth. Taking 1 daily   Yes [provider]  Cholecalciferol (D3-1000 PO) Take by mouth. Taking 1 daily   Yes [provider]  Cyanocobalamin (VITAMIN B 12 PO) Take by mouth. Taking 1 daily   Yes [provider]  famotidine (PEPCID) 20 MG tablet Take 20 mg by mouth daily.   Yes [provider]  Fluticasone-Umeclidin-Vilant (TRELEGY ELLIPTA IN) Inhale into the lungs.  Yes [provider]  latanoprost (XALATAN) 0.005 % ophthalmic solution Place 1 drop into both eyes at bedtime.  10/09/19  Yes [provider]  levothyroxine (SYNTHROID) 50 MCG tablet Take 50 mcg by mouth daily. 09/19/19  Yes [provider]  psyllium (METAMUCIL) 58.6 % powder Take 1 packet by mouth 3 (three) times daily.   Yes [provider]  vedolizumab (ENTYVIO) 300 MG injection Inject 300 mg into the vein once a week.   Yes [provider]  cetirizine (ZYRTEC) 10 MG  tablet Take 10 mg by mouth as needed for allergies (before remicade injection).    [provider]     Critical care time: 35 minutes       Venetia Night, AGACNP-BC Acute Care Nurse Practitioner [facility]   [phone] / [phone] Please see Amion for pager details.

## 2020-07-06 ENCOUNTER — Encounter (HOSPITAL_COMMUNITY): Payer: Self-pay | Admitting: Cardiology

## 2020-07-06 ENCOUNTER — Ambulatory Visit (HOSPITAL_COMMUNITY): Payer: Medicare Other

## 2020-07-06 DIAGNOSIS — Z961 Presence of intraocular lens: Secondary | ICD-10-CM | POA: Diagnosis present

## 2020-07-06 DIAGNOSIS — J449 Chronic obstructive pulmonary disease, unspecified: Secondary | ICD-10-CM | POA: Diagnosis present

## 2020-07-06 DIAGNOSIS — Z87891 Personal history of nicotine dependence: Secondary | ICD-10-CM | POA: Diagnosis not present

## 2020-07-06 DIAGNOSIS — I2510 Atherosclerotic heart disease of native coronary artery without angina pectoris: Secondary | ICD-10-CM | POA: Diagnosis present

## 2020-07-06 DIAGNOSIS — J439 Emphysema, unspecified: Secondary | ICD-10-CM | POA: Diagnosis present

## 2020-07-06 DIAGNOSIS — Z91041 Radiographic dye allergy status: Secondary | ICD-10-CM | POA: Diagnosis not present

## 2020-07-06 DIAGNOSIS — Z888 Allergy status to other drugs, medicaments and biological substances status: Secondary | ICD-10-CM | POA: Diagnosis not present

## 2020-07-06 DIAGNOSIS — Z90710 Acquired absence of both cervix and uterus: Secondary | ICD-10-CM | POA: Diagnosis not present

## 2020-07-06 DIAGNOSIS — I495 Sick sinus syndrome: Secondary | ICD-10-CM | POA: Diagnosis present

## 2020-07-06 DIAGNOSIS — Z79899 Other long term (current) drug therapy: Secondary | ICD-10-CM | POA: Diagnosis not present

## 2020-07-06 DIAGNOSIS — E039 Hypothyroidism, unspecified: Secondary | ICD-10-CM | POA: Diagnosis present

## 2020-07-06 DIAGNOSIS — Z950 Presence of cardiac pacemaker: Secondary | ICD-10-CM

## 2020-07-06 DIAGNOSIS — K219 Gastro-esophageal reflux disease without esophagitis: Secondary | ICD-10-CM | POA: Diagnosis present

## 2020-07-06 DIAGNOSIS — Z885 Allergy status to narcotic agent status: Secondary | ICD-10-CM | POA: Diagnosis not present

## 2020-07-06 DIAGNOSIS — R55 Syncope and collapse: Secondary | ICD-10-CM | POA: Diagnosis present

## 2020-07-06 DIAGNOSIS — Z9841 Cataract extraction status, right eye: Secondary | ICD-10-CM | POA: Diagnosis not present

## 2020-07-06 DIAGNOSIS — M810 Age-related osteoporosis without current pathological fracture: Secondary | ICD-10-CM | POA: Diagnosis present

## 2020-07-06 DIAGNOSIS — Z8249 Family history of ischemic heart disease and other diseases of the circulatory system: Secondary | ICD-10-CM | POA: Diagnosis not present

## 2020-07-06 DIAGNOSIS — M25552 Pain in left hip: Secondary | ICD-10-CM | POA: Diagnosis not present

## 2020-07-06 DIAGNOSIS — E785 Hyperlipidemia, unspecified: Secondary | ICD-10-CM | POA: Diagnosis present

## 2020-07-06 DIAGNOSIS — I447 Left bundle-branch block, unspecified: Secondary | ICD-10-CM | POA: Diagnosis present

## 2020-07-06 DIAGNOSIS — I10 Essential (primary) hypertension: Secondary | ICD-10-CM | POA: Diagnosis present

## 2020-07-06 DIAGNOSIS — Z87442 Personal history of urinary calculi: Secondary | ICD-10-CM | POA: Diagnosis not present

## 2020-07-06 DIAGNOSIS — Z96642 Presence of left artificial hip joint: Secondary | ICD-10-CM | POA: Diagnosis present

## 2020-07-06 NOTE — NC FL2 (Signed)
[place] MEDICAID FL2 LEVEL OF CARE SCREENING TOOL     IDENTIFICATION  Patient Name: Sheila Peters Birthdate: 1940-08-31 Sex: female Admission Date (Current Location): 07/05/2020  [facility] and [place] Number:  Herbalist and Address:  [facility], [place] [address] [place], [place] [address]      Provider Number: [id number]  Attending Physician Name and Address:  Vickie Epley, MD  Relative Name and Phone Number:  Ivin Booty [phone]    Current Level of Care: Hospital Recommended Level of Care: [place] Prior Approval Number:    Date Approved/Denied:   PASRR Number: [id number]  Discharge Plan: SNF    Current Diagnoses: Patient Active Problem List   Diagnosis Date Noted  . Pacemaker 07/05/2020  . Syncope 07/04/2020  . Complete heart block ([place]) 07/04/2020  . 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 COPD (chronic obstructive pulmonary disease) ([place])   . GERD (gastroesophageal reflux disease)   . Hypothyroidism   . Ulcerative colitis, chronic ([place])   . HTN (hypertension)   . Fall at home, initial encounter   . Leukocytosis   . Swelling of limb 10/10/2016  . Pain in limb 10/10/2016  . Varicose veins of leg with pain, left 10/10/2016  . Abnormal CT scan of lung 10/29/2015  . Personal history of tobacco use, presenting hazards to health 11/04/2014  . Preop cardiovascular exam 09/11/2011  . SOB (shortness of breath) 08/21/2011  . Chest pain 08/21/2011  . PVC's (premature ventricular contractions) 08/21/2011    Orientation RESPIRATION BLADDER Height & Weight     Self,Time,Situation,Place  O2 (Nasal Cannula 2 liters) Incontinent,External catheter (External Urinary Catheter) Weight: 124 lb 12.5 oz (56.6 kg) Height:  5' 2"  (157.5 cm)  BEHAVIORAL SYMPTOMS/MOOD NEUROLOGICAL BOWEL NUTRITION STATUS      Continent Diet (See Discharge Summary)  AMBULATORY STATUS COMMUNICATION OF NEEDS Skin   Limited Assist  Verbally Skin abrasions (abrasion hip,shoulder,elbow,left)                       Personal Care Assistance Level of Assistance  Bathing,Feeding,Dressing Bathing Assistance: Limited assistance Feeding assistance: Independent Dressing Assistance: Limited assistance     Functional Limitations Info  Sight,Hearing,Speech Sight Info: Adequate Hearing Info: Adequate Speech Info: Adequate    SPECIAL CARE FACTORS FREQUENCY  PT (By licensed PT),OT (By licensed OT)     PT Frequency: 5x min weekly OT Frequency: 5x min weekly            Contractures Contractures Info: Not present    Additional Factors Info  Code Status,Allergies Code Status Info: FULL Allergies Info: Adhesive (tape),Augmentin (amoxicillin-pot Clavulanate),Codeine,Contrast Media (iodinated Diagnostic Agents),Ibuprofen,Iodine,Levaquin (levofloxacin In D5w),Morphine And Related           Current Medications (07/06/2020):  This is the current hospital active medication list Current Facility-Administered Medications  Medication Dose Route Frequency Provider Last Rate Last Admin  . acetaminophen (TYLENOL) tablet 325-650 mg  325-650 mg Oral Q4H PRN Vickie Epley, MD   650 mg at 07/06/20 0501  . levothyroxine (SYNTHROID) tablet 50 mcg  50 mcg Oral Daily Vickie Epley, MD   50 mcg at 07/06/20 0600  . ondansetron (ZOFRAN) injection 4 mg  4 mg Intravenous Q6H PRN Vickie Epley, MD         Discharge Medications: Please see discharge summary for a list of discharge medications.  Relevant Imaging Results:  Relevant Lab Results:   Additional Information [id number]  Nancee Liter  R[employer]ll [employer], [employer]

## 2020-07-06 NOTE — Progress Notes (Signed)
Pt returned from radiology.

## 2020-07-06 NOTE — TOC Initial Note (Signed)
Transition of Care [facility]) - Initial/Assessment Note    Patient Details  Name: Sheila Peters MRN: [id number] Date of Birth: 10/10/1940  [facility] CM/SW Contact:    Trula Ore, [place] Phone Number: 07/06/2020, 2:04 PM  Clinical Narrative:                   CSW received consult for possible SNF placement at time of discharge. CSW spoke with patient regarding PT recommendation of SNF placement at time of discharge. Patient expressed understanding of PT recommendation and is agreeable to SNF placement at time of discharge.Patient lives at home alone. Patient reports preference for [facility]. Second SNF choice will be [facility] . Patient has received the COVID vaccines as well as the  booster.  No further questions reported at this time. CSW to continue to follow and assist with discharge planning needs.  Expected Discharge Plan: Skilled Nursing Facility Barriers to Discharge: Continued Medical Work up   Patient Goals and CMS Choice Patient states their goals for this hospitalization and ongoing recovery are:: to go to SNF CMS Medicare.gov Compare Post Acute Care list provided to:: Patient Choice offered to / list presented to : Patient  Expected Discharge Plan and Services Expected Discharge Plan: [place] In-house Referral: Clinical Social Work     Living arrangements for the past 2 months: Single Family Home                                      Prior Living Arrangements/Services Living arrangements for the past 2 months: Single Family Home Lives with:: Self Patient language and need for interpreter reviewed:: Yes Do you feel safe going back to the place where you live?: No   SNF  Need for Family Participation in Patient Care: Yes (Comment) Care giver support system in place?: Yes (comment)   Criminal Activity/Legal Involvement Pertinent to Current Situation/Hospitalization: No - Comment as needed  Activities of Daily  Living Home Assistive Devices/Equipment: None ADL Screening (condition at time of admission) Patient's cognitive ability adequate to safely complete daily activities?: Yes Is the patient deaf or have difficulty hearing?: No Does the patient have difficulty seeing, even when wearing glasses/contacts?: No Does the patient have difficulty concentrating, remembering, or making decisions?: No Patient able to express need for assistance with ADLs?: Yes Does the patient have difficulty dressing or bathing?: No Independently performs ADLs?: Yes (appropriate for developmental age) Does the patient have difficulty walking or climbing stairs?: Yes Weakness of Legs: Left Weakness of Arms/Hands: None  Permission Sought/Granted Permission sought to share information with : Case Manager,Family Chief Financial Officer Permission granted to share information with : Yes, Verbal Permission Granted  Share Information with NAME: Ivin Booty  Permission granted to share info w AGENCY: SNF  Permission granted to share info w Relationship: daughter  Permission granted to share info w Contact Information: Ivin Booty [phone]  Emotional Assessment Appearance:: Appears stated age Attitude/Demeanor/Rapport: Gracious Affect (typically observed): Calm Orientation: : Oriented to Self,Oriented to Place,Oriented to  Time,Oriented to Situation Alcohol / Substance Use: Not Applicable Psych Involvement: No (comment)  Admission diagnosis:  Pacemaker [Z95.0] Syncope [R55] Patient Active Problem List   Diagnosis Date Noted  . Pacemaker 07/05/2020  . Syncope 07/04/2020  . Complete heart block ([place]) 07/04/2020  . 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 COPD (chronic obstructive pulmonary disease) ([place])   . GERD (gastroesophageal reflux disease)   .  Hypothyroidism   . Ulcerative colitis, chronic ([place])   . HTN (hypertension)   . Fall at home, initial encounter   . Leukocytosis   . Swelling of limb  10/10/2016  . Pain in limb 10/10/2016  . Varicose veins of leg with pain, left 10/10/2016  . Abnormal CT scan of lung 10/29/2015  . Personal history of tobacco use, presenting hazards to health 11/04/2014  . Preop cardiovascular exam 09/11/2011  . SOB (shortness of breath) 08/21/2011  . Chest pain 08/21/2011  . PVC's (premature ventricular contractions) 08/21/2011   PCP:  Tracie Harrier, MD Pharmacy:   [place], [place] [address] [place] [address] Phone: [phone] Fax: [phone]     Social Determinants of Health (SDOH) Interventions    Readmission Risk Interventions No flowsheet data found.

## 2020-07-06 NOTE — Progress Notes (Addendum)
Progress Note  Patient Name: Sheila Peters Date of Encounter: 07/06/2020  [facility] Cardiologist: Kathlyn [place], MD   Subjective   No CP, SOB.  A little achy from being in bed.  Mild L hip discomfort from her fall (syncope)  Inpatient Medications    Scheduled Meds: . levothyroxine  50 mcg Oral Daily   Continuous Infusions:  PRN Meds: acetaminophen, ondansetron (ZOFRAN) IV   Vital Signs    Vitals:   07/05/20 1940 07/06/20 0005 07/06/20 0423 07/06/20 0802  BP: (!) 149/65 118/60 (!) 114/91   Pulse: 65 65 65   Resp: 16 16 18    Temp: 97.8 F (36.6 C) 97.8 F (36.6 C) 98.3 F (36.8 C) (P) 98.3 F (36.8 C)  TempSrc: Oral Oral Oral (P) Oral  SpO2: 93% 96% 98%   Weight: 56 kg  56.6 kg   Height: 5' 2"  (1.575 m)      No intake or output data in the 24 hours ending 07/06/20 1002 Last 3 Weights 07/06/2020 07/05/2020 07/04/2020  Weight (lbs) 124 lb 12.5 oz 123 lb 7.3 oz 123 lb 7.3 oz  Weight (kg) 56.6 kg 56 kg 56 kg      Telemetry    SR, intermittent A pacing - Personally Reviewed  ECG    SR, some Apaced beats - Personally Reviewed  Physical Exam   GEN: No acute distress.   Neck: No JVD Cardiac: RRR, no murmurs, rubs, or gallops.  Respiratory: Clear to auscultation bilaterally. GI: Soft, nontender, non-distended  MS: No edema; No deformity. Neuro:  Nonfocal  Psych: Normal affect   L chest: dressing is clean dry, no hematoma  Labs    High Sensitivity Troponin:   Recent Labs  Lab 07/04/20 0810 07/04/20 1005  TROPONINIHS 11 14      Chemistry Recent Labs  Lab 07/04/20 0810 07/05/20 0508  NA 140 138  K 3.9 3.9  CL 106 108  CO2 25 22  GLUCOSE 106* 119*  BUN 18 21  CREATININE 0.81 0.83  CALCIUM 9.9 8.9  PROT 8.1  --   ALBUMIN 4.2  --   AST 26  --   ALT 17  --   ALKPHOS 79  --   BILITOT 0.6  --   GFRNONAA >60 >60  ANIONGAP 9 8     Hematology Recent Labs  Lab 07/04/20 0810 07/05/20 0508  WBC 11.9* 7.0  RBC 5.15* 4.51  HGB 14.9  13.0  HCT 45.7 39.8  MCV 88.7 88.2  MCH 28.9 28.8  MCHC 32.6 32.7  RDW 13.6 13.6  PLT 309 247    BNP Recent Labs  Lab 07/04/20 0810  BNP 78.2     DDimer No results for input(s): DDIMER in the last 168 hours.   Radiology    DG Chest 2 View Result Date: 07/06/2020 CLINICAL DATA:  Cardiac arrhythmia with pacemaker placement EXAM: CHEST - 2 VIEW COMPARISON:  July 04, 2020 FINDINGS: There is no a pacemaker on the left with lead tips attached to the right atrium and right ventricle. No pneumothorax. There is atelectatic change in each mid lung and right base region. No edema or airspace opacity. Heart is upper normal in size with pulmonary vascularity normal. No adenopathy. No bone lesions. IMPRESSION: Pacemaker leads attached to right atrium and right ventricle. No pneumothorax. Scattered areas of atelectatic change. No edema or airspace opacity. Stable cardiac silhouette. Electronically Signed   By: Lowella Grip III M.D.   On: 07/06/2020 08:04  Cardiac Studies   07/05/20; TTE IMPRESSIONS  1. Left ventricular ejection fraction, by estimation, is 55 to 60%. The  left ventricle has normal function. Left ventricular endocardial border  not optimally defined to evaluate regional wall motion. Left ventricular  diastolic parameters are consistent  with Grade I diastolic dysfunction (impaired relaxation). The average left  ventricular global longitudinal strain is -17.5 %. The global longitudinal  strain is normal.  2. Right ventricular systolic function is normal. The right ventricular  size is normal. Tricuspid regurgitation signal is inadequate for assessing  PA pressure.  3. The mitral valve is normal in structure. No evidence of mitral valve  regurgitation. No evidence of mitral stenosis.  4. The aortic valve is normal in structure. Aortic valve regurgitation is  mild. No aortic stenosis is present.  5. The inferior vena cava is normal in size with <50% respiratory   variability, suggesting right atrial pressure of 8 mmHg.    07/04/20: LHC/temp wire Conclusions: 1. 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 Otherwise, no significant coronary artery disease. 2. Normal left ventricular contraction and filling pressure. 3. Successful placement of 7F temporary transvenous pacing wire via the right internal jugular vein.  Recommendations: 1. STAT PCXR when patient arrives in ICU. 2. Maintain temporary pacemaker (rate 40 bpm, output 3 mA). 3. Likely transfer to [facility] tomorrow for EP evaluation and possible permanent pacemaker placement. 4. Medical therapy and risk factor modification to prevent progression of mild CAD.   Patient Profile     80 y.o. 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 Quentin Ore CXR without ptx this AM Device check with stable measurements Wound is stable  1st time OOB this AM required some assist and walker PT eval Likely to go home tomorrow given some functional decline after a few days of bedrest, hopefully oob today will help get her towards baseline and able to go home.  2. Ulcerative colitis      On Entyvio infusions,      She tells me once every 8 weeks, not due while here  3. COPD     Prn O2 at home  4. Hypothyroidism     Continue home synthroid   5. HTN     Home amlodipine post pacer  6. 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 No fracture or dislocation. Mild narrowing right hip joint. Bones osteoporotic. Offered pain med, she prefers to stick with Tylenol alone   For questions or updates, please contact [place] Please consult [email or web address] for contact info under        Signed, Baldwin [place], PA-C  07/06/2020, 10:02 AM

## 2020-07-06 NOTE — Progress Notes (Signed)
Pt transported to radiology.

## 2020-07-06 NOTE — Plan of Care (Signed)

## 2020-07-06 NOTE — Evaluation (Addendum)
Physical Therapy Evaluation Patient Details Name: Sheila Peters MRN: [id number] DOB: 08/11/1940 Today's Date: 07/06/2020   History of Present Illness  Pt is a 80 y/o female presenting from [facility] on 3/14 secondary to syncope and LBBB. Pt also reporting L hip pain from falling, however, imaging negative. Pt is s/p pacemaker on 3/14. PMH includes COPD and HTN.  Clinical Impression  Pt admitted secondary to problem above with deficits below. Pt requiring mod A to stand and ambulate very short distance. Pt with limited ability to bear weight on LLE secondary to pain and noted buckling in LLE when taking steps. Pt currently lives alone and reports she does not have 24/7 support. Given current deficits, feel she will require SNF level therapies, however, should she progress well with mobility, may be able to d/c home with HHPT. Will continue to follow acutely to maximize functional mobility independence and safety.     Follow Up Recommendations SNF;Supervision/Assistance - 24 hour    Equipment Recommendations  Other (comment) (TBD)    Recommendations for Other Services OT consult     Precautions / Restrictions Precautions Precautions: ICD/Pacemaker Precaution Comments: Reviewed pacemaker precautions. Restrictions Weight Bearing Restrictions: No Other Position/Activity Restrictions: LUE pacemaker precautions      Mobility  Bed Mobility               General bed mobility comments: In reclienr upon entry    Transfers Overall transfer level: Needs assistance Equipment used: 1 person hand held assist Transfers: Sit to/from Stand Sit to Stand: Mod assist         General transfer comment: Mod A for lift assist and steadying assist.  Ambulation/Gait Ambulation/Gait assistance: Mod assist Gait Distance (Feet): 8 Feet Assistive device: 1 person hand held assist Gait Pattern/deviations: Step-to pattern;Decreased step length - right;Decreased step length - left;Decreased weight  shift to left;Antalgic Gait velocity: Decreased   General Gait Details: Pt with very antalgic gait. Difficulty bearing weight on LLE and requiring mod A for support. Pt with buckling in LLE as well. Distance limited secondary to pain.  Stairs            Wheelchair Mobility    Modified Rankin (Stroke Patients Only)       Balance Overall balance assessment: Needs assistance Sitting-balance support: No upper extremity supported;Feet supported Sitting balance-Leahy Scale: Fair     Standing balance support: Single extremity supported;During functional activity Standing balance-Leahy Scale: Poor Standing balance comment: Reliant on RUE and external support                             Pertinent Vitals/Pain Pain Assessment: Faces Faces Pain Scale: Hurts even more Pain Location: L hip Pain Descriptors / Indicators: Grimacing;Guarding Pain Intervention(s): Limited activity within patient's tolerance;Monitored during session;Repositioned    Home Living Family/patient expects to be discharged to:: Private residence Living Arrangements: Alone Available Help at Discharge: Family Type of Home: House Home Access: Stairs to enter Entrance Stairs-Rails: Psychiatric nurse of Steps: 4 Home Layout: One level Home Equipment: Cane - single point      Prior Function Level of Independence: Independent with assistive device(s)         Comments: Reports she uses cane at night, but otherwise independent. Still drives.     Hand Dominance        Extremity/Trunk Assessment   Upper Extremity Assessment Upper Extremity Assessment: Defer to OT evaluation    Lower Extremity Assessment Lower Extremity  Assessment: LLE deficits/detail LLE Deficits / Details: L hip pain from falling. Limited weightbearing    Cervical / Trunk Assessment Cervical / Trunk Assessment: Kyphotic  Communication   Communication: No difficulties  Cognition Arousal/Alertness:  Awake/alert Behavior During Therapy: WFL for tasks assessed/performed Overall Cognitive Status: Within Functional Limits for tasks assessed                                        General Comments      Exercises     Assessment/Plan    PT Assessment Patient needs continued PT services  PT Problem List Decreased strength;Decreased activity tolerance;Decreased mobility;Decreased balance;Decreased knowledge of precautions;Pain       PT Treatment Interventions DME instruction;Gait training;Therapeutic activities;Therapeutic exercise;Functional mobility training;Stair training;Balance training;Patient/family education    PT Goals (Current goals can be found in the Care Plan section)  Acute Rehab PT Goals Patient Stated Goal: to be able to walk better before going home PT Goal Formulation: With patient Time For Goal Achievement: 07/20/20 Potential to Achieve Goals: Good    Frequency Min 3X/week   Barriers to discharge Decreased caregiver support      Co-evaluation               AM-PAC PT "6 Clicks" Mobility  Outcome Measure Help needed turning from your back to your side while in a flat bed without using bedrails?: A Little Help needed moving from lying on your back to sitting on the side of a flat bed without using bedrails?: A Little Help needed moving to and from a bed to a chair (including a wheelchair)?: A Lot Help needed standing up from a chair using your arms (e.g., wheelchair or bedside chair)?: A Lot Help needed to walk in hospital room?: A Lot Help needed climbing 3-5 steps with a railing? : Total 6 Click Score: 13    End of Session Equipment Utilized During Treatment: Gait belt Activity Tolerance: Patient limited by pain Patient left: in chair;with call bell/phone within reach Nurse Communication: Mobility status PT Visit Diagnosis: Unsteadiness on feet (R26.81);Muscle weakness (generalized) (M62.81);Difficulty in walking, not elsewhere  classified (R26.2);Pain Pain - Right/Left: Left Pain - part of body: Hip    Time: 5277-8242 PT Time Calculation (min) (ACUTE ONLY): 16 min   Charges:   PT Evaluation $PT Eval Moderate Complexity: 1 Mod          Reuel Derby, PT, DPT  Acute Rehabilitation Services  Pager: [phone] Office: [phone]   Rudean Hitt 07/06/2020, 9:52 AM

## 2020-07-07 NOTE — Plan of Care (Signed)

## 2020-07-07 NOTE — Plan of Care (Signed)
  Problem: Education: Goal: Knowledge of General Education information will improve Description: Including pain rating scale, medication(s)/side effects and non-pharmacologic comfort measures Outcome: Progressing   Problem: Clinical Measurements: Goal: Ability to maintain clinical measurements within normal limits will improve Outcome: Progressing Goal: Diagnostic test results will improve Outcome: Progressing Goal: Respiratory complications will improve Outcome: Progressing Goal: Cardiovascular complication will be avoided Outcome: Progressing   Problem: Nutrition: Goal: Adequate nutrition will be maintained Outcome: Progressing   Problem: Elimination: Goal: Will not experience complications related to bowel motility Outcome: Progressing Goal: Will not experience complications related to urinary retention Outcome: Progressing   Problem: Pain Managment: Goal: General experience of comfort will improve Outcome: Progressing   Problem: Education: Goal: Will demonstrate proper wound care and an understanding of methods to prevent future damage Outcome: Progressing Goal: Knowledge of disease or condition will improve Outcome: Progressing Goal: Knowledge of the prescribed therapeutic regimen will improve Outcome: Progressing   Problem: Clinical Measurements: Goal: Postoperative complications will be avoided or minimized Outcome: Progressing   Problem: Skin Integrity: Goal: Wound healing without signs and symptoms of infection Outcome: Progressing

## 2020-07-07 NOTE — TOC Progression Note (Signed)
Transition of Care [facility]) - Progression Note    Patient Details  Name: Sheila Peters MRN: [id number] Date of Birth: Oct 26, 1940  Transition of Care [facility] CM/SW [place], [place] Phone Number: 07/07/2020, 11:53 AM  Clinical Narrative:     CSW met with patient at bedside. CSW provided patient with SNF bed offers. Patient accepted SNF bed with [facility] but is hopeful to work with PT one more time to see if she is strong enough to go home with home health services. CSW will continue to follow to see what PT says after working with patient. CSW will follow up with patient after patient works with PT.  CSW will continue to follow.    Expected Discharge Plan: [place] Barriers to Discharge: Continued Medical Work up  Expected Discharge Plan and Services Expected Discharge Plan: [place] In-house Referral: Clinical Social Work     Living arrangements for the past 2 months: Single Family Home                                       Social Determinants of Health (SDOH) Interventions    Readmission Risk Interventions No flowsheet data found.

## 2020-07-07 NOTE — Evaluation (Signed)
Occupational Therapy Evaluation Patient Details Name: Sheila Peters MRN: [id number] DOB: [birth year] Today's Date: 07/07/2020    History of Present Illness Pt is a 80 y/o female presenting from [facility] on 3/14 secondary to syncope and LBBB. Pt also reporting L hip pain from falling, however, imaging negative. Pt is s/p pacemaker on 3/14. PMH includes COPD and HTN, on 2L O2 at home.   Clinical Impression   Patient admitted for the diagnosis above.  PTA she was independent with all self care, light home management, meal prep, meds and community mobility.  Deficits impacting independence are listed below.  Currently she is needing up to Min A with lower body ADL, and supervision for mobility at a RW level.  She had a L hip fx in the remote past, has a hip kit, but needs a refresher course on how to use them.  OT will follow in the acute setting to maximize her functional status, HH OT is recommended post acute.      Follow Up Recommendations  Home health OT    Equipment Recommendations  Tub/shower seat    Recommendations for Other Services       Precautions / Restrictions Precautions Precautions: ICD/Pacemaker Precaution Comments: Reviewed pacemaker precautions. Restrictions Weight Bearing Restrictions: No Other Position/Activity Restrictions: LUE pacemaker precautions      Mobility Bed Mobility Overal bed mobility:  (up in chair pre/post)             General bed mobility comments: up in chair    Transfers Overall transfer level: Needs assistance Equipment used: Rolling walker (2 wheeled) Transfers: Sit to/from Stand Sit to Stand: Supervision         General transfer comment: pt/family instructed on guarding positions for safety and sequencing, pt able to perform with Supervision to/from bed/chair after instruction with good carryover of cueing    Balance Overall balance assessment: Needs assistance Sitting-balance support: No upper extremity supported;Feet  supported Sitting balance-Leahy Scale: Fair     Standing balance support: Bilateral upper extremity supported;Single extremity supported Standing balance-Leahy Scale: Poor Standing balance comment: moderately reliant on RW                           ADL either performed or assessed with clinical judgement   ADL Overall ADL's : Needs assistance/impaired Eating/Feeding: Independent   Grooming: Wash/dry hands;Wash/dry face;Supervision/safety;Standing   Upper Body Bathing: Supervision/ safety;Sitting   Lower Body Bathing: Minimal assistance;Sitting/lateral leans   Upper Body Dressing : Minimal assistance;Sitting   Lower Body Dressing: Minimal assistance;Sitting/lateral leans Lower Body Dressing Details (indicate cue type and reason): difficulty reaching for L foot             Functional mobility during ADLs: Supervision/safety;Rolling walker       Vision Patient Visual Report: No change from baseline       Perception     Praxis      Pertinent Vitals/Pain Pain Assessment: Faces Faces Pain Scale: Hurts little more Pain Location: L hip/knee and some L shoulder discomfort (tape allergy from PM site) Pain Descriptors / Indicators: Tender Pain Intervention(s): Monitored during session     Hand Dominance Right   Extremity/Trunk Assessment Upper Extremity Assessment Upper Extremity Assessment: Overall WFL for tasks assessed;LUE deficits/detail LUE Deficits / Details: restricted ROM due to pacemaker placement. LUE Sensation: WNL LUE Coordination: WNL           Communication Communication Communication: No difficulties   Cognition Arousal/Alertness: Awake/alert Behavior  During Therapy: WFL for tasks assessed/performed Overall Cognitive Status: Within Functional Limits for tasks assessed                                                  Home Living Family/patient expects to be discharged to:: Private residence Living  Arrangements: Alone Available Help at Discharge: Family;Available PRN/intermittently Type of Home: House Home Access: Stairs to enter   Entrance Stairs-Rails: Right;Left Home Layout: One level     Bathroom Shower/Tub: Occupational psychologist: Handicapped height Bathroom Accessibility: Yes How Accessible: Accessible via walker Home Equipment: Cane - single point;Shower seat - built in          Prior Functioning/Environment Level of Independence: Independent with assistive device(s)                 OT Problem List: Decreased strength;Decreased activity tolerance;Impaired balance (sitting and/or standing);Pain;Decreased knowledge of use of DME or AE      OT Treatment/Interventions: Self-care/ADL training;DME and/or AE instruction;Balance training;Therapeutic activities    OT Goals(Current goals can be found in the care plan section) Acute Rehab OT Goals Patient Stated Goal: be able to be confident at home OT Goal Formulation: With patient Time For Goal Achievement: 07/21/20 Potential to Achieve Goals: Good ADL Goals Pt Will Perform Grooming: with modified independence;sitting;standing Pt Will Perform Lower Body Bathing: with modified independence;sit to/from stand Pt Will Perform Lower Body Dressing: with modified independence;sit to/from stand Pt Will Transfer to Toilet: with modified independence;regular height toilet Pt Will Perform Toileting - Clothing Manipulation and hygiene: with modified independence;sit to/from stand  OT Frequency: Min 2X/week   Barriers to D/C:    none noted       Co-evaluation              AM-PAC OT "6 Clicks" Daily Activity     Outcome Measure Help from another person eating meals?: None Help from another person taking care of personal grooming?: None Help from another person toileting, which includes using toliet, bedpan, or urinal?: A Little Help from another person bathing (including washing, rinsing, drying)?: A  Little Help from another person to put on and taking off regular upper body clothing?: A Little Help from another person to put on and taking off regular lower body clothing?: A Little 6 Click Score: 20   End of Session Equipment Utilized During Treatment: Rolling walker;Oxygen Nurse Communication: Mobility status  Activity Tolerance: Patient tolerated treatment well Patient left: in chair;with call bell/phone within reach  OT Visit Diagnosis: Unsteadiness on feet (R26.81);Pain;History of falling (Z91.81)                Time: 2595-6387 OT Time Calculation (min): 21 min Charges:  OT General Charges $OT Visit: 1 Visit OT Evaluation $OT Eval Moderate Complexity: 1 Mod  07/07/2020  Rich, OTR/L  Acute Rehabilitation Services  Office:  [place] 07/07/2020, 4:43 PM

## 2020-07-07 NOTE — Progress Notes (Signed)
Progress Note  Patient Name: Sheila Peters Date of Encounter: 07/07/2020  [facility] Cardiologist: Kathlyn Sacramento, MD   Subjective   No CP, SOB.  Feels a bit better today overall.  Thinks she is stronger then yesterday.  Did not sleep well, unable to get comfortable in the bed, ended up in the recliner,  Inpatient Medications    Scheduled Meds: . levothyroxine  50 mcg Oral Daily   Continuous Infusions:  PRN Meds: acetaminophen, ondansetron (ZOFRAN) IV   Vital Signs    Vitals:   07/06/20 2137 07/07/20 0458 07/07/20 0500 07/07/20 0809  BP: 116/70 135/80 135/80 112/66  Pulse: 67 60 61 71  Resp: 20 17 16 20   Temp: 98.5 F (36.9 C) 98.5 F (36.9 C)  97.6 F (36.4 C)  TempSrc: Oral Oral  Oral  SpO2: 91% 97% 95%   Weight:  57.3 kg    Height:        Intake/Output Summary (Last 24 hours) at 07/07/2020 0900 Last data filed at 07/07/2020 0543 Gross per 24 hour  Intake 220 ml  Output 750 ml  Net -530 ml   Last 3 Weights 07/07/2020 07/06/2020 07/05/2020  Weight (lbs) 126 lb 6.4 oz 124 lb 12.5 oz 123 lb 7.3 oz  Weight (kg) 57.335 kg 56.6 kg 56 kg      Telemetry    SR - Personally Reviewed  ECG    No new EKGs - Personally Reviewed  Physical Exam   GEN: No acute distress.   Neck: No JVD Cardiac: RRR, no murmurs, rubs, or gallops.  Respiratory: CTA b/l GI: Soft, nontender, non-distended  MS: No edema; No deformity. Neuro:  Nonfocal  Psych: Normal affect   L chest: dressing remains clean dry, no hematoma  Labs    High Sensitivity Troponin:   Recent Labs  Lab 07/04/20 0810 07/04/20 1005  TROPONINIHS 11 14      Chemistry Recent Labs  Lab 07/04/20 0810 07/05/20 0508  NA 140 138  K 3.9 3.9  CL 106 108  CO2 25 22  GLUCOSE 106* 119*  BUN 18 21  CREATININE 0.81 0.83  CALCIUM 9.9 8.9  PROT 8.1  --   ALBUMIN 4.2  --   AST 26  --   ALT 17  --   ALKPHOS 79  --   BILITOT 0.6  --   GFRNONAA >60 >60  ANIONGAP 9 8     Hematology Recent Labs   Lab 07/04/20 0810 07/05/20 0508  WBC 11.9* 7.0  RBC 5.15* 4.51  HGB 14.9 13.0  HCT 45.7 39.8  MCV 88.7 88.2  MCH 28.9 28.8  MCHC 32.6 32.7  RDW 13.6 13.6  PLT 309 247    BNP Recent Labs  Lab 07/04/20 0810  BNP 78.2     DDimer No results for input(s): DDIMER in the last 168 hours.   Radiology    DG Chest 2 View Result Date: 07/06/2020 CLINICAL DATA:  Cardiac arrhythmia with pacemaker placement EXAM: CHEST - 2 VIEW COMPARISON:  July 04, 2020 FINDINGS: There is no a pacemaker on the left with lead tips attached to the right atrium and right ventricle. No pneumothorax. There is atelectatic change in each mid lung and right base region. No edema or airspace opacity. Heart is upper normal in size with pulmonary vascularity normal. No adenopathy. No bone lesions. IMPRESSION: Pacemaker leads attached to right atrium and right ventricle. No pneumothorax. Scattered areas of atelectatic change. No edema or airspace opacity. Stable  cardiac silhouette. Electronically Signed   By: Lowella Grip III M.D.   On: 07/06/2020 08:04      Cardiac Studies   07/05/20; TTE IMPRESSIONS  1. Left ventricular ejection fraction, by estimation, is 55 to 60%. The  left ventricle has normal function. Left ventricular endocardial border  not optimally defined to evaluate regional wall motion. Left ventricular  diastolic parameters are consistent  with Grade I diastolic dysfunction (impaired relaxation). The average left  ventricular global longitudinal strain is -17.5 %. The global longitudinal  strain is normal.  2. Right ventricular systolic function is normal. The right ventricular  size is normal. Tricuspid regurgitation signal is inadequate for assessing  PA pressure.  3. The mitral valve is normal in structure. No evidence of mitral valve  regurgitation. No evidence of mitral stenosis.  4. The aortic valve is normal in structure. Aortic valve regurgitation is  mild. No aortic stenosis  is present.  5. The inferior vena cava is normal in size with <50% respiratory  variability, suggesting right atrial pressure of 8 mmHg.    07/04/20: LHC/temp wire Conclusions: 1. 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 Otherwise, no significant coronary artery disease. 2. Normal left ventricular contraction and filling pressure. 3. Successful placement of 62F temporary transvenous pacing wire via the right internal jugular vein.  Recommendations: 1. STAT PCXR when patient arrives in ICU. 2. Maintain temporary pacemaker (rate 40 bpm, output 3 mA). 3. Likely transfer to [facility] tomorrow for EP evaluation and possible permanent pacemaker placement. 4. Medical therapy and risk factor modification to prevent progression of mild CAD.   Patient Profile     80 y.o. 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 Quentin Ore CXR POD #1 without ptx this AM Device check POD #1with stable measurements Wound remains stable  Yesterday PT recs were for SNF. She feels like every time she got up yesterday some she felt better. Would like another day to see if she will be able to go home. I have d/w OT, both they and PT will see her today  Hopefully able to d/c tomorrow to home with [facility] if needed  2. Ulcerative colitis      On Entyvio infusions,      She tells me once every 8 weeks, not due while here  3. COPD     Prn O2 at home  4. Hypothyroidism      Continue home synthroid   5. HTN     Home amlodipine, BP here looks ok without it  6. 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 No fracture or dislocation. Mild narrowing right hip joint. Bones osteoporotic. Offered pain med, she prefers to stick with Tylenol alone  She mentions less discomfort through the day yesterday though felt a little "loose"  yesterday when up  Will see how it feels today    For questions or updates, please contact [facility] Please consult [email or web address] for contact info under        Signed, Baldwin Jamaica, PA-C  07/07/2020, 9:00 AM

## 2020-07-07 NOTE — Progress Notes (Addendum)
Physical Therapy Treatment Patient Details Name: Sheila Peters MRN: [id number] DOB: May 30, 1940 Today's Date: 07/07/2020    History of Present Illness Pt is a 80 y/o female presenting from [facility] on 3/14 secondary to syncope and LBBB. Pt also reporting L hip pain from falling, however, imaging negative. Pt is s/p pacemaker on 3/14. PMH includes COPD and HTN, on 2L O2 at home.    PT Comments    Pt received in chair with daughter and granddaughter present in room and encouraging, pt motivated to progress mobility. She requires decreased assist for mobility tasks this date, able to perform transfers and gait with Supervision after instruction on safe sequencing and stable using RW with no LOB for greater than household distance ambulation tasks. Pt remains reliant on 2L O2 [place] with exertion and did desat to 85% on room air within 20f, once O2 [place] replaced on 2L improves to >92% within 1 minute. Pt continues to benefit from PT services to progress toward functional mobility goals. After discussion with Supervising PT [place] and pt/family, anticipate pt safe to DC home with increased supervision initially and HHPT. Of note, pt wants to talk to case management regarding community resources for increased assist at home in the future.   Follow Up Recommendations  Home health PT;Supervision for mobility/OOB (per pt/daughter she has short term 24/7 assist but family lives nearby)     Equipment Recommendations  Rolling walker with 5" wheels;3in1 (PT)    Recommendations for Other Services OT consult     Precautions / Restrictions Precautions Precautions: ICD/Pacemaker Precaution Comments: Reviewed pacemaker precautions. Restrictions Weight Bearing Restrictions: No Other Position/Activity Restrictions: LUE pacemaker precautions    Mobility  Bed Mobility Overal bed mobility:  (up in chair pre/post)             General bed mobility comments: up in chair    Transfers Overall transfer  level: Needs assistance Equipment used: Rolling walker (2 wheeled) Transfers: Sit to/from Stand Sit to Stand: Supervision         General transfer comment: pt/family instructed on guarding positions for safety and sequencing, pt able to perform with Supervision to/from bed/chair after instruction with good carryover of cueing  Ambulation/Gait Ambulation/Gait assistance: Supervision Gait Distance (Feet): 170 Feet Assistive device: Rolling walker (2 wheeled) Gait Pattern/deviations: Step-to pattern;Decreased step length - right;Decreased step length - left;Decreased weight shift to left;Antalgic Gait velocity: Decreased   General Gait Details: Pt with mildly antalgic gait, reporting more L knee discomfort than hip pain this date. No buckling this date.   Stairs             Wheelchair Mobility    Modified Rankin (Stroke Patients Only)       Balance Overall balance assessment: Needs assistance Sitting-balance support: No upper extremity supported;Feet supported Sitting balance-Leahy Scale: Fair     Standing balance support: Single extremity supported;During functional activity Standing balance-Leahy Scale: Poor Standing balance comment: moderately reliant on RUE support on RW (instructed on light LUE pushing), no LOB                            Cognition Arousal/Alertness: Awake/alert Behavior During Therapy: WFL for tasks assessed/performed Overall Cognitive Status: Within Functional Limits for tasks assessed                                 General Comments: good carryover of  instruction, pt mildly anxious regarding options for disposition and seems more comfortable after discussion with therapist.      Exercises General Exercises - Lower Extremity Ankle Circles/Pumps: AROM;Both;10 reps;Supine Quad Sets: AROM;Both;5 reps;Supine Other Exercises Other Exercises: reviewed seated/supine BLE HEP and handout given to reinforce    General  Comments General comments (skin integrity, edema, etc.): HR 68 bpm, BP WNL with transfers (mild dizziness but resolved quickly with standing break), needs 2L O2 [place] donned due to desat to 85% with gait in room on RA, with 2L [place] maintains >88% and mostly >93%, a couple standing breaks in hallway      Pertinent Vitals/Pain Pain Assessment: Faces Faces Pain Scale: Hurts little more Pain Location: L hip/knee and some L shoulder discomfort (tape allergy from PM site) Pain Descriptors / Indicators: Grimacing;Guarding Pain Intervention(s): Monitored during session;Repositioned;Premedicated before session    Home Living                      Prior Function            PT Goals (current goals can now be found in the care plan section) Acute Rehab PT Goals Patient Stated Goal: to be able to walk better before going home PT Goal Formulation: With patient Time For Goal Achievement: 07/20/20 Potential to Achieve Goals: Good Progress towards PT goals: Progressing toward goals    Frequency    Min 3X/week      PT Plan Discharge plan needs to be updated    Co-evaluation              AM-PAC PT "6 Clicks" Mobility   Outcome Measure  Help needed turning from your back to your side while in a flat bed without using bedrails?: A Little Help needed moving from lying on your back to sitting on the side of a flat bed without using bedrails?: A Little Help needed moving to and from a bed to a chair (including a wheelchair)?: A Little Help needed standing up from a chair using your arms (e.g., wheelchair or bedside chair)?: A Little Help needed to walk in hospital room?: A Little Help needed climbing 3-5 steps with a railing? : A Little 6 Click Score: 18    End of Session Equipment Utilized During Treatment: Gait belt;Oxygen Activity Tolerance: Patient tolerated treatment well Patient left: in chair;with call bell/phone within reach;with family/visitor present Nurse Communication:  Mobility status PT Visit Diagnosis: Unsteadiness on feet (R26.81);Muscle weakness (generalized) (M62.81);Difficulty in walking, not elsewhere classified (R26.2);Pain Pain - Right/Left: Left Pain - part of body: Knee     Time: 1455-1530 PT Time Calculation (min) (ACUTE ONLY): 35 min  Charges:  $Gait Training: 8-22 mins $Therapeutic Activity: 8-22 mins                     Nai Dasch P., PTA Acute Rehabilitation Services Pager: [phone] Office: [place] 07/07/2020, 3:59 PM

## 2020-07-08 NOTE — Discharge Instructions (Signed)
    Supplemental Discharge Instructions for  Pacemaker/Defibrillator Patients   Activity No heavy lifting or vigorous activity with your left/right arm for 6 to 8 weeks.  Do not raise your left/right arm above your head for one week.  Gradually raise your affected arm as drawn below.              07/15/20                   07/16/20                     07/17/20                   07/18/20 __  NO DRIVING for until cleared to at your wound check visit  .  WOUND CARE - Keep the wound area clean and dry.  Do not get this area wet , no showers until cleared to at your wound check visit - The tape/steri-strips on your wound will fall off; do not pull them off.  No bandage is needed on the site.  DO  NOT apply any creams, oils, or ointments to the wound area. - If you notice any drainage or discharge from the wound, any swelling or bruising at the site, or you develop a fever > 101? F after you are discharged home, call the office at once.  Special Instructions - You are still able to use cellular telephones; use the ear opposite the side where you have your pacemaker/defibrillator.  Avoid carrying your cellular phone near your device. - When traveling through airports, show security personnel your identification card to avoid being screened in the metal detectors.  Ask the security personnel to use the hand wand. - Avoid arc welding equipment, MRI testing (magnetic resonance imaging), TENS units (transcutaneous nerve stimulators).  Call the office for questions about other devices. - Avoid electrical appliances that are in poor condition or are not properly grounded. - Microwave ovens are safe to be near or to operate.

## 2020-07-08 NOTE — Discharge Summary (Addendum)
ELECTROPHYSIOLOGY PROCEDURE DISCHARGE SUMMARY    Patient ID: Sheila Peters,  MRN: [id number], DOB/AGE: Oct 11, 1940 80 y.o.  Admit date: 07/05/2020 Discharge date: 07/08/2020  Primary Care Physician: Tracie Harrier, MD  Primary Cardiologist: Dr. Fletcher Anon Electrophysiologist: Dr. Quentin Ore  Primary Discharge Diagnosis:  Symptomatic bradycardia status post pacemaker implantation this admission  Secondary Discharge Diagnosis:  1. COPD 2. Ulcerative colitis 3. HTN 4. LBBB 5. HLD  Allergies  Allergen Reactions  . Adhesive [Tape]     Tears skin  . Augmentin [Amoxicillin-Pot Clavulanate] Nausea Only    Dizziness  . Codeine Nausea Only    "dizzy"  . Contrast Media [Iodinated Diagnostic Agents] Hives  . Ibuprofen Hives and Swelling  . Iodine Other (See Comments)  . Levaquin [Levofloxacin In D5w] Nausea Only  . Morphine And Related Nausea Only    dizziness     Procedures This Admission:  07/04/20 Conclusions: 1. 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  Otherwise, no significant coronary artery disease. 2. Normal left ventricular contraction and filling pressure. 3. Successful placement of 62F temporary transvenous pacing wire via the right internal jugular vein.    07/05/20:  CONCLUSIONS:   1. Symptomatic sinus node dysfunction  2. Successful dual chamber permanent pacemaker implantation  3.  No early apparent complications.     Brief HPI: Sheila Peters is a 80 y.o. female with PMHx of ulcerative colitis on Entyvio, COPD (uses prn O2 at home) secondary to prior tobacco use (quit 2015), hypertension, hyperlipidemia, migraine HA, LBBB day of admission was getting out of bed felt fine and the next thing she knew she was on the floor.  Had struck her head and hip. She was brought to the [facility] ER where there she was observed to have pauses as long as 11 seconds, with associated LOC, this occurred reportedly 3 time. Cardiology was consulted,  elicited some degree of CP and brought to the cath lab for [facility] and temp pacing wire placement. Echo with preserved LVEF  CT of the head and CT of the C-spine were negative for any acute issues.X-ray of the left hip and pelvis was negative for fracture,chest x-ray showing emphysema without acute process.   Hospital Course:  LHC noted no obstructed CAD and temp pacing wire was placed and transferred to [facility] for PPM implant with no noted reversible causes for her CHB, cardiology notes observed alternating BBB while there as well.  She underwent PPM 07/05/20 with details as outlined above. She post pacer day one, noted to be weak, suspect 2/2 bed rest duration, and PT initially recommended SNF though the patient the next day showed improvement and ultimately felt home with home health and DME assists was safe.  She also has good family support.  Her L hip pain steadily improved as well.  She was monitored on telemetry through her stay and maintained SR.  Left chest was without hematoma , small area of ecchymosis lateral aspect of device sit.  The device was interrogated POD #1  and found to be functioning normally.  CXR was obtained POD #1 and demonstrated no pneumothorax status post device implantation.  Wound care, arm mobility, and restrictions were reviewed with the patient.  The patient feels well, denies any CP/SOB, with with minimal site discomfort.  She was examined by Dr. Quentin Ore and considered stable for discharge to home.    Physical Exam: Vitals:   07/07/20 1506 07/07/20 2010 07/08/20 1000 07/08/20 1006  BP: 125/74 122/73  Pulse: 72 69  66  Resp: (!) 22 17    Temp:  98.4 F (36.9 C) 98.5 F (36.9 C) 98.5 F (36.9 C)  TempSrc:  Oral Oral Oral  SpO2: 93% 95%  95%  Weight:      Height:        GEN- The patient is well appearing, alert and oriented x 3 today.   HEENT: normocephalic, atraumatic; sclera clear, conjunctiva pink; hearing intact; oropharynx clear; neck supple, no  JVP Lungs-  CTA b/l, normal work of breathing.  No wheezes, rales, rhonchi Heart- RRR, no murmurs, rubs or gallops, PMI not laterally displaced GI- soft, non-tender, non-distended Extremities- no clubbing, cyanosis, or edema MS- no significant deformity, age appropriate atrophy Skin- warm and dry, no rash or lesion, left chest without hematoma Psych- euthymic mood, full affect Neuro- no gross deficits   Labs:   Lab Results  Component Value Date   WBC 7.0 07/05/2020   HGB 13.0 07/05/2020   HCT 39.8 07/05/2020   MCV 88.2 07/05/2020   PLT 247 07/05/2020    Recent Labs  Lab 07/04/20 0810 07/05/20 0508  NA 140 138  K 3.9 3.9  CL 106 108  CO2 25 22  BUN 18 21  CREATININE 0.81 0.83  CALCIUM 9.9 8.9  PROT 8.1  --   BILITOT 0.6  --   ALKPHOS 79  --   ALT 17  --   AST 26  --   GLUCOSE 106* 119*    Discharge Medications:  Allergies as of 07/08/2020      Reactions   Adhesive [tape]    Tears skin   Augmentin [amoxicillin-pot Clavulanate] Nausea Only   Dizziness   Codeine Nausea Only   "dizzy"   Contrast Media [iodinated Diagnostic Agents] Hives   Ibuprofen Hives, Swelling   Iodine Other (See Comments)   Levaquin [levofloxacin In D5w] Nausea Only   Morphine And Related Nausea Only   dizziness      Medication List    TAKE these medications   acetaminophen 500 MG tablet Commonly known as: TYLENOL Take 500 mg by mouth every 6 (six) hours as needed.   cetirizine 10 MG tablet Commonly known as: ZYRTEC Take 10 mg by mouth as needed for allergies (before remicade injection).   CITRACAL + D PO Take by mouth. Taking 1 daily   D3-1000 PO Take by mouth. Taking 1 daily   famotidine 20 MG tablet Commonly known as: PEPCID Take 20 mg by mouth daily.   latanoprost 0.005 % ophthalmic solution Commonly known as: XALATAN Place 1 drop into both eyes at bedtime.   levothyroxine 50 MCG tablet Commonly known as: SYNTHROID Take 50 mcg by mouth daily.   psyllium 58.6 %  powder Commonly known as: METAMUCIL Take 1 packet by mouth 3 (three) times daily.   TRELEGY ELLIPTA IN Inhale into the lungs.   vedolizumab 300 MG injection Commonly known as: ENTYVIO Inject 300 mg into the vein once a week.   VITAMIN B 12 PO Take by mouth. Taking 1 daily            Durable Medical Equipment  (From admission, onward)         Start     Ordered   07/08/20 1024  For home use only DME Shower stool  Once        07/08/20 1023   07/08/20 1023  For home use only DME Walker rolling  Once       Question  Answer Comment  Walker: With 5 Inch Wheels   Patient needs a walker to treat with the following condition Gait instability      07/08/20 1023           Discharge Care Instructions  (From admission, onward)         Start     Ordered   07/08/20 0000  Discharge wound care:       Comments: As in AVS   07/08/20 1031          Disposition: Home with Home health PT/OT, has family support Discharge Instructions    Diet - low sodium heart healthy   Complete by: As directed    Discharge wound care:   Complete by: As directed    As in AVS   Increase activity slowly   Complete by: As directed       Contact information for follow-up providers    [facility] Follow up.   Specialty: Cardiology Why: 07/15/20 @ 2:40PM, wound check visit Contact information: [address] [place] [place]       Vickie Epley, MD Follow up.   Specialties: Cardiology, Radiology Why: 10/13/20 @ 10:40AM Contact information: [address] [place] [address] [phone]        [facility] Follow up.   Why: shower chair, bedside commode and rolling walker- [employer] to deliver to the room prior to d/c.  Contact information: [place] [address] [phone]            Contact information for after-discharge care    Destination    [facility] Preferred SNF .   Service: Skilled Nursing Contact information: [address] [place] [phone]                  Duration of Discharge Encounter: Greater than 30 minutes including physician time.  Venetia Night, PA-C 07/08/2020 10:39 AM

## 2020-07-08 NOTE — TOC Initial Note (Signed)
Transition of Care [facility]) - Initial/Assessment Note    Patient Details  Name: Sheila Peters MRN: [id number] Date of Birth: [birth year]  Transition of Care [facility]) CM/SW Contact:    Bethena Roys, RN Phone Number: 07/08/2020, 1:55 PM  Clinical Narrative: Patient presented for syncope. Patient is from home alone and has support of children. Family will check in with the patient once she gets home. Case Manager provided the patient with the Medicare.gov list and she chose [facility] for [facility] PT/OT. Case Manager called the [place] and the patient was accepted for the services. Start of care to begin within 24-48 hours post transition home. Durable medical equipment ordered via [employer]- rolling walker, bedside commode and she declined the shower chair. Patient will transition home via private vehicle.             Expected Discharge Plan: [place] Barriers to Discharge: No Barriers Identified   Patient Goals and CMS Choice Patient states their goals for this hospitalization and ongoing recovery are:: to return home with home health services CMS Medicare.gov Compare Post Acute Care list provided to:: Patient Choice offered to / list presented to : Patient  Expected Discharge Plan and Services Expected Discharge Plan: [place] In-house Referral: Clinical Social Work Discharge Planning Services: CM Consult Post Acute Care Choice: [place] arrangements for the past 2 months: Single Family Home Expected Discharge Date: 07/08/20               DME Arranged: [place]ford Rile rolling,Bedside commode DME Agency: [employer] Date DME Agency Contacted: 07/08/20 Time DME Agency Contacted: [id number] Representative spoke with at DME Agency: [place]: PT,OT [place] Agency: [place] ([place]) Date [place]: 07/08/20 Time HH Agency Contacted: [id number] Representative spoke with at [place]:  [place] Arrangements/Services Living arrangements for the past 2 months: [place] with:: Self Patient language and need for interpreter reviewed:: Yes Do you feel safe going back to the place where you live?: Yes   SNF  Need for Family Participation in Patient Care: Yes (Comment) Care giver support system in place?: Yes (comment)   Criminal Activity/Legal Involvement Pertinent to Current Situation/Hospitalization: No - Comment as needed  Activities of Daily Living Home Assistive Devices/Equipment: None ADL Screening (condition at time of admission) Patient's cognitive ability adequate to safely complete daily activities?: Yes Is the patient deaf or have difficulty hearing?: No Does the patient have difficulty seeing, even when wearing glasses/contacts?: No Does the patient have difficulty concentrating, remembering, or making decisions?: No Patient able to express need for assistance with ADLs?: Yes Does the patient have difficulty dressing or bathing?: No Independently performs ADLs?: Yes (appropriate for developmental age) Does the patient have difficulty walking or climbing stairs?: Yes Weakness of Legs: Left Weakness of Arms/Hands: None  Permission Sought/Granted Permission sought to share information with : Case Manager,Facility Contact Representative,Family Supports Permission granted to share information with : Yes, Verbal Permission Granted  Share Information with NAME: Ivin Booty  Permission granted to share info w AGENCY: [place] granted to share info w Relationship: daughter  Permission granted to share info w Contact Information: Ivin Booty [phone]  Emotional Assessment Appearance:: Appears stated age Attitude/Demeanor/Rapport: Engaged Affect (typically observed): Appropriate Orientation: : Oriented to Situation,Oriented to  Time,Oriented to Place,Oriented to Self Alcohol / Substance Use: Not Applicable Psych  Involvement: No (comment)  Admission diagnosis:  Pacemaker [Z95.0] Syncope [R55] Patient  Active Problem List   Diagnosis Date Noted  . Pacemaker 07/05/2020  . Syncope 07/04/2020  . 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 COPD (chronic obstructive pulmonary disease) ([place])   . GERD (gastroesophageal reflux disease)   . Hypothyroidism   . Ulcerative colitis, chronic ([place])   . HTN (hypertension)   . Fall at home, initial encounter   . Leukocytosis   . Swelling of limb 10/10/2016  . Pain in limb 10/10/2016  . Varicose veins of leg with pain, left 10/10/2016  . Abnormal CT scan of lung 10/29/2015  . Personal history of tobacco use, presenting hazards to health 11/04/2014  . Preop cardiovascular exam 09/11/2011  . SOB (shortness of breath) 08/21/2011  . Chest pain 08/21/2011  . PVC's (premature ventricular contractions) 08/21/2011   PCP:  Tracie Harrier, MD Pharmacy:   [place], [place] [address] [place] [address] Phone: [phone] Fax: [phone]   Readmission Risk Interventions No flowsheet data found.

## 2020-07-15 ENCOUNTER — Other Ambulatory Visit: Payer: Self-pay

## 2020-07-15 ENCOUNTER — Ambulatory Visit (INDEPENDENT_AMBULATORY_CARE_PROVIDER_SITE_OTHER): Payer: Medicare Other | Admitting: Emergency Medicine

## 2020-07-15 DIAGNOSIS — I442 Atrioventricular block, complete: Secondary | ICD-10-CM | POA: Diagnosis not present

## 2020-07-15 LAB — CUP PACEART INCLINIC DEVICE CHECK
Battery Remaining Longevity: 146 mo
Battery Voltage: 3.1 V
Brady Statistic RA Percent Paced: 16 %
Brady Statistic RV Percent Paced: 0.1 %
Date Time Interrogation Session: 20220324150513
Implantable Lead Implant Date: 20220314
Implantable Lead Implant Date: 20220314
Implantable Lead Location: 753859
Implantable Lead Location: 753860
Implantable Pulse Generator Implant Date: 20220314
Lead Channel Impedance Value: 462.5 Ohm
Lead Channel Impedance Value: 462.5 Ohm
Lead Channel Pacing Threshold Amplitude: 0.875 V
Lead Channel Pacing Threshold Amplitude: 1 V
Lead Channel Pacing Threshold Pulse Width: 0.5 ms
Lead Channel Pacing Threshold Pulse Width: 0.5 ms
Lead Channel Sensing Intrinsic Amplitude: 3 mV
Lead Channel Sensing Intrinsic Amplitude: 8.2 mV
Lead Channel Setting Pacing Amplitude: 1.25 V
Lead Channel Setting Pacing Amplitude: 1.875
Lead Channel Setting Pacing Pulse Width: 0.5 ms
Lead Channel Setting Sensing Sensitivity: 2 mV
Pulse Gen Model: 2272
Pulse Gen Serial Number: 6395904

## 2020-07-15 NOTE — Progress Notes (Signed)
Wound check appointment. Steri-strips removed. Wound without redness or edema. Incision edges approximated, wound well healed. Normal device function. Thresholds, sensing, and impedances consistent with implant measurements. Device auto capture programmed on for extra safety margin until 3 month visit. Histogram distribution appropriate for patient and level of activity. 2 AMS episodes recorded (Burden <1%), both occurred on 07/06/20, longest episode was 12 seconds with Peak A/V rate of 210/96.  No high ventricular rates noted. Patient educated about wound care, arm mobility, lifting restrictions. ROV 10/13/20 with Dr. Quentin Ore.  Patient is enrolled in remote monitoring, next scheduled check 10/05/20.

## 2020-07-16 ENCOUNTER — Telehealth: Payer: Self-pay | Admitting: Cardiovascular Disease

## 2020-07-16 ENCOUNTER — Telehealth: Payer: Self-pay | Admitting: Cardiology

## 2020-07-16 NOTE — Telephone Encounter (Signed)
Addressed by Dr. Quentin Ore. See 07/16/20 telephone encounter for documentation. Closing this encounter.

## 2020-07-16 NOTE — Telephone Encounter (Signed)
Pt c/o swelling: STAT is pt has developed SOB within 24 hours  1) How much weight have you gained and in what time span? Discharged 3/17 after pacemaker placed swelling began  2) If swelling, where is the swelling located? Feet and ankles  3) Are you currently taking a fluid pill? no  4) Are you currently SOB? Yes, COPD diagnosis  5) Do you have a log of your daily weights (if so, list)?   3/24 125  6) Have you gained 3 pounds in a day or 5 pounds in a week? Typically runs 120 and weighted yesterday and is 125 as of yesterday   7) Have you traveled recently? None other than recent hospital visit

## 2020-07-16 NOTE — Telephone Encounter (Signed)
Called and spoke to Sheila Peters.  She is doing well.  She tells me she has some ankle and feet swelling that worsens throughout the day and improves overnight when she has her feet elevated.  I reviewed her recent pacemaker interrogation and there is less than 1% ventricular pacing.  I suspect an element of venous insufficiency.  She has an appointment scheduled on July 20, 2020 with Laurann Montana, NP.  At that appointment, the patient and [name] can discussed using a as needed diuretic to help control her symptoms.  Patient was appreciative of the phone call did not have any further questions.  She knows to call back if her her symptoms worsen.  [name], MD, [facility] Cardiac Electrophysiology

## 2020-07-20 ENCOUNTER — Ambulatory Visit: Payer: Medicare Other | Admitting: Family

## 2020-07-22 DIAGNOSIS — I442 Atrioventricular block, complete: Secondary | ICD-10-CM

## 2020-07-22 HISTORY — DX: Atrioventricular block, complete: I44.2

## 2020-09-22 ENCOUNTER — Other Ambulatory Visit: Payer: Self-pay | Admitting: *Deleted

## 2020-09-22 DIAGNOSIS — Z87891 Personal history of nicotine dependence: Secondary | ICD-10-CM

## 2020-09-22 DIAGNOSIS — Z122 Encounter for screening for malignant neoplasm of respiratory organs: Secondary | ICD-10-CM

## 2020-09-22 NOTE — Progress Notes (Signed)
Contacted and scheduled for annual lung screening scan. Patient is a former smoker with a 46 pack year history.

## 2020-09-27 ENCOUNTER — Ambulatory Visit: Admission: RE | Admit: 2020-09-27 | Payer: Medicare Other | Source: Ambulatory Visit

## 2020-10-05 ENCOUNTER — Ambulatory Visit (INDEPENDENT_AMBULATORY_CARE_PROVIDER_SITE_OTHER): Payer: Medicare Other

## 2020-10-05 DIAGNOSIS — I442 Atrioventricular block, complete: Secondary | ICD-10-CM | POA: Diagnosis not present

## 2020-10-05 LAB — CUP PACEART REMOTE DEVICE CHECK
Battery Remaining Longevity: 131 mo
Battery Remaining Percentage: 95.5 %
Battery Voltage: 3.04 V
Brady Statistic AP VP Percent: 1 %
Brady Statistic AP VS Percent: 22 %
Brady Statistic AS VP Percent: 1 %
Brady Statistic AS VS Percent: 77 %
Brady Statistic RA Percent Paced: 22 %
Brady Statistic RV Percent Paced: 1 %
Date Time Interrogation Session: 20220614020015
Implantable Lead Implant Date: 20220314
Implantable Lead Implant Date: 20220314
Implantable Lead Location: 753859
Implantable Lead Location: 753860
Implantable Pulse Generator Implant Date: 20220314
Lead Channel Impedance Value: 530 Ohm
Lead Channel Impedance Value: 560 Ohm
Lead Channel Pacing Threshold Amplitude: 0.875 V
Lead Channel Pacing Threshold Amplitude: 1.125 V
Lead Channel Pacing Threshold Pulse Width: 0.5 ms
Lead Channel Pacing Threshold Pulse Width: 0.5 ms
Lead Channel Sensing Intrinsic Amplitude: 12 mV
Lead Channel Sensing Intrinsic Amplitude: 3.7 mV
Lead Channel Setting Pacing Amplitude: 1.125
Lead Channel Setting Pacing Amplitude: 2.125
Lead Channel Setting Pacing Pulse Width: 0.5 ms
Lead Channel Setting Sensing Sensitivity: 2 mV
Pulse Gen Model: 2272
Pulse Gen Serial Number: 6395904

## 2020-10-13 ENCOUNTER — Encounter: Payer: Medicare Other | Admitting: Cardiology

## 2020-10-13 ENCOUNTER — Other Ambulatory Visit: Payer: Self-pay

## 2020-10-13 ENCOUNTER — Ambulatory Visit
Admission: RE | Admit: 2020-10-13 | Discharge: 2020-10-13 | Disposition: A | Discharge status: Home / Self Care | Payer: Medicare Other | Source: Ambulatory Visit | Attending: Oncology | Admitting: Oncology

## 2020-10-13 DIAGNOSIS — Z87891 Personal history of nicotine dependence: Secondary | ICD-10-CM | POA: Diagnosis present

## 2020-10-13 DIAGNOSIS — Z122 Encounter for screening for malignant neoplasm of respiratory organs: Secondary | ICD-10-CM | POA: Insufficient documentation

## 2020-10-27 NOTE — Progress Notes (Signed)
Remote pacemaker transmission.   

## 2020-11-03 ENCOUNTER — Telehealth: Payer: Self-pay | Admitting: *Deleted

## 2020-11-03 NOTE — Telephone Encounter (Signed)
Per Dr. Mortimer Fries verbally- patient does not need abx and prednisone.   Patient is aware and voiced her understanding.  Will leave encounter open to schedule OV for 12/2020 once schedule is available.

## 2020-11-03 NOTE — Telephone Encounter (Signed)
Obtaining pulmonary recommendations.

## 2020-11-03 NOTE — Telephone Encounter (Addendum)
Adding Claudette Head, CMA, and Dr. Mortimer Fries as an Juluis Rainier.  The findings appear to be infectious.  Discussed with Dr. Mortimer Fries, and patient will be treated with azithromycin and prednisone taper packs.  Recommend follow-up short-term lung cancer screening in 3 months.  IMPRESSION: 1. Lung-RADS 0S, incomplete. Additional lung cancer screening CT images/or comparison to prior chest CT examinations is needed. Specifically, there appear to be evolving post infectious changes in the right lung which limits today's examination. Repeat low-dose lung cancer screening chest CT is recommended in 3 months following potential trial of antimicrobial therapy. 2. The "S" modifier above refers to potentially clinically significant non lung cancer related findings. Specifically, there is aortic atherosclerosis, in addition to left main and left anterior descending coronary artery disease. Assessment for potential risk factor modification, dietary therapy or pharmacologic therapy may be warranted, if clinically indicated. 3. Mild diffuse bronchial wall thickening with moderate to severe centrilobular and paraseptal emphysema; imaging findings suggestive of underlying COPD. 4. Dilatation of the pulmonic trunk (4.0 cm in diameter), concerning for pulmonary arterial hypertension.   Aortic Atherosclerosis (ICD10-I70.0) and Emphysema (ICD10-J43.9).

## 2020-11-12 ENCOUNTER — Other Ambulatory Visit: Payer: Self-pay | Admitting: *Deleted

## 2020-11-12 DIAGNOSIS — Z87891 Personal history of nicotine dependence: Secondary | ICD-10-CM

## 2020-11-12 DIAGNOSIS — Z122 Encounter for screening for malignant neoplasm of respiratory organs: Secondary | ICD-10-CM

## 2020-11-12 NOTE — Telephone Encounter (Signed)
It is okay to put on my schedule.

## 2020-11-12 NOTE — Telephone Encounter (Signed)
Sheila Peters scheduled for 01/12/2021 at 3:00. Patient is aware and voiced her understanding.  Nothing further needed at this time

## 2020-11-12 NOTE — Progress Notes (Signed)
error 

## 2020-11-12 NOTE — Telephone Encounter (Signed)
Received message form Shawn perkins via epic secure chat. CT is scheduled for 01/13/2021. Raquel Sarna is requesting to reschedule 01/12/2021 visit with Dr. Mortimer Fries until after CT. Dr. Mortimer Fries does not have any other availability in Sept. It will push the appt out to oct.  Raquel Sarna is questioning if patient can see Dr. Patsey Berthold so that patient does not have to wait until October to be seen.   Dr. Patsey Berthold, please advise. Thanks

## 2020-11-12 NOTE — Telephone Encounter (Signed)
Appt scheduled for 01/19/2021 at 11:00. Patient is aware and voiced her understanding.

## 2020-11-16 NOTE — Progress Notes (Signed)
Electrophysiology Office Follow up Visit Note:    Date:  11/17/2020   ID:  Sheila Peters, Sheila Peters May 02, 1940, MRN [id number]  PCP:  Tracie Harrier, MD  [facility] Cardiologist:  Kathlyn Sacramento, MD  [facility] Electrophysiologist:  Vickie Epley, MD    Interval History:    Sheila Peters is a 80 y.o. female who presents for a follow up visit after pacemaker implant on July 05, 2020 for sinus node dysfunction and syncope.  Since implant, several atrial high rate episodes have been observed on her device interrogation although each episode is short and the overall burden is less than 1%.  She tells me she has done well since the pacemaker implant.  No syncopal episodes.  She has recently had a skin cancer removed from her scalp.    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 complication ([place])     Past Surgical History:  Procedure Laterality Date   ABDOMINAL HYSTERECTOMY     ANKLE SURGERY     left fracture   BACK SURGERY     herniated disk   CATARACT EXTRACTION W/PHACO Right 10/23/2018   Procedure: CATARACT EXTRACTION PHACO AND INTRAOCULAR LENS PLACEMENT ([place]) RIGHT;  Surgeon: Leandrew Koyanagi, MD;  Location: [facility];  Service: Ophthalmology;  Laterality: Right;  PT WANTS TO BE LAST CASE   COLONOSCOPY N/A 02/26/2017   Procedure: COLONOSCOPY;  Surgeon: Manya Silvas, MD;  Location: [facility] ENDOSCOPY;  Service: Endoscopy;  Laterality: N/A;   COLONOSCOPY WITH PROPOFOL N/A 11/02/2014   Procedure: COLONOSCOPY  WITH PROPOFOL;  Surgeon: Josefine Class, MD;  Location[facility] ENDOSCOPY;  Service: Endoscopy;  Laterality: N/A;   excision of thyroid nodule     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   Procedure: FECAL TRANSPLANT;  Surgeon: Manya Silvas, MD;  Location: [facility] ENDOSCOPY;  Service: Endoscopy;  Laterality: N/A;   JOINT REPLACEMENT     2014 lth   LEFT HEART CATH AND CORONARY ANGIOGRAPHY N/A 07/04/2020   Procedure: LEFT HEART CATH AND CORONARY ANGIOGRAPHY possible percutaneous intervention, temporary transvenous temporary wire;  Surgeon: Nelva Bush, MD;  Location: [place] CV LAB;  Service: Cardiovascular;  Laterality: N/A;  left heart cath and placement of temporary transvenous pacing wire   PACEMAKER IMPLANT N/A 07/05/2020   Procedure: PACEMAKER IMPLANT;  Surgeon: Vickie Epley, MD;  Location: [place] CV LAB;  Service: Cardiovascular;  Laterality: N/A;   TEMPORARY PACEMAKER N/A 07/04/2020   Procedure: TEMPORARY PACEMAKER;  Surgeon: Nelva Bush, MD;  Location: [place] CV LAB;  Service: Cardiovascular;  Laterality: N/A;    Current Medications: Current Meds  Medication Sig   acetaminophen (TYLENOL) 500 MG tablet Take 500 mg by mouth every 6 (six) hours as needed.   amLODipine (NORVASC) 2.5 MG tablet Take 2.5 mg by mouth daily.   Calcium Citrate-Vitamin D (CITRACAL + D PO) Take by mouth. Taking 1 daily   cetirizine (ZYRTEC) 10 MG tablet Take 10 mg by mouth as needed for allergies (before remicade  injection).   Cholecalciferol (D3-1000 PO) Take by mouth. Taking 1 daily   Cyanocobalamin (VITAMIN B 12 PO) Take by mouth. Taking 1 daily   famotidine (PEPCID) 20 MG tablet Take 20 mg by mouth daily.   Fluticasone-Umeclidin-Vilant (TRELEGY ELLIPTA IN) Inhale into the lungs.   latanoprost (XALATAN) 0.005 % ophthalmic solution Place 1 drop into both eyes at bedtime.    levothyroxine (SYNTHROID) 50 MCG tablet Take 50 mcg by mouth daily.   psyllium (METAMUCIL) 58.6 % powder Take 1  packet by mouth 3 (three) times daily.   vedolizumab (ENTYVIO) 300 MG injection Inject 300 mg into the vein once a week.     Allergies:   Adhesive [tape], Augmentin [amoxicillin-pot clavulanate], Codeine, Contrast media [iodinated diagnostic agents], Ibuprofen, Iodine, Levaquin [levofloxacin in d5w], and Morphine and related   Social History   Socioeconomic History   Marital status: Widowed    Spouse name: Not on file   Number of children: Not on file   Years of education: Not on file   Highest education level: Not on file  Occupational History   Not on file  Tobacco Use   Smoking status: Former    Packs/day: 0.50    Years: 50.00    Pack years: 25.00    Types: Cigarettes    Quit date: 03/05/2014    Years since quitting: 6.7   Smokeless tobacco: Never  Vaping Use   Vaping Use: Never used  Substance and Sexual Activity   Alcohol use: Not Currently    Comment: occassionally. none last 62month   Drug use: No   Sexual activity: Not on file  Other Topics Concern   Not on file  Social History Narrative   Not on file   Social Determinants of Health   Financial Resource Strain: Not on file  Food Insecurity: Not on file  Transportation Needs: Not on file  Physical Activity: Not on file  Stress: Not on file  Social Connections: Not on file     Family History: The patient's family history includes Heart disease in her mother.  ROS:   Please see the history of present illness.    All other systems reviewed and are negative.  EKGs/Labs/Other Studies Reviewed:    The following studies were reviewed today:  October 05, 2020 device interrogation personally reviewed A. fib burden less than 1% Longest AHRE 1 minute 48 seconds Lead parameter stable  EKG:  The ekg ordered today demonstrates sinus rhythm with a left bundle branch block.  QRS duration 125 ms.  PR interval 184 ms.  November 17, 2020 in clinic device interrogation personally reviewed Lead parameters stable 21%  atrial pacing, less than 1% ventricular pacing No sustained arrhythmias.  Recent Labs: 07/04/2020: ALT 17; B Natriuretic Peptide 78.2; TSH 2.460 07/05/2020: BUN 21; Creatinine, Ser 0.83; Hemoglobin 13.0; Magnesium 2.2; Platelets 247; Potassium 3.9; Sodium 138  Recent Lipid Panel No results found for: CHOL, TRIG, HDL, CHOLHDL, VLDL, LDLCALC, LDLDIRECT  Physical Exam:    VS:  BP 128/70 (BP Location: Left Arm, Patient Position: Sitting, Cuff Size: Normal)   Pulse 63   Ht 5' 2"  (1.575 m)   Wt 124 lb (56.2 kg)   SpO2 92%   BMI 22.68 kg/m     Wt Readings from Last 3 Encounters:  11/17/20 124 lb (56.2 kg)  10/13/20 123 lb (55.8 kg)  07/07/20 126 lb 6.4 oz (57.3 kg)     GEN:  Well nourished, well developed in no acute distress HEENT: Normal  NECK: No JVD; No carotid bruits LYMPHATICS: No lymphadenopathy CARDIAC: RRR, no murmurs, rubs, gallops.  Pacemaker pocket well-healed RESPIRATORY:  Clear to auscultation without rales, wheezing or rhonchi  ABDOMEN: Soft, non-tender, non-distended MUSCULOSKELETAL:  No edema; No deformity  SKIN: Warm and dry.  Bandage on scalp. NEUROLOGIC:  Alert and oriented x 3 PSYCHIATRIC:  Normal affect   ASSESSMENT:    1. Complete heart block ([place])   2. Essential hypertension   3. Cardiac pacemaker in situ    PLAN:    In order of problems listed above:  1. Complete heart block [facility]) Doing well after pacemaker implant.  No further syncopal episodes.  Device healed well.  Device interrogation shows stable lead parameters.  Continue remote monitoring  2. Essential hypertension Controlled.  Continue current regimen.  3. Cardiac pacemaker in situ Device functioning well as above.  Follow-up 1 year in clinic.     Medication Adjustments/Labs and Tests Ordered: Current medicines are reviewed at length with the patient today.  Concerns regarding medicines are outlined above.  Orders Placed This Encounter  Procedures   EKG 12-Lead    No orders of  the defined types were placed in this encounter.    Signed, Lars Mage, MD, [facility], [facility] 11/17/2020 11:38 AM    Electrophysiology [facility]

## 2020-11-17 ENCOUNTER — Other Ambulatory Visit: Payer: Self-pay

## 2020-11-17 ENCOUNTER — Ambulatory Visit (INDEPENDENT_AMBULATORY_CARE_PROVIDER_SITE_OTHER): Payer: Medicare Other | Admitting: Cardiology

## 2020-11-17 ENCOUNTER — Encounter: Payer: Self-pay | Admitting: Cardiology

## 2020-11-17 VITALS — BP 128/70 | HR 63 | Ht 62.0 in | Wt 124.0 lb

## 2020-11-17 DIAGNOSIS — I442 Atrioventricular block, complete: Secondary | ICD-10-CM

## 2020-11-17 DIAGNOSIS — I10 Essential (primary) hypertension: Secondary | ICD-10-CM | POA: Diagnosis not present

## 2020-11-17 DIAGNOSIS — Z950 Presence of cardiac pacemaker: Secondary | ICD-10-CM

## 2020-11-17 NOTE — Patient Instructions (Signed)
Medication Instructions:  Your physician recommends that you continue on your current medications as directed. Please refer to the Current Medication list given to you today. *If you need a refill on your cardiac medications before your next appointment, please call your pharmacy*  Lab Work: None ordered. If you have labs (blood work) drawn today and your tests are completely normal, you will receive your results only by: [place] (if you have MyChart) OR A paper copy in the mail If you have any lab test that is abnormal or we need to change your treatment, we will call you to review the results.  Testing/Procedures: None ordered.  Follow-Up: At [facility], you and your health needs are our priority.  As part of our continuing mission to provide you with exceptional heart care, we have created designated Provider Care Teams.  These Care Teams include your primary Cardiologist (physician) and Advanced Practice Providers (APPs -  Physician Assistants and Nurse Practitioners) who all work together to provide you with the care you need, when you need it.  Your next appointment:   Your physician wants you to follow-up in: one year with Dr. Quentin Ore.   You will receive a reminder letter in the mail two months in advance. If you don't receive a letter, please call our office to schedule the follow-up appointment.  Remote monitoring is used to monitor your Pacemaker from home. This monitoring reduces the number of office visits required to check your device to one time per year. It allows [place] to keep an eye on the functioning of your device to ensure it is working properly. You are scheduled for a device check from home on 01/04/2021. You may send your transmission at any time that day. If you have a wireless device, the transmission will be sent automatically. After your physician reviews your transmission, you will receive a postcard with your next transmission date.

## 2020-12-23 DIAGNOSIS — I639 Cerebral infarction, unspecified: Secondary | ICD-10-CM

## 2020-12-23 HISTORY — DX: Cerebral infarction, unspecified: I63.9

## 2021-01-04 ENCOUNTER — Ambulatory Visit (INDEPENDENT_AMBULATORY_CARE_PROVIDER_SITE_OTHER): Payer: Medicare Other

## 2021-01-04 ENCOUNTER — Emergency Department
Admission: EM | Admit: 2021-01-04 | Discharge: 2021-01-04 | Disposition: A | Discharge status: Home / Self Care | Payer: Medicare Other | Attending: Emergency Medicine | Admitting: Emergency Medicine

## 2021-01-04 DIAGNOSIS — Z5321 Procedure and treatment not carried out due to patient leaving prior to being seen by health care provider: Secondary | ICD-10-CM | POA: Diagnosis not present

## 2021-01-04 DIAGNOSIS — I442 Atrioventricular block, complete: Secondary | ICD-10-CM

## 2021-01-04 DIAGNOSIS — R251 Tremor, unspecified: Secondary | ICD-10-CM | POA: Insufficient documentation

## 2021-01-04 NOTE — ED Triage Notes (Signed)
Uncontrolled movement/tremors, pt has no co of pain , symptoms x 1 week , worse today , pt states she is unable to stay still , pt states no new medications

## 2021-01-05 ENCOUNTER — Ambulatory Visit
Admission: RE | Admit: 2021-01-05 | Discharge: 2021-01-05 | Disposition: A | Discharge status: Home / Self Care | Payer: Medicare Other | Source: Ambulatory Visit | Attending: Physician Assistant | Admitting: Physician Assistant

## 2021-01-05 ENCOUNTER — Other Ambulatory Visit: Payer: Self-pay | Admitting: Physician Assistant

## 2021-01-05 ENCOUNTER — Other Ambulatory Visit: Payer: Self-pay

## 2021-01-05 DIAGNOSIS — R259 Unspecified abnormal involuntary movements: Secondary | ICD-10-CM | POA: Diagnosis not present

## 2021-01-05 LAB — CUP PACEART REMOTE DEVICE CHECK
Battery Remaining Longevity: 125 mo
Battery Remaining Percentage: 95.5 %
Battery Voltage: 3.01 V
Brady Statistic AP VP Percent: 1 %
Brady Statistic AP VS Percent: 30 %
Brady Statistic AS VP Percent: 1 %
Brady Statistic AS VS Percent: 70 %
Brady Statistic RA Percent Paced: 30 %
Brady Statistic RV Percent Paced: 1 %
Date Time Interrogation Session: 20220914032554
Implantable Lead Implant Date: 20220314
Implantable Lead Implant Date: 20220314
Implantable Lead Location: 753859
Implantable Lead Location: 753860
Implantable Pulse Generator Implant Date: 20220314
Lead Channel Impedance Value: 490 Ohm
Lead Channel Impedance Value: 540 Ohm
Lead Channel Pacing Threshold Amplitude: 0.875 V
Lead Channel Pacing Threshold Amplitude: 1.125 V
Lead Channel Pacing Threshold Pulse Width: 0.5 ms
Lead Channel Pacing Threshold Pulse Width: 0.5 ms
Lead Channel Sensing Intrinsic Amplitude: 12 mV
Lead Channel Sensing Intrinsic Amplitude: 3.7 mV
Lead Channel Setting Pacing Amplitude: 1.125
Lead Channel Setting Pacing Amplitude: 2.125
Lead Channel Setting Pacing Pulse Width: 0.5 ms
Lead Channel Setting Sensing Sensitivity: 2 mV
Pulse Gen Model: 2272
Pulse Gen Serial Number: 6395904

## 2021-01-06 ENCOUNTER — Ambulatory Visit: Payer: Medicare Other

## 2021-01-12 ENCOUNTER — Ambulatory Visit: Payer: Medicare Other | Admitting: Internal Medicine

## 2021-01-12 NOTE — Progress Notes (Signed)
Remote pacemaker transmission.   

## 2021-01-13 ENCOUNTER — Other Ambulatory Visit (HOSPITAL_COMMUNITY): Payer: Self-pay | Admitting: Neurology

## 2021-01-13 ENCOUNTER — Other Ambulatory Visit: Payer: Self-pay

## 2021-01-13 ENCOUNTER — Ambulatory Visit
Admission: RE | Admit: 2021-01-13 | Discharge: 2021-01-13 | Disposition: A | Discharge status: Home / Self Care | Payer: Medicare Other | Source: Ambulatory Visit | Attending: Acute Care | Admitting: Acute Care

## 2021-01-13 DIAGNOSIS — Z122 Encounter for screening for malignant neoplasm of respiratory organs: Secondary | ICD-10-CM | POA: Diagnosis present

## 2021-01-13 DIAGNOSIS — Z87891 Personal history of nicotine dependence: Secondary | ICD-10-CM | POA: Insufficient documentation

## 2021-01-13 DIAGNOSIS — R2990 Unspecified symptoms and signs involving the nervous system: Secondary | ICD-10-CM

## 2021-01-17 ENCOUNTER — Telehealth: Payer: Self-pay | Admitting: Acute Care

## 2021-01-17 ENCOUNTER — Ambulatory Visit: Payer: Medicare Other

## 2021-01-17 NOTE — Telephone Encounter (Signed)
Call report on LDCT 01/13/21    IMPRESSION: 1. As discussed above, a area of probable evolving post infectious or inflammatory scarring in the right upper lobe with adjacent pleural based nodule in the apex of the right upper lobe which is only slightly different than the prior study. This is favored to be benign, but categorized as Lung-RADS 4AS, suspicious. Follow up low-dose chest CT without contrast in 3 months (please use the following order, "CT CHEST LCS NODULE FOLLOW-UP W/O CM") is recommended. Alternatively, PET may be considered when there is a solid component 28m or larger. 2. The "S" modifier above refers to potentially clinically significant non lung cancer related findings. Specifically, there is aortic atherosclerosis, in addition to left main and left anterior descending coronary artery disease. Please note that although the presence of coronary artery calcium documents the presence of coronary artery disease, the severity of this disease and any potential stenosis cannot be assessed on this non-gated CT examination. Assessment for potential risk factor modification, dietary therapy or pharmacologic therapy may be warranted, if clinically indicated. 3. Mild diffuse bronchial wall thickening with severe centrilobular and paraseptal emphysema; imaging findings suggestive of underlying COPD.   These results will be called to the ordering clinician or representative by the Radiologist Assistant, and communication documented in the PACS or [employer]   Aortic Atherosclerosis (ICD10-I70.0) and Emphysema (ICD10-J43.9).     Electronically Signed   By: DVinnie LangtonM.D.   On: 01/15/2021 07:41  Forwarding to SJudson Roch thanks

## 2021-01-19 ENCOUNTER — Ambulatory Visit (INDEPENDENT_AMBULATORY_CARE_PROVIDER_SITE_OTHER): Payer: Medicare Other | Admitting: Pulmonary Disease

## 2021-01-19 ENCOUNTER — Other Ambulatory Visit: Payer: Self-pay

## 2021-01-19 ENCOUNTER — Encounter: Payer: Self-pay | Admitting: Pulmonary Disease

## 2021-01-19 VITALS — BP 122/82 | HR 62 | Temp 98.3°F | Ht 62.0 in | Wt 127.8 lb

## 2021-01-19 DIAGNOSIS — K219 Gastro-esophageal reflux disease without esophagitis: Secondary | ICD-10-CM

## 2021-01-19 DIAGNOSIS — Z87891 Personal history of nicotine dependence: Secondary | ICD-10-CM

## 2021-01-19 DIAGNOSIS — Z122 Encounter for screening for malignant neoplasm of respiratory organs: Secondary | ICD-10-CM

## 2021-01-19 DIAGNOSIS — J449 Chronic obstructive pulmonary disease, unspecified: Secondary | ICD-10-CM | POA: Diagnosis not present

## 2021-01-19 MED ORDER — PANTOPRAZOLE SODIUM 40 MG PO TBEC: 40.0000 mg | DELAYED_RELEASE_TABLET | Freq: Every day | ORAL | 2 refills | Status: AC

## 2021-01-19 NOTE — Patient Instructions (Signed)
We are getting your CT reschedule as you requested this will still be done at the [facility] location  We are giving you a trial of pantoprazole (Protonix) for your reflux.  We discussed antireflux measures.  We will see him in follow-up after your CT scan of the chest is done approximately 3 months time.  Call with any questions or concerns.

## 2021-01-19 NOTE — Progress Notes (Addendum)
Subjective:    Patient ID: Sheila Peters, female    DOB: 15-Nov-1940, 80 y.o.   MRN: [id number] Chief Complaint  Patient presents with   Follow-up    Copd-    HPI Analycia is a 80 year old former smoker (quit 2015) who usually follows with Dr. Santiago Glad.  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  We are seeing her today because of abnormalities noted on a low-dose lung cancer screening CT performed on 13 January 2021.  Recall that the patient had had her first abnormal lung cancer screening on 13 October 2020.  At that time however the patient was symptomatic with a lower respiratory tract infection.  The findings showed potentially evolving postinfectious changes and a repeat CT was recommended in 3 months.  She had follow-up as noted above and it shows some persistent changes that still appear postinfectious.  The patient has stage III COPD and is on Trelegy.  He does not note issues with increasing dyspnea since she had her pacemaker for complete heart block.  She notes that the pacemaker has been the biggest change in her wellbeing.  She does endorse a history of significant gastroesophageal reflux.  She is on famotidine and notes that she has been having some breakthrough reflux and that is her main complaint today.  She sleeps on her right side and notes that frequently she regurgitates at nighttime.  She does go to bed approximately 3 hours after the last large meal of the day however, she does describe that she sometimes takes a "snack" 1 hour before going to bed.  I suspect that the changes noted on her CT are related to aspiration pneumonitis given her habit of sleeping on the right side and her poorly controlled reflux symptoms.  She does have a small hiatal hernia but not of much consequence.  DATA: 01/14/2016 PFTs: FEV1 0.97 L or 49% predicted, FVC 1.99 L or 75% predicted, FEV1 FVC 49% assistant with COPD stage III.  Review of Systems A 10 point review of systems was  performed and it is as noted above otherwise negative.  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   COPD (chronic obstructive pulmonary disease) (HCC)    GERD (gastroesophageal reflux disease)    Hypothyroidism    Ulcerative colitis, chronic (HCC)    HTN (hypertension)    Fall at home, initial encounter    Leukocytosis    Swelling of limb 10/10/2016   Pain in limb 10/10/2016   Varicose veins of leg with pain, left 10/10/2016   Abnormal CT scan of lung 10/29/2015   Personal history of tobacco use, presenting hazards to health 11/04/2014   Preop cardiovascular exam 09/11/2011   SOB (shortness of breath) 08/21/2011   Chest pain 08/21/2011   PVC's (premature ventricular contractions) 08/21/2011   Social History   Tobacco Use   Smoking status: Former    Packs/day: 0.50    Years: 50.00    Pack years: 25.00    Types: Cigarettes    Quit date: 03/05/2014    Years since quitting: 6.8   Smokeless tobacco: Never  Substance Use Topics   Alcohol use: Not Currently    Comment: occassionally. none last 6months   Allergies  Allergen Reactions   Adhesive [Tape]     Tears skin   Augmentin [Amoxicillin-Pot Clavulanate] Nausea Only  Dizziness   Codeine Nausea Only    "dizzy"   Contrast Media [Iodinated Diagnostic Agents] Hives   Ibuprofen Hives and Swelling   Iodine Other (See Comments)   Levaquin [Levofloxacin In D5w] Nausea Only   Morphine And Related Nausea Only    dizziness   Current Meds  Medication Sig   acetaminophen (TYLENOL) 500 MG tablet Take 500 mg by mouth every 6 (six) hours as needed.   amLODipine (NORVASC) 2.5 MG tablet Take 2.5 mg by mouth daily.   Calcium Citrate-Vitamin D (CITRACAL + D PO) Take by mouth. Taking 1 daily   cetirizine (ZYRTEC) 10 MG tablet Take 10 mg by mouth as needed for allergies (before remicade injection).    Cholecalciferol (D3-1000 PO) Take by mouth. Taking 1 daily   Cyanocobalamin (VITAMIN B 12 PO) Take by mouth. Taking 1 daily   famotidine (PEPCID) 20 MG tablet Take 20 mg by mouth daily.   Fluticasone-Umeclidin-Vilant (TRELEGY ELLIPTA IN) Inhale into the lungs.   latanoprost (XALATAN) 0.005 % ophthalmic solution Place 1 drop into both eyes at bedtime.    levothyroxine (SYNTHROID) 50 MCG tablet Take 50 mcg by mouth daily.   pantoprazole (PROTONIX) 40 MG tablet Take 1 tablet (40 mg total) by mouth daily.   psyllium (METAMUCIL) 58.6 % powder Take 1 packet by mouth 3 (three) times daily.   vedolizumab (ENTYVIO) 300 MG injection Inject 300 mg into the vein once a week.   Immunization History  Administered Date(s) Administered   Influenza Inj Mdck Quad Pf 01/17/2018, 01/21/2019   Influenza,inj,Quad PF,6+ Mos 01/18/2016, 02/03/2020   Influenza-Unspecified 01/19/2012, 02/14/2013, 01/02/2014, 01/17/2016   Moderna Sars-Covid-2 Vaccination 06/11/2019, 07/09/2019, 03/15/2020   PPD Test 03/26/2017   Pneumococcal Conjugate-13 11/28/2016   Pneumococcal Polysaccharide-23 04/11/2011       Objective:   Physical Exam BP 122/82 (BP Location: Left Arm, Patient Position: Sitting, Cuff Size: Normal)   Pulse 62   Temp 98.3 F (36.8 C) (Oral)   Ht 5\' 2"  (1.575 m)   Wt 127 lb 12.8 oz (58 kg)   SpO2 93%   BMI 23.37 kg/m  GENERAL: Awake, alert, no acute distress.  Mild chronic use of accessories.  Very well-groomed.  Fully ambulatory. HEAD: Normocephalic, atraumatic.  EYES: Pupils equal, round, reactive to light.  No scleral icterus.  MOUTH: Masking requirements. NECK: Supple. No thyromegaly. Trachea midline. No JVD.  No adenopathy. PULMONARY: Increased AP diameter.  Chronic use of accessories.  Distant breath sounds bilaterally, no adventitious sounds. CARDIOVASCULAR: S1 and S2. Regular rate and rhythm.  GASTROINTESTINAL: No abdominal distention. MUSCULOSKELETAL: No joint deformity, no clubbing, no  edema.  NEUROLOGIC: No focal deficits noted, gait steady, speech fluent. SKIN: Intact,warm,dry.  Limited exam, no rashes.   PSYCH: Mood and behavior normal.   Representative image of CT scan performed 13 October 2020   Representative image of the CT performed 13 January 2021:   Both images shown to the patient.  Assessment & Plan:     ICD-10-CM   1. Stage 3 severe COPD by GOLD classification (HCC)  J44.9    Continue Trelegy Ellipta Continue as needed albuterol    2. 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 Screening for malignant neoplasm of respiratory organ  Z12.2    Follow-up LDCT 3 months    4. Former smoker  [phone]  No evidence of relapse     Meds ordered this encounter  Medications   pantoprazole (PROTONIX) 40 MG tablet    Sig: Take 1 tablet (40 mg total) by mouth daily.    Dispense:  30 tablet    Refill:  2   The patient's findings on CT are consistent with potential chronic silent aspiration the patient has a tendency to sleep on her right side and has significant reflux to the point of noted regurgitant material in her throat.  We will treat her with PPI, if her symptoms persist she will need to see gastroenterology.  With regards to the findings on CT we will repeat low-dose CT in 3 months.  She will be followed up after her CT scan is performed.  She is to call us with any questions or concerns in the interim.  Gailen Shelter, MD Advanced Bronchoscopy PCCM [facility]    *This note was dictated using voice recognition software/Dragon.  Despite best efforts to proofread, errors can occur which can change the meaning.  Any change was purely unintentional.

## 2021-01-21 ENCOUNTER — Encounter: Payer: Self-pay | Admitting: Pulmonary Disease

## 2021-01-21 NOTE — Progress Notes (Signed)
  These results were shared with the patient 9/30 at an OV with Dr. Patsey Berthold. She has  findings on the right that appear to be inflammatory.  She appears to be chronically aspirating.  Dr. Patsey Berthold has addressed the issue  of silent aspiration and plans to see the patient in follow up  after her 3 month scan. She just needs 1 more follow-up at 3 months . Langley Gauss , please fax results to PCP and place order for 3 month follow up.  Thanks so much.

## 2021-01-24 ENCOUNTER — Other Ambulatory Visit: Payer: Self-pay | Admitting: *Deleted

## 2021-01-24 ENCOUNTER — Telehealth: Payer: Self-pay | Admitting: Acute Care

## 2021-01-24 DIAGNOSIS — R911 Solitary pulmonary nodule: Secondary | ICD-10-CM

## 2021-01-24 DIAGNOSIS — Z87891 Personal history of nicotine dependence: Secondary | ICD-10-CM

## 2021-01-24 NOTE — Telephone Encounter (Signed)
I attempted to place order for a 3 mth f/u Chest CT Nodule f/u CT but it was blocked due to pt's current age of 33. Do you want to order a Chest CT without contrast instead?

## 2021-01-24 NOTE — Telephone Encounter (Signed)
Order placed fot 3 mth f/u Chest CT w/o contrast. Pt will be called closer to that time to schedule.

## 2021-02-25 ENCOUNTER — Encounter (HOSPITAL_COMMUNITY): Payer: Self-pay

## 2021-02-25 ENCOUNTER — Ambulatory Visit (HOSPITAL_COMMUNITY): Payer: Medicare Other

## 2021-04-05 ENCOUNTER — Ambulatory Visit (INDEPENDENT_AMBULATORY_CARE_PROVIDER_SITE_OTHER): Payer: Medicare Other

## 2021-04-05 DIAGNOSIS — I442 Atrioventricular block, complete: Secondary | ICD-10-CM | POA: Diagnosis not present

## 2021-04-06 LAB — CUP PACEART REMOTE DEVICE CHECK
Battery Remaining Longevity: 124 mo
Battery Remaining Percentage: 95.5 %
Battery Voltage: 3.01 V
Brady Statistic AP VP Percent: 1 %
Brady Statistic AP VS Percent: 31 %
Brady Statistic AS VP Percent: 1 %
Brady Statistic AS VS Percent: 69 %
Brady Statistic RA Percent Paced: 31 %
Brady Statistic RV Percent Paced: 1 %
Date Time Interrogation Session: 20221214030425
Implantable Lead Implant Date: 20220314
Implantable Lead Implant Date: 20220314
Implantable Lead Location: 753859
Implantable Lead Location: 753860
Implantable Pulse Generator Implant Date: 20220314
Lead Channel Impedance Value: 490 Ohm
Lead Channel Impedance Value: 530 Ohm
Lead Channel Pacing Threshold Amplitude: 0.875 V
Lead Channel Pacing Threshold Amplitude: 1 V
Lead Channel Pacing Threshold Pulse Width: 0.5 ms
Lead Channel Pacing Threshold Pulse Width: 0.5 ms
Lead Channel Sensing Intrinsic Amplitude: 12 mV
Lead Channel Sensing Intrinsic Amplitude: 3.6 mV
Lead Channel Setting Pacing Amplitude: 1.125
Lead Channel Setting Pacing Amplitude: 2 V
Lead Channel Setting Pacing Pulse Width: 0.5 ms
Lead Channel Setting Sensing Sensitivity: 2 mV
Pulse Gen Model: 2272
Pulse Gen Serial Number: 6395904

## 2021-04-15 NOTE — Progress Notes (Signed)
Remote pacemaker transmission.   

## 2021-05-06 ENCOUNTER — Other Ambulatory Visit: Payer: Self-pay | Admitting: Acute Care

## 2021-05-06 DIAGNOSIS — R9389 Abnormal findings on diagnostic imaging of other specified body structures: Secondary | ICD-10-CM

## 2021-05-06 DIAGNOSIS — R911 Solitary pulmonary nodule: Secondary | ICD-10-CM

## 2021-05-06 DIAGNOSIS — R0789 Other chest pain: Secondary | ICD-10-CM

## 2021-05-06 DIAGNOSIS — Z87891 Personal history of nicotine dependence: Secondary | ICD-10-CM

## 2021-05-20 ENCOUNTER — Ambulatory Visit
Admission: RE | Admit: 2021-05-20 | Discharge: 2021-05-20 | Disposition: A | Discharge status: Home / Self Care | Payer: Medicare Other | Source: Ambulatory Visit | Attending: Acute Care | Admitting: Acute Care

## 2021-05-20 ENCOUNTER — Other Ambulatory Visit: Payer: Self-pay

## 2021-05-20 DIAGNOSIS — R911 Solitary pulmonary nodule: Secondary | ICD-10-CM | POA: Insufficient documentation

## 2021-07-05 ENCOUNTER — Ambulatory Visit (INDEPENDENT_AMBULATORY_CARE_PROVIDER_SITE_OTHER): Payer: Medicare Other

## 2021-07-05 DIAGNOSIS — I442 Atrioventricular block, complete: Secondary | ICD-10-CM | POA: Diagnosis not present

## 2021-07-06 LAB — CUP PACEART REMOTE DEVICE CHECK
Battery Remaining Longevity: 121 mo
Battery Remaining Percentage: 95 %
Battery Voltage: 3.01 V
Brady Statistic AP VP Percent: 1 %
Brady Statistic AP VS Percent: 32 %
Brady Statistic AS VP Percent: 1 %
Brady Statistic AS VS Percent: 67 %
Brady Statistic RA Percent Paced: 32 %
Brady Statistic RV Percent Paced: 1 %
Date Time Interrogation Session: 20230315024153
Implantable Lead Implant Date: 20220314
Implantable Lead Implant Date: 20220314
Implantable Lead Location: 753859
Implantable Lead Location: 753860
Implantable Pulse Generator Implant Date: 20220314
Lead Channel Impedance Value: 480 Ohm
Lead Channel Impedance Value: 550 Ohm
Lead Channel Pacing Threshold Amplitude: 1 V
Lead Channel Pacing Threshold Amplitude: 1 V
Lead Channel Pacing Threshold Pulse Width: 0.5 ms
Lead Channel Pacing Threshold Pulse Width: 0.5 ms
Lead Channel Sensing Intrinsic Amplitude: 1.9 mV
Lead Channel Sensing Intrinsic Amplitude: 12 mV
Lead Channel Setting Pacing Amplitude: 1.25 V
Lead Channel Setting Pacing Amplitude: 2 V
Lead Channel Setting Pacing Pulse Width: 0.5 ms
Lead Channel Setting Sensing Sensitivity: 2 mV
Pulse Gen Model: 2272
Pulse Gen Serial Number: 6395904

## 2021-07-19 NOTE — Progress Notes (Signed)
Remote pacemaker transmission.   

## 2021-07-21 ENCOUNTER — Other Ambulatory Visit: Payer: Self-pay | Admitting: *Deleted

## 2021-07-21 ENCOUNTER — Other Ambulatory Visit: Payer: Self-pay | Admitting: Acute Care

## 2021-07-21 DIAGNOSIS — R911 Solitary pulmonary nodule: Secondary | ICD-10-CM

## 2021-07-21 DIAGNOSIS — Z87891 Personal history of nicotine dependence: Secondary | ICD-10-CM

## 2021-07-22 ENCOUNTER — Telehealth: Payer: Self-pay | Admitting: Acute Care

## 2021-07-22 NOTE — Telephone Encounter (Signed)
I called the patient 07/21/2021 to review the results of her CT Chest without Contrast . She has reviewed the scan on line. We discussed that the scan showed a stable nodular area  in the apex of the right hemithorax similar to the prior study, compatible with a benign area of post infectious or inflammatory scarring.Plan is for a 1 year follow up. Patient and Dr. Patsey Berthold are in agreement with this. She has also been scheduled with Dr. Patsey Berthold for 6 month check up as she is overdue.  ?Scan has been ordered. Appointment has been made. Langley Gauss, please share results with PCP, and let them know plan of care. Thanks so much ?

## 2021-07-25 NOTE — Telephone Encounter (Signed)
Results routed to PCP per Eric Form, NP ?

## 2021-07-29 ENCOUNTER — Ambulatory Visit: Payer: Medicare Other

## 2021-09-22 ENCOUNTER — Ambulatory Visit (INDEPENDENT_AMBULATORY_CARE_PROVIDER_SITE_OTHER): Payer: Medicare Other | Admitting: Pulmonary Disease

## 2021-09-22 ENCOUNTER — Encounter: Payer: Self-pay | Admitting: Pulmonary Disease

## 2021-09-22 VITALS — BP 130/74 | HR 70 | Temp 97.8°F | Ht 62.0 in | Wt 127.8 lb

## 2021-09-22 DIAGNOSIS — K219 Gastro-esophageal reflux disease without esophagitis: Secondary | ICD-10-CM | POA: Diagnosis not present

## 2021-09-22 DIAGNOSIS — J449 Chronic obstructive pulmonary disease, unspecified: Secondary | ICD-10-CM

## 2021-09-22 DIAGNOSIS — I442 Atrioventricular block, complete: Secondary | ICD-10-CM

## 2021-09-22 MED ORDER — TRELEGY ELLIPTA 200-62.5-25 MCG/ACT IN AEPB: 1.0000 | INHALATION_SPRAY | Freq: Every day | RESPIRATORY_TRACT | 0 refills | Status: DC

## 2021-09-22 NOTE — Patient Instructions (Signed)
Continue using your Trelegy.  We will see him in follow-up in 5 months time call sooner should any new problems arise.

## 2021-09-22 NOTE — Progress Notes (Incomplete)
Subjective:    Patient ID: Sheila Peters, female    DOB: [birth year], 81 y.o.   MRN: [id number] Patient Care Team: Tracie Harrier, MD as PCP - General (Internal Medicine) Wellington Hampshire, MD as PCP - Cardiology (Cardiology) Vickie Epley, MD as PCP - Electrophysiology (Cardiology) Tracie Harrier, MD (Internal Medicine)  Chief Complaint  Patient presents with  . Follow-up    SOB with exertion and occ non prod cough.     HPI  DATA 01/14/2016 PFTs: FEV1 0.97 L or 49% predicted, FVC 1.99 L or 75% predicted, FEV1 FVC 49% consistent with COPD stage II-III. 01/13/2021 LDCT:area of probable evolving post infectious or inflammatory scarring in the right upper lobe with adjacent pleural based nodule in the apex of the right upper lobe which is only slightly different than the prior study. This is favored to be benign, but categorized as Lung-RADS 4AS, suspicious. Follow up low-dose chest CT without contrast in 3 months  05/20/2021 LDCT:The nodular area of concern in the apex of the right hemithorax is similar to the prior study, compatible with a benign area of post infectious or inflammatory scarring. No new suspicious findings are noted on today's examination.  Review of Systems A 10 point review of systems was performed and it is as noted above otherwise negative.  Patient Active Problem List   Diagnosis Date Noted  . Pacemaker 07/05/2020  . Syncope 07/04/2020  . Complete heart block ([place]) 07/04/2020  . 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 COPD (chronic obstructive pulmonary disease) ([place])   . GERD (gastroesophageal reflux disease)   . Hypothyroidism   . Ulcerative colitis, chronic ([place])   . HTN (hypertension)   . Fall at home, initial encounter   . Leukocytosis   . Swelling of limb 10/10/2016  . Pain in limb 10/10/2016  . Varicose veins of leg with pain, left 10/10/2016  . Abnormal CT scan of lung 10/29/2015  . Personal history of tobacco use, presenting  hazards to health 11/04/2014  . Preop cardiovascular exam 09/11/2011  . SOB (shortness of breath) 08/21/2011  . Chest pain 08/21/2011  . PVC's (premature ventricular contractions) 08/21/2011   Social History   Tobacco Use  . Smoking status: Former    Packs/day: 0.50    Years: 50.00    Pack years: 25.00    Types: Cigarettes    Quit date: 03/05/2014    Years since quitting: 7.5  . Smokeless tobacco: Never  Substance Use Topics  . Alcohol use: Not Currently    Comment: occassionally. none last 60month   Allergies  Allergen Reactions  . Adhesive [Tape]     Tears skin  . Augmentin [Amoxicillin-Pot Clavulanate] Nausea Only    Dizziness  . Codeine Nausea Only    "dizzy"  . Contrast Media [Iodinated Contrast Media] Hives  . Ibuprofen Hives and Swelling  . Iodine Other (See Comments)  . Levaquin [Levofloxacin In D5w] Nausea Only  . Morphine And Related Nausea Only    dizziness   Current Meds  Medication Sig  . acetaminophen (TYLENOL) 500 MG tablet Take 500 mg by mouth every 6 (six) hours as needed.  .Marland KitchenamLODipine (NORVASC) 2.5 MG tablet Take 2.5 mg by mouth daily.  . Calcium Citrate-Vitamin D (CITRACAL + D PO) Take by mouth. Taking 1 daily  . Cholecalciferol (D3-1000 PO) Take by mouth. Taking 1 daily  . Cyanocobalamin (VITAMIN B 12 PO) Take by mouth. Taking 1 daily  . Fluticasone-Umeclidin-Vilant (TRELEGY ELLIPTA) 200-62.5-25 MCG/ACT AEPB Inhale  1 puff into the lungs daily.  Marland Kitchen latanoprost (XALATAN) 0.005 % ophthalmic solution Place 1 drop into both eyes at bedtime.   Marland Kitchen levothyroxine (SYNTHROID) 50 MCG tablet Take 50 mcg by mouth daily.  . pantoprazole (PROTONIX) 40 MG tablet Take 1 tablet (40 mg total) by mouth daily.  . psyllium (METAMUCIL) 58.6 % powder Take 1 packet by mouth 3 (three) times daily.  . vedolizumab (ENTYVIO) 300 MG injection Inject 300 mg into the vein once a week.  . [DISCONTINUED] cetirizine (ZYRTEC) 10 MG tablet Take 10 mg by mouth as needed for allergies  (before remicade injection).  . [DISCONTINUED] famotidine (PEPCID) 20 MG tablet Take 20 mg by mouth daily.  . [DISCONTINUED] Fluticasone-Umeclidin-Vilant (TRELEGY ELLIPTA IN) Inhale into the lungs.   Immunization History  Administered Date(s) Administered  . Influenza Inj Mdck Quad Pf 01/17/2018, 01/21/2019, 02/08/2021  . Influenza,inj,Quad PF,6+ Mos 01/18/2016, 02/03/2020  . Influenza-Unspecified 01/19/2012, 02/14/2013, 01/02/2014, 01/17/2016  . Moderna Sars-Covid-2 Vaccination 06/11/2019, 07/09/2019, 03/15/2020  . PPD Test 03/26/2017  . Pneumococcal Conjugate-13 11/28/2016  . Pneumococcal Polysaccharide-23 04/11/2011       Objective:   Physical Exam BP 130/74 (BP Location: Left Arm, Cuff Size: Normal)   Pulse 70   Temp 97.8 F (36.6 C) (Temporal)   Ht 5' 2"  (1.575 m)   Wt 127 lb 12.8 oz (58 kg)   SpO2 94%   BMI 23.37 kg/m  GENERAL: HEAD: Normocephalic, atraumatic.  EYES: Pupils equal, round, reactive to light.  No scleral icterus.  MOUTH:  NECK: Supple. No thyromegaly. Trachea midline. No JVD.  No adenopathy. PULMONARY: Good air entry bilaterally.  No adventitious sounds. CARDIOVASCULAR: S1 and S2. Regular rate and rhythm.  ABDOMEN: MUSCULOSKELETAL: No joint deformity, no clubbing, no edema.  NEUROLOGIC:  SKIN: Intact,warm,dry. PSYCH:       Assessment & Plan:

## 2021-10-04 ENCOUNTER — Ambulatory Visit (INDEPENDENT_AMBULATORY_CARE_PROVIDER_SITE_OTHER): Payer: Medicare Other

## 2021-10-04 DIAGNOSIS — Z950 Presence of cardiac pacemaker: Secondary | ICD-10-CM | POA: Diagnosis not present

## 2021-10-04 DIAGNOSIS — I442 Atrioventricular block, complete: Secondary | ICD-10-CM

## 2021-10-07 LAB — CUP PACEART REMOTE DEVICE CHECK
Battery Remaining Longevity: 117 mo
Battery Remaining Percentage: 93 %
Battery Voltage: 3.01 V
Brady Statistic AP VP Percent: 1 %
Brady Statistic AP VS Percent: 33 %
Brady Statistic AS VP Percent: 1.6 %
Brady Statistic AS VS Percent: 65 %
Brady Statistic RA Percent Paced: 33 %
Brady Statistic RV Percent Paced: 2.6 %
Date Time Interrogation Session: 20230614032353
Implantable Lead Implant Date: 20220314
Implantable Lead Implant Date: 20220314
Implantable Lead Location: 753859
Implantable Lead Location: 753860
Implantable Pulse Generator Implant Date: 20220314
Lead Channel Impedance Value: 490 Ohm
Lead Channel Impedance Value: 550 Ohm
Lead Channel Pacing Threshold Amplitude: 1 V
Lead Channel Pacing Threshold Amplitude: 1 V
Lead Channel Pacing Threshold Pulse Width: 0.5 ms
Lead Channel Pacing Threshold Pulse Width: 0.5 ms
Lead Channel Sensing Intrinsic Amplitude: 2.6 mV
Lead Channel Sensing Intrinsic Amplitude: 3.6 mV
Lead Channel Setting Pacing Amplitude: 1.25 V
Lead Channel Setting Pacing Amplitude: 2 V
Lead Channel Setting Pacing Pulse Width: 0.5 ms
Lead Channel Setting Sensing Sensitivity: 2 mV
Pulse Gen Model: 2272
Pulse Gen Serial Number: 6395904

## 2021-10-18 ENCOUNTER — Encounter: Payer: Self-pay | Admitting: Pulmonary Disease

## 2021-11-15 NOTE — Progress Notes (Unsigned)
Electrophysiology Office Follow up Visit Note:    Date:  11/16/2021   ID:  Sheila Peters, Sheila Peters 06/01/1940, MRN [id number]  PCP:  Tracie Harrier, MD  [facility] Cardiologist:  Kathlyn Sacramento, MD  [facility] Electrophysiologist:  Vickie Epley, MD    Interval History:    Sheila Peters is a 81 y.o. female who presents for a follow up visit. They were last seen in clinic November 17, 2020 for history of complete heart block post permanent pacemaker implant July 05, 2020.  She is with family today in clinic.  She has been doing well.  No issues with her pacemaker.       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 complication ([place])     Past Surgical History:  Procedure Laterality Date   ABDOMINAL HYSTERECTOMY     ANKLE SURGERY     left fracture   BACK SURGERY     herniated disk   CATARACT EXTRACTION W/PHACO Right 10/23/2018   Procedure: CATARACT EXTRACTION PHACO AND INTRAOCULAR LENS PLACEMENT ([place]) RIGHT;  Surgeon: Leandrew Koyanagi, MD;  Location: [place] [facility];  Service: Ophthalmology;  Laterality: Right;  PT WANTS TO BE LAST CASE   COLONOSCOPY N/A 02/26/2017   Procedure: COLONOSCOPY;  Surgeon: Manya Silvas, MD;  Location: [facility] ENDOSCOPY;  Service: Endoscopy;  Laterality: N/A;   COLONOSCOPY WITH PROPOFOL N/A 11/02/2014   Procedure: COLONOSCOPY WITH PROPOFOL;  Surgeon: Josefine Class, MD;  Location: [facility] ENDOSCOPY;  Service: Endoscopy;  Laterality: N/A;   excision of thyroid nodule     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   Procedure: FECAL TRANSPLANT;  Surgeon: Manya Silvas, MD;  Location[facility]OSCOPY;  Service: Endoscopy;  Laterality: N/A;   JOINT REPLACEMENT     2014 lth   LEFT HEART CATH AND CORONARY ANGIOGRAPHY N/A 07/04/2020   Procedure: LEFT HEART CATH AND CORONARY ANGIOGRAPHY possible percutaneous intervention, temporary transvenous temporary wire;  Surgeon: Nelva Bush, MD;  Location: [facility];  Service: Cardiovascular;  Laterality: N/A;  left heart cath and placement of temporary transvenous pacing wire   PACEMAKER IMPLANT N/A 07/05/2020   Procedure: PACEMAKER IMPLANT;  Surgeon: Vickie Epley, MD;  Location: [place] [facility];  Service: Cardiovascular;  Laterality: N/A;   TEMPORARY PACEMAKER N/A 07/04/2020   Procedure: TEMPORARY PACEMAKER;  Surgeon: Nelva Bush, MD;  Location: [place] [facility];  Service: Cardiovascular;  Laterality: N/A;    Current Medications: Current Meds  Medication Sig   acetaminophen (TYLENOL) 500 MG tablet Take 500 mg by mouth every 6 (six) hours as needed.   amLODipine (NORVASC) 2.5 MG tablet Take 2.5 mg by mouth daily.   Calcium Citrate-Vitamin D (CITRACAL + D PO) Take by mouth. Taking 1 daily   Cholecalciferol (D3-1000 PO) Take by mouth. Taking 1 daily   Cyanocobalamin (VITAMIN B 12 PO) Take by mouth. Taking 1 daily   Fluticasone-Umeclidin-Vilant (TRELEGY ELLIPTA) 200-62.5-25 MCG/ACT AEPB Inhale 1 puff into the lungs daily.   latanoprost (  XALATAN) 0.005 % ophthalmic solution Place 1 drop into both eyes at bedtime.    levothyroxine (SYNTHROID) 50 MCG tablet Take 50 mcg by mouth daily.   pantoprazole (PROTONIX) 40 MG tablet Take 1 tablet (40 mg total) by mouth daily.   psyllium (METAMUCIL) 58.6 % powder Take 1 packet by mouth 3 (three) times daily.   rosuvastatin (CRESTOR) 5 MG tablet Take 5 mg by mouth daily.     Allergies:   Adhesive [tape], Augmentin [amoxicillin-pot clavulanate], Codeine, Contrast media  [iodinated contrast media], Ibuprofen, Iodine, Levaquin [levofloxacin in d5w], and Morphine and related   Social History   Socioeconomic History   Marital status: Widowed    Spouse name: Not on file   Number of children: Not on file   Years of education: Not on file   Highest education level: Not on file  Occupational History   Not on file  Tobacco Use   Smoking status: Former    Packs/day: 0.50    Years: 50.00    Total pack years: 25.00    Types: Cigarettes    Quit date: 03/05/2014    Years since quitting: 7.7   Smokeless tobacco: Never  Vaping Use   Vaping Use: Never used  Substance and Sexual Activity   Alcohol use: Not Currently    Comment: occassionally. none last 47month   Drug use: No   Sexual activity: Not on file  Other Topics Concern   Not on file  Social History Narrative   Not on file   Social Determinants of Health   Financial Resource Strain: Not on file  Food Insecurity: Not on file  Transportation Needs: Not on file  Physical Activity: Not on file  Stress: Not on file  Social Connections: Not on file     Family History: The patient's family history includes Heart disease in her mother.  ROS:   Please see the history of present illness.    All other systems reviewed and are negative.  EKGs/Labs/Other Studies Reviewed:    The following studies were reviewed today:  November 16, 2021 in clinic device interrogation personally reviewed Battery status okay.  Lead parameters stable. Atrial pacing 33% Ventricular pacing 7.6% AF burden less than 1% Longest AHR episode 22 minutes   Recent Labs: No results found for requested labs within last 365 days.  Recent Lipid Panel No results found for: "CHOL", "TRIG", "HDL", "CHOLHDL", "VLDL", "LDLCALC", "LDLDIRECT"  Physical Exam:    VS:  BP (!) 142/78   Ht 5' 2"  (1.575 m)   Wt 128 lb (58.1 kg)   BMI 23.41 kg/m     Wt Readings from Last 3 Encounters:  11/16/21 128 lb (58.1 kg)  09/22/21 127 lb  12.8 oz (58 kg)  01/19/21 127 lb 12.8 oz (58 kg)     GEN:  Well nourished, well developed in no acute distress HEENT: Normal NECK: No JVD; No carotid bruits LYMPHATICS: No lymphadenopathy CARDIAC: RRR, no murmurs, rubs, gallops.  Pacemaker pocket well-healed. RESPIRATORY:  Clear to auscultation without rales, wheezing or rhonchi  ABDOMEN: Soft, non-tender, non-distended MUSCULOSKELETAL:  No edema; No deformity  SKIN: Warm and dry NEUROLOGIC:  Alert and oriented x 3 PSYCHIATRIC:  Normal affect        ASSESSMENT:    1. Complete heart block (HCC)   2. Cardiac pacemaker in situ   3. Essential hypertension    PLAN:    In order of problems listed above:     #Complete heart block #Pacemaker in  situ Device functioning appropriately.  Continue remote monitoring.  #Hypertension Slightly above goal today.  For now, recommend continuing to monitor this with blood pressure checks 1-2 times per week at home.  Please record these values and bring them to your primary care physician appointment.  Continue amlodipine 2.5 mg by mouth once daily.  Follow-up with [place] in 1 year or sooner as needed.     Medication Adjustments/Labs and Tests Ordered: Current medicines are reviewed at length with the patient today.  Concerns regarding medicines are outlined above.  No orders of the defined types were placed in this encounter.  No orders of the defined types were placed in this encounter.    Signed, Lars Mage, MD, [facility], [facility] 11/16/2021 12:57 PM    Electrophysiology [facility]

## 2021-11-16 ENCOUNTER — Encounter: Payer: Self-pay | Admitting: Cardiology

## 2021-11-16 ENCOUNTER — Ambulatory Visit (INDEPENDENT_AMBULATORY_CARE_PROVIDER_SITE_OTHER): Payer: Medicare Other | Admitting: Cardiology

## 2021-11-16 VITALS — BP 142/78 | Ht 62.0 in | Wt 128.0 lb

## 2021-11-16 DIAGNOSIS — I10 Essential (primary) hypertension: Secondary | ICD-10-CM | POA: Diagnosis not present

## 2021-11-16 DIAGNOSIS — I442 Atrioventricular block, complete: Secondary | ICD-10-CM | POA: Diagnosis not present

## 2021-11-16 DIAGNOSIS — Z950 Presence of cardiac pacemaker: Secondary | ICD-10-CM

## 2021-11-16 NOTE — Patient Instructions (Signed)
Medications: Your physician recommends that you continue on your current medications as directed. Please refer to the Current Medication list given to you today. *If you need a refill on your cardiac medications before your next appointment, please call your pharmacy*  Lab Work: None. If you have labs (blood work) drawn today and your tests are completely normal, you will receive your results only by: [place] (if you have MyChart) OR A paper copy in the mail If you have any lab test that is abnormal or we need to change your treatment, we will call you to review the results.  Testing/Procedures: None.  Follow-Up: At [facility], you and your health needs are our priority.  As part of our continuing mission to provide you with exceptional heart care, we have created designated Provider Care Teams.  These Care Teams include your primary Cardiologist (physician) and Advanced Practice Providers (APPs -  Physician Assistants and Nurse Practitioners) who all work together to provide you with the care you need, when you need it.  Your physician wants you to follow-up in: 12 months with Lars Mage, MD     You will receive a reminder letter in the mail two months in advance. If you don't receive a letter, please call our office to schedule the follow-up appointment.  We recommend signing up for the patient portal called "MyChart".  Sign up information is provided on this After Visit Summary.  MyChart is used to connect with patients for Virtual Visits (Telemedicine).  Patients are able to view lab/test results, encounter notes, upcoming appointments, etc.  Non-urgent messages can be sent to your provider as well.   To learn more about what you can do with MyChart, go to [email or web address].    Any Other Special Instructions Will Be Listed Below (If Applicable).

## 2022-01-03 ENCOUNTER — Ambulatory Visit (INDEPENDENT_AMBULATORY_CARE_PROVIDER_SITE_OTHER): Payer: Medicare Other

## 2022-01-03 DIAGNOSIS — I442 Atrioventricular block, complete: Secondary | ICD-10-CM

## 2022-01-06 LAB — CUP PACEART REMOTE DEVICE CHECK
Battery Remaining Longevity: 115 mo
Battery Remaining Percentage: 90 %
Battery Voltage: 3.01 V
Brady Statistic AP VP Percent: 1.9 %
Brady Statistic AP VS Percent: 32 %
Brady Statistic AS VP Percent: 6.5 %
Brady Statistic AS VS Percent: 59 %
Brady Statistic RA Percent Paced: 34 %
Brady Statistic RV Percent Paced: 8.5 %
Date Time Interrogation Session: 20230913020610
Implantable Lead Implant Date: 20220314
Implantable Lead Implant Date: 20220314
Implantable Lead Location: 753859
Implantable Lead Location: 753860
Implantable Pulse Generator Implant Date: 20220314
Lead Channel Impedance Value: 480 Ohm
Lead Channel Impedance Value: 550 Ohm
Lead Channel Pacing Threshold Amplitude: 1 V
Lead Channel Pacing Threshold Amplitude: 1 V
Lead Channel Pacing Threshold Pulse Width: 0.5 ms
Lead Channel Pacing Threshold Pulse Width: 0.5 ms
Lead Channel Sensing Intrinsic Amplitude: 0.8 mV
Lead Channel Sensing Intrinsic Amplitude: 11.4 mV
Lead Channel Setting Pacing Amplitude: 1.25 V
Lead Channel Setting Pacing Amplitude: 2 V
Lead Channel Setting Pacing Pulse Width: 0.5 ms
Lead Channel Setting Sensing Sensitivity: 2 mV
Pulse Gen Model: 2272
Pulse Gen Serial Number: 6395904

## 2022-01-19 NOTE — Progress Notes (Signed)
Remote pacemaker transmission.   

## 2022-03-24 DIAGNOSIS — Z7901 Long term (current) use of anticoagulants: Secondary | ICD-10-CM

## 2022-03-24 HISTORY — DX: Long term (current) use of anticoagulants: Z79.01

## 2022-04-04 ENCOUNTER — Ambulatory Visit (INDEPENDENT_AMBULATORY_CARE_PROVIDER_SITE_OTHER): Payer: Medicare Other

## 2022-04-04 DIAGNOSIS — I442 Atrioventricular block, complete: Secondary | ICD-10-CM | POA: Diagnosis not present

## 2022-04-04 LAB — CUP PACEART REMOTE DEVICE CHECK
Battery Remaining Longevity: 112 mo
Battery Remaining Percentage: 88 %
Battery Voltage: 3.01 V
Brady Statistic AP VP Percent: 1.7 %
Brady Statistic AP VS Percent: 33 %
Brady Statistic AS VP Percent: 5.4 %
Brady Statistic AS VS Percent: 60 %
Brady Statistic RA Percent Paced: 34 %
Brady Statistic RV Percent Paced: 7.1 %
Date Time Interrogation Session: 20231212034605
Implantable Lead Connection Status: 753985
Implantable Lead Connection Status: 753985
Implantable Lead Implant Date: 20220314
Implantable Lead Implant Date: 20220314
Implantable Lead Location: 753859
Implantable Lead Location: 753860
Implantable Pulse Generator Implant Date: 20220314
Lead Channel Impedance Value: 440 Ohm
Lead Channel Impedance Value: 540 Ohm
Lead Channel Pacing Threshold Amplitude: 1 V
Lead Channel Pacing Threshold Amplitude: 1 V
Lead Channel Pacing Threshold Pulse Width: 0.5 ms
Lead Channel Pacing Threshold Pulse Width: 0.5 ms
Lead Channel Sensing Intrinsic Amplitude: 12 mV
Lead Channel Sensing Intrinsic Amplitude: 4.3 mV
Lead Channel Setting Pacing Amplitude: 1.25 V
Lead Channel Setting Pacing Amplitude: 2 V
Lead Channel Setting Pacing Pulse Width: 0.5 ms
Lead Channel Setting Sensing Sensitivity: 2 mV
Pulse Gen Model: 2272
Pulse Gen Serial Number: 6395904

## 2022-04-05 ENCOUNTER — Telehealth: Payer: Self-pay

## 2022-04-05 NOTE — Telephone Encounter (Signed)
-----   Message from Vickie Epley, MD sent at 04/04/2022  9:05 PM EST ----- Remote interrogation reviewed. Lead parameters stable.  AF burden is increasing with 1 episode lasting 48 minutes.  Please schedule Ms Kadel with the atrial fibrillation clinic to discuss the pros/cons of anticoagulation.  [name], MD, [facility], [facility] Cardiac Electrophysiology

## 2022-04-05 NOTE — Telephone Encounter (Signed)
I will let Dr. Quentin Ore decide on that.

## 2022-04-05 NOTE — Telephone Encounter (Signed)
Patient called and advised AF noted on remote and Dr. Quentin Ore recommends AF clinic referral to discuss pro/cons of [place]. Patient states she doesn't drive/have transportation to [place] and would like to see Dr. Quentin Ore in the [place] office for this. Advised I will forward to Dr. Quentin Ore and scheduling for update. Advised someone will reach out with apt. Info. Patient voiced understanding and appreciative of call.

## 2022-04-18 NOTE — Progress Notes (Unsigned)
Electrophysiology Office Follow up Visit Note:    Date:  04/18/2022   ID:  Sheila, Peters 1940-12-16, MRN [id number]  PCP:  Tracie Harrier, MD  [facility] Cardiologist:  Kathlyn Sacramento, MD  [facility] Electrophysiologist:  Vickie Epley, MD    Interval History:    Sheila Peters is a 81 y.o. female who presents for a follow up visit. She has a hx of CHB s/P PPM. Recent device interrogations have shown an increase in AF burden. She presents to clinic to discuss stroke risk mitigation strategies.       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 complication ([place])     Past Surgical History:  Procedure Laterality Date   ABDOMINAL HYSTERECTOMY     ANKLE SURGERY     left fracture   BACK SURGERY     herniated disk   CATARACT EXTRACTION W/PHACO Right 10/23/2018   Procedure: CATARACT EXTRACTION PHACO AND INTRAOCULAR LENS PLACEMENT ([place]) RIGHT;  Surgeon: Leandrew Koyanagi, MD;  Location: [place];  Service: Ophthalmology;  Laterality: Right;  PT WANTS TO BE LAST CASE   COLONOSCOPY N/A 02/26/2017   Procedure: COLONOSCOPY;  Surgeon: Manya Silvas, MD;  Location: [facility] ENDOSCOPY;  Service: Endoscopy;  Laterality: N/A;   COLONOSCOPY WITH PROPOFOL N/A 11/02/2014   Procedure: COLONOSCOPY WITH PROPOFOL;  Surgeon: Josefine Class, MD;  Location: [facility] ENDOSCOPY;  Service: Endoscopy;  Laterality: N/A;   excision of thyroid nodule     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   Procedure: FECAL TRANSPLANT;  Surgeon:  Manya Silvas, MD;  Location: [facility] ENDOSCOPY;  Service: Endoscopy;  Laterality: N/A;   JOINT REPLACEMENT     2014 lth   LEFT HEART CATH AND CORONARY ANGIOGRAPHY N/A 07/04/2020   Procedure: LEFT HEART CATH AND CORONARY ANGIOGRAPHY possible percutaneous intervention, temporary transvenous temporary wire;  Surgeon: Nelva Bush, MD;  Location: [place] CV LAB;  Service: Cardiovascular;  Laterality: N/A;  left heart cath and placement of temporary transvenous pacing wire   PACEMAKER IMPLANT N/A 07/05/2020   Procedure: PACEMAKER IMPLANT;  Surgeon: Vickie Epley, MD;  Location: [place] CV LAB;  Service: Cardiovascular;  Laterality: N/A;   TEMPORARY PACEMAKER N/A 07/04/2020   Procedure: TEMPORARY PACEMAKER;  Surgeon: Nelva Bush, MD;  Location: [place] CV LAB;  Service: Cardiovascular;  Laterality: N/A;    Current Medications: No outpatient medications have been marked as taking for the 04/19/22 encounter (Office Visit) with Vickie Epley, MD.     Allergies:   Adhesive [tape], Augmentin [amoxicillin-pot clavulanate], Codeine, Contrast media [iodinated contrast media], Ibuprofen, Iodine, Levaquin [levofloxacin in d5w], and Morphine and related   Social History   Socioeconomic History   Marital status: Widowed    Spouse name: Not on file   Number of children: Not on file   Years of education: Not on file   Highest education level: Not on file  Occupational History   Not on file  Tobacco Use   Smoking status:  Former    Packs/day: 0.50    Years: 50.00    Total pack years: 25.00    Types: Cigarettes    Quit date: 03/05/2014    Years since quitting: 8.1   Smokeless tobacco: Never  Vaping Use   Vaping Use: Never used  Substance and Sexual Activity   Alcohol use: Not Currently    Comment: occassionally. none last 39month   Drug use: No   Sexual activity: Not on file  Other Topics Concern   Not on file  Social History Narrative   Not on file   Social  Determinants of Health   Financial Resource Strain: Not on file  Food Insecurity: Not on file  Transportation Needs: Not on file  Physical Activity: Not on file  Stress: Not on file  Social Connections: Not on file     Family History: The patient's family history includes Heart disease in her mother.  ROS:   Please see the history of present illness.    All other systems reviewed and are negative.  EKGs/Labs/Other Studies Reviewed:    The following studies were reviewed today:  04/19/2022 in clinic device interrogation personally reviewed ***    Recent Labs: No results found for requested labs within last 365 days.  Recent Lipid Panel No results found for: "CHOL", "TRIG", "HDL", "CHOLHDL", "VLDL", "LDLCALC", "LDLDIRECT"  Physical Exam:    VS:  There were no vitals taken for this visit.    Wt Readings from Last 3 Encounters:  11/16/21 128 lb (58.1 kg)  09/22/21 127 lb 12.8 oz (58 kg)  01/19/21 127 lb 12.8 oz (58 kg)     GEN: *** Well nourished, well developed in no acute distress HEENT: Normal NECK: No JVD; No carotid bruits LYMPHATICS: No lymphadenopathy CARDIAC: ***RRR, no murmurs, rubs, gallops. CIED pocket well healed. RESPIRATORY:  Clear to auscultation without rales, wheezing or rhonchi  ABDOMEN: Soft, non-tender, non-distended MUSCULOSKELETAL:  No edema; No deformity  SKIN: Warm and dry NEUROLOGIC:  Alert and oriented x 3 PSYCHIATRIC:  Normal affect        ASSESSMENT:    1. Complete heart block (HCC)   2. Cardiac pacemaker in situ   3. Paroxysmal atrial fibrillation (HCC)   4. Primary hypertension    PLAN:    In order of problems listed above:  #CHB s/p PPM Doing well after PPM implant. Device functioning appropriately, continue remote monitoring.  #pAF Low burden but increasing on remote interrogations.  CHA2DS2-VASc Score = 4  The patient's score is based upon: CHF History: 0 HTN History: 1 Diabetes History: 0 Stroke History:  0 Vascular Disease History: 0 Age Score: 2 Gender Score: 1  She will need repeat blood work today to confirm stable renal function and Hgb.  I have recommended Eliquis for stroke ppx given her elevated chadsvasc and increasing burden of device detected AF. I have discussed the risks and potential benefits of AC and she wishes to proceed with starting the medication.   Follow up 6 months with APP.    Total time spent with patient today *** minutes. This includes reviewing records, evaluating the patient and coordinating care.   Medication Adjustments/Labs and Tests Ordered: Current medicines are reviewed at length with the patient today.  Concerns regarding medicines are outlined above.  No orders of the defined types were placed in this encounter.  No orders of the defined types were placed in this encounter.    Signed, CLars Mage MD, [facility]12/26/2023 7:26 PM  Electrophysiology [facility]

## 2022-04-19 ENCOUNTER — Ambulatory Visit: Payer: Medicare Other | Attending: Cardiology | Admitting: Cardiology

## 2022-04-19 ENCOUNTER — Encounter: Payer: Self-pay | Admitting: Cardiology

## 2022-04-19 ENCOUNTER — Other Ambulatory Visit
Admission: RE | Admit: 2022-04-19 | Discharge: 2022-04-19 | Disposition: A | Discharge status: Home / Self Care | Payer: Medicare Other | Source: Ambulatory Visit | Attending: Cardiology | Admitting: Cardiology

## 2022-04-19 VITALS — BP 134/72 | HR 64 | Ht 62.0 in | Wt 125.4 lb

## 2022-04-19 DIAGNOSIS — I10 Essential (primary) hypertension: Secondary | ICD-10-CM | POA: Diagnosis present

## 2022-04-19 DIAGNOSIS — I480 Paroxysmal atrial fibrillation: Secondary | ICD-10-CM

## 2022-04-19 DIAGNOSIS — Z950 Presence of cardiac pacemaker: Secondary | ICD-10-CM

## 2022-04-19 DIAGNOSIS — I442 Atrioventricular block, complete: Secondary | ICD-10-CM

## 2022-04-19 HISTORY — DX: Paroxysmal atrial fibrillation: I48.0

## 2022-04-19 LAB — BASIC METABOLIC PANEL
Anion gap: 10 (ref 5–15)
BUN: 31 mg/dL — ABNORMAL HIGH (ref 8–23)
CO2: 23 mmol/L (ref 22–32)
Calcium: 9.4 mg/dL (ref 8.9–10.3)
Chloride: 105 mmol/L (ref 98–111)
Creatinine, Ser: 0.92 mg/dL (ref 0.44–1.00)
GFR, Estimated: >60 mL/min (ref >60)
Glucose, Bld: 98 mg/dL (ref 70–99)
Potassium: 4.3 mmol/L (ref 3.5–5.1)
Sodium: 138 mmol/L (ref 135–145)

## 2022-04-19 LAB — PACEMAKER DEVICE OBSERVATION

## 2022-04-19 LAB — CBC WITH DIFFERENTIAL/PLATELET
Abs Immature Granulocytes: 0.03 10*3/uL (ref 0.00–0.07)
Basophils Absolute: 0.1 10*3/uL (ref 0.0–0.1)
Basophils Relative: 1 %
Eosinophils Absolute: 0.1 10*3/uL (ref 0.0–0.5)
Eosinophils Relative: 1 %
HCT: 42.4 % (ref 36.0–46.0)
Hemoglobin: 13.6 g/dL (ref 12.0–15.0)
Immature Granulocytes: 0 %
Lymphocytes Relative: 16 %
Lymphs Abs: 1.5 10*3/uL (ref 0.7–4.0)
MCH: 28.9 pg (ref 26.0–34.0)
MCHC: 32.1 g/dL (ref 30.0–36.0)
MCV: 90 fL (ref 80.0–100.0)
Monocytes Absolute: 0.6 10*3/uL (ref 0.1–1.0)
Monocytes Relative: 7 %
Neutro Abs: 6.7 10*3/uL (ref 1.7–7.7)
Neutrophils Relative %: 75 %
Platelets: 210 10*3/uL (ref 150–400)
RBC: 4.71 MIL/uL (ref 3.87–5.11)
RDW: 14.5 % (ref 11.5–15.5)
WBC: 9 10*3/uL (ref 4.0–10.5)
nRBC: 0 % (ref 0.0–0.2)

## 2022-04-19 MED ORDER — APIXABAN 5 MG PO TABS: 5.0000 mg | ORAL_TABLET | Freq: Two times a day (BID) | ORAL | 3 refills | Status: DC

## 2022-04-19 NOTE — Patient Instructions (Signed)
Medication Instructions:  Your physician has recommended you make the following change in your medication:  1) START taking Eliquis 5 mg twice daily  *If you need a refill on your cardiac medications before your next appointment, please call your pharmacy*   Lab Work: TODAY: BMET and CBC If you have labs (blood work) drawn today and your tests are completely normal, you will receive your results only by: [facility] (if you have MyChart) OR A paper copy in the mail If you have any lab test that is abnormal or we need to change your treatment, we will call you to review the results.  Follow-Up: At [facility], you and your health needs are our priority.  As part of our continuing mission to provide you with exceptional heart care, we have created designated Provider Care Teams.  These Care Teams include your primary Cardiologist (physician) and Advanced Practice Providers (APPs -  Physician Assistants and Nurse Practitioners) who all work together to provide you with the care you need, when you need it.  Your next appointment:   3 month(s)  The format for your next appointment:   In Person  Provider:   You will see one of the following Advanced Practice Providers on your designated Care Team:   Murray Hodgkins, NP Christell Faith, PA-C Cadence Kathlen Mody, PA-C Gerrie Nordmann, NP  Important Information About Sugar

## 2022-05-02 ENCOUNTER — Ambulatory Visit
Admission: RE | Admit: 2022-05-02 | Discharge: 2022-05-02 | Disposition: A | Discharge status: Home / Self Care | Payer: Medicare Other | Source: Ambulatory Visit | Attending: Acute Care | Admitting: Acute Care

## 2022-05-02 DIAGNOSIS — R911 Solitary pulmonary nodule: Secondary | ICD-10-CM | POA: Diagnosis present

## 2022-05-02 DIAGNOSIS — I700 Atherosclerosis of aorta: Secondary | ICD-10-CM | POA: Diagnosis not present

## 2022-05-02 DIAGNOSIS — M25519 Pain in unspecified shoulder: Secondary | ICD-10-CM | POA: Insufficient documentation

## 2022-05-02 DIAGNOSIS — J439 Emphysema, unspecified: Secondary | ICD-10-CM | POA: Insufficient documentation

## 2022-05-03 NOTE — Progress Notes (Signed)
Remote pacemaker transmission.   

## 2022-05-16 ENCOUNTER — Encounter: Payer: Self-pay | Admitting: Pulmonary Disease

## 2022-05-16 ENCOUNTER — Ambulatory Visit (INDEPENDENT_AMBULATORY_CARE_PROVIDER_SITE_OTHER): Payer: Medicare Other | Admitting: Pulmonary Disease

## 2022-05-16 VITALS — BP 148/68 | HR 64 | Temp 97.8°F | Ht 62.0 in | Wt 124.8 lb

## 2022-05-16 DIAGNOSIS — R918 Other nonspecific abnormal finding of lung field: Secondary | ICD-10-CM | POA: Diagnosis not present

## 2022-05-16 DIAGNOSIS — R911 Solitary pulmonary nodule: Secondary | ICD-10-CM

## 2022-05-16 DIAGNOSIS — J449 Chronic obstructive pulmonary disease, unspecified: Secondary | ICD-10-CM | POA: Diagnosis not present

## 2022-05-16 DIAGNOSIS — Z87891 Personal history of nicotine dependence: Secondary | ICD-10-CM | POA: Diagnosis not present

## 2022-05-16 MED ORDER — TRELEGY ELLIPTA 200-62.5-25 MCG/ACT IN AEPB: 1.0000 | INHALATION_SPRAY | Freq: Every day | RESPIRATORY_TRACT | 0 refills | Status: AC

## 2022-05-16 NOTE — Patient Instructions (Signed)
Your lungs sounded really clear today.  We do not need to keep repeating chest CTs.  Continue your Trelegy, we provided you with some samples today.  See you in follow-up in a year's time call sooner should any new problems arise.

## 2022-05-16 NOTE — Progress Notes (Signed)
Subjective:    Patient ID: Sheila Peters, female    DOB: 1940-06-25, 82 y.o.   MRN: [id number] Patient Care Team: Tracie Harrier, MD as PCP - General (Internal Medicine) Wellington Hampshire, MD as PCP - Cardiology (Cardiology) Vickie Epley, MD as PCP - Electrophysiology (Cardiology) Tracie Harrier, MD (Internal Medicine)  Chief Complaint  Patient presents with   Follow-up    Breathing is overall doing well. No new co's today. She rarely uses her albuterol.     HPI Sheila Peters is a 82 year old former smoker (quit 2015) who previously followed here with Dr. Patricia Pesa.  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  This is a scheduled follow-up visit for abnormalities noted on a low-dose lung cancer screening CT performed previously.  Recall that the patient had had her first abnormal lung cancer screening on 13 October 2020.  At that time however the patient was symptomatic with a lower respiratory tract infection.  Subsequent CT chest imaging has been consistent with postinfectious/postinflammatory change.  She had a CT chest on 02 May 2022 that showed stable right apical scarring and small pulmonary nodules that are considered benign based on stability.  No new or enlarging pulmonary nodules identified.  At a prior visit also it was noted that she has significant gastroesophageal reflux, she has been on PPI and the symptoms have resolved.    The patient has stage III COPD and is on Trelegy.  He does not note issues with increasing dyspnea since she had her pacemaker for complete heart block in March 2022.  She notes that the pacemaker has been the biggest change in her wellbeing.  Today she does not endorse any symptomatology.  She is compliant with the Trelegy which also helps her with her breathing.  Overall, she feels well and looks well.   DATA 01/14/2016 PFTs: FEV1 0.97 L or 49% predicted, FVC 1.99 L or 75% predicted, FEV1 FVC 49% consistent with COPD stage II-III. 01/13/2021  LDCT:area of probable evolving post infectious or inflammatory scarring in the right upper lobe with adjacent pleural based nodule in the apex of the right upper lobe which is only slightly different than the prior study. This is favored to be benign, but categorized as Lung-RADS 4AS, suspicious. Follow up low-dose chest CT without contrast in 3 months  05/20/2021 LDCT:The nodular area of concern in the apex of the right hemithorax is similar to the prior study, compatible with a benign area of post infectious or inflammatory scarring. No new suspicious findings are noted on the examination. 05/02/2022 CT chest without contrast: Stable right apical scarring, small pulmonary nodules that are stable and considered benign given stability, no new nodules identified.  No acute chest findings.  Review of Systems A 10 point review of systems was performed and it is as noted above otherwise negative.  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 none last 42months   Allergies  Allergen Reactions   Adhesive [Tape]     Tears skin   Augmentin [Amoxicillin-Pot Clavulanate] Nausea Only    Dizziness   Codeine Nausea Only    "dizzy"   Contrast Media [Iodinated Contrast Media] Hives   Ibuprofen Hives and Swelling   Iodine Other (See Comments)   Levaquin [Levofloxacin In D5w] Nausea Only   Morphine And Related Nausea Only    dizziness   Current Meds  Medication Sig   acetaminophen (TYLENOL) 500 MG tablet Take 500 mg by mouth every 6 (six) hours as needed.   albuterol (VENTOLIN HFA) 108 (90 Base) MCG/ACT inhaler Inhale 2 puffs into the lungs every 6 (six) hours as needed for wheezing or shortness of breath.   amLODipine (NORVASC) 2.5 MG tablet Take 2.5 mg by mouth daily.   apixaban (ELIQUIS) 5 MG TABS tablet Take 1 tablet (5 mg total) by mouth 2 (two) times daily.   Calcium Citrate-Vitamin D (CITRACAL + D PO) Take by mouth. Taking 1 daily   Cholecalciferol (D3-1000 PO) Take by mouth. Taking 1 daily   Cyanocobalamin (VITAMIN B 12 PO) Take by mouth. Taking 1 daily   Fluticasone-Umeclidin-Vilant (TRELEGY ELLIPTA) 200-62.5-25 MCG/ACT AEPB Inhale 1 puff into the lungs daily.   latanoprost (XALATAN) 0.005 % ophthalmic solution Place 1 drop into both eyes at bedtime.    levothyroxine (SYNTHROID) 50 MCG tablet Take 50 mcg by mouth daily.   pantoprazole (PROTONIX) 40 MG tablet Take 1 tablet (40 mg total) by mouth daily.   psyllium (METAMUCIL) 58.6 % powder Take 1 packet by mouth 3 (three) times daily.   rosuvastatin (CRESTOR) 5 MG tablet Take 5 mg by mouth daily.   Immunization History  Administered Date(s) Administered   Influenza Inj Mdck Quad Pf 01/17/2018, 01/21/2019, 02/08/2021, 02/10/2022   Influenza,inj,Quad PF,6+ Mos 01/18/2016, 02/03/2020   Influenza-Unspecified 01/19/2012, 02/14/2013, 01/02/2014, 01/17/2016   Moderna Sars-Covid-2 Vaccination  06/11/2019, 07/09/2019, 03/15/2020   PPD Test 03/26/2017   Pneumococcal Conjugate-13 11/28/2016   Pneumococcal Polysaccharide-23 04/11/2011       Objective:   Physical Exam BP (!) 148/68 (BP Location: Left Arm, Cuff Size: Normal)   Pulse 64   Temp 97.8 F (36.6 C) (Oral)   Ht 5\' 2"  (1.575 m)   Wt 124 lb 12.8 oz (56.6 kg)   SpO2 95% Comment: on RA  BMI 22.83 kg/m   SpO2: 95 % (on RA) O2 Device: None (Room air)  GENERAL: Awake, alert, no acute distress.  Mild chronic use of accessories.  Very well-groomed.  Fully ambulatory. HEAD: Normocephalic, atraumatic.  EYES: Pupils equal, round, reactive to light.  No scleral icterus.  MOUTH: Masking requirements. NECK: Supple. No thyromegaly. Trachea midline. No JVD.  No adenopathy. PULMONARY: Increased AP diameter. Distant breath sounds bilaterally, no adventitious sounds. CARDIOVASCULAR: S1 and S2. Regular rate and rhythm.  GASTROINTESTINAL: No abdominal distention. MUSCULOSKELETAL: No joint deformity, no clubbing,  no edema.  NEUROLOGIC: No focal deficits noted, gait steady, speech fluent. SKIN: Intact,warm,dry.  Limited exam, no rashes.   PSYCH: Mood and behavior normal.  Reviewed CT chest from 02 May 2022, discussed with the patient and her daughter.     Assessment & Plan:     ICD-10-CM   1. Stage 3 severe COPD by GOLD classification ([place])  J44.9    Continue Trelegy 200, 1 inhaler daily Continue as needed albuterol Follow-up 1 year or as needed    2. Lung nodules  R91.8    Benign given stability No further imaging needed    3. Former smoker  Z87.891    No evidence of relapse     Meds ordered this encounter  Medications   Fluticasone-Umeclidin-Vilant (TRELEGY ELLIPTA) 200-62.5-25 MCG/ACT AEPB    Sig: Inhale 1 puff into the lungs daily.    Dispense:  14 each    Refill:  0    Order Specific Question:   Lot Number?    Answer:   [id number]    Order Specific Question:   Expiration Date?    Answer:   10/22/2023    Order  Specific Question:   Quantity    Answer:   2   Was initially set for follow-up in 6 months however she prefers years time due to "seeing too many doctors".  We will see her in a year's time, she does however know that if he develops new symptoms or issues she is always welcome to call earlier for follow-up.  Renold Don, MD Advanced Bronchoscopy PCCM [facility]    *This note was dictated using voice recognition software/Dragon.  Despite best efforts to proofread, errors can occur which can change the meaning. Any transcriptional errors that result from this process are unintentional and may not be fully corrected at the time of dictation.

## 2022-05-24 ENCOUNTER — Encounter: Payer: Self-pay | Admitting: Pulmonary Disease

## 2022-06-30 ENCOUNTER — Encounter: Payer: Self-pay | Admitting: Cardiology

## 2022-07-04 ENCOUNTER — Ambulatory Visit (INDEPENDENT_AMBULATORY_CARE_PROVIDER_SITE_OTHER): Payer: Medicare Other

## 2022-07-04 DIAGNOSIS — I442 Atrioventricular block, complete: Secondary | ICD-10-CM | POA: Diagnosis not present

## 2022-07-06 LAB — CUP PACEART REMOTE DEVICE CHECK
Battery Remaining Longevity: 107 mo
Battery Remaining Percentage: 86 %
Battery Voltage: 3.01 V
Brady Statistic AP VP Percent: 1 %
Brady Statistic AP VS Percent: 40 %
Brady Statistic AS VP Percent: 1 %
Brady Statistic AS VS Percent: 60 %
Brady Statistic RA Percent Paced: 39 %
Brady Statistic RV Percent Paced: 1 %
Date Time Interrogation Session: 20240312032625
Implantable Lead Connection Status: 753985
Implantable Lead Connection Status: 753985
Implantable Lead Implant Date: 20220314
Implantable Lead Implant Date: 20220314
Implantable Lead Location: 753859
Implantable Lead Location: 753860
Implantable Pulse Generator Implant Date: 20220314
Lead Channel Impedance Value: 410 Ohm
Lead Channel Impedance Value: 530 Ohm
Lead Channel Pacing Threshold Amplitude: 0.875 V
Lead Channel Pacing Threshold Amplitude: 1 V
Lead Channel Pacing Threshold Pulse Width: 0.5 ms
Lead Channel Pacing Threshold Pulse Width: 0.5 ms
Lead Channel Sensing Intrinsic Amplitude: 3.1 mV
Lead Channel Sensing Intrinsic Amplitude: 9.7 mV
Lead Channel Setting Pacing Amplitude: 1.125
Lead Channel Setting Pacing Amplitude: 2 V
Lead Channel Setting Pacing Pulse Width: 0.5 ms
Lead Channel Setting Sensing Sensitivity: 2 mV
Pulse Gen Model: 2272
Pulse Gen Serial Number: 6395904

## 2022-07-19 ENCOUNTER — Encounter: Payer: Self-pay | Admitting: Nurse Practitioner

## 2022-07-19 NOTE — Progress Notes (Signed)
Cardiology Office Note:    Date:  07/20/2022   ID:  Sheila Peters, DOB [birth year], MRN [id number]  PCP:  Tracie Harrier, MD   [place] [facility] Cardiologist:  Kathlyn Sacramento, MD Electrophysiologist:  Vickie Epley, MD     Referring MD: Tracie Harrier, MD   CC: follow up for HTN, AF   History of Present Illness:    Sheila Peters is a 82 y.o. female with a hx of hypertension, complete heart block s/p PPM, CVA, PAF (noted on device interrogation, Eliquis), aortic atherosclerosis (noted on CT imaging), PVCs, COPD (O2 dependent at home), former smoker (cessation in 2015), GERD, ulcerative colitis, hypothyroidism.  She initially establish care with Dr. Fletcher Anon in 2013 for exertional dyspnea, PVCs, and atypical chest pain.  She had a Lexiscan Myoview at that time that showed no evidence of ischemia, echocardiogram showed normal LV systolic function with no significant valvular abnormalities.   On 09/18/2019 she reestablished care with Dr. Fletcher Anon at the behest of her PCP for evaluation for exertional dyspnea.  She had a CT in 2020 which did show evidence of aortic and coronary atherosclerosis.  Repeat Lexiscan Myoview on 10/02/2019 was normal.  Echocardiogram on 10/24/2019 showed an EF of 60 to 65%, grade 1 DD, mildly elevated PA systolic pressure.  On 07/04/2020 she was admitted with syncope and a high-grade heart block.  She underwent a left heart cath which showed nonobstructive coronary disease.  Echocardiogram showed EF 55 to 60%, grade 1 DD.  Temporary pacing wires were placed and she was transferred to [facility] for further evaluation by EP and placement of PPM.  On 07/05/2020 she underwent dual-chamber PPM.  In September 2022 she presented to neurology office with stroke-like symptoms, her head CT was negative for any acute intracranial processes.  An MRI was ordered and initially she agreed to proceed however she eventually decided she did not want to proceed with MRI  imaging.  On 04/19/2022 she was evaluated by Dr. Quentin Ore, recent interrogations of her PPM showed a low burden of atrial fibrillation however frequency was increasing.  Her CHA2DS2-VASc score at this time was 6 she was started on Eliquis.  She presents today accompanied by her granddaughter for follow-up of her hypertension, CHB.  She has been doing well from a cardiac perspective without any real complaints.  She does have some shortness of breath however this is baseline for her and related to her COPD. She had some concerns with taking her Eliquis twice daily due to potential side effects, and had been taken it once/day. She denies chest pain, palpitations, dyspnea, pnd, orthopnea, n, v, dizziness, syncope, edema, weight gain, or early satiety. She denies hematochezia, hematuria or hemoptysis.   Past Medical History:  Diagnosis Date   Anticoagulated 03/2022   Eliquis   Aortic atherosclerosis ([place]) 2020   noted on CT imaging   Arthritis    Asthma    Clostridium difficile diarrhea    Colitis, nonspecific    Complete heart block ([place]) 07/22/2020   a. 06/2020 s/p Abbott 2272 Assurity MRI DC PPM (ser # [id number]).   COPD (chronic obstructive pulmonary disease) (HCC)    GERD (gastroesophageal reflux disease)    Hypertension    Hypothyroidism    post-surgical   Multiple thyroid nodules    post excision   Nephrolithiasis    spontaneously passed   Nephrolithiasis    Non-toxic uninodular goiter    Osteoporosis, postmenopausal    PAF (paroxysmal atrial fibrillation) ([place]) 04/19/2022  noted on device interrogation. CHA2DS2-VASC 6   Presence of permanent cardiac pacemaker 07/05/2020   Stage 3 severe COPD by GOLD classification ([place])    O2 use at home   Stroke [facility]) 12/2020   evaluated by neuro, felt it could be sub-thalmic stroke, she declined MRI   Syncope and collapse 07/04/2020   CHB   Ulcerative colitis, chronic (HCC)    Ulcerative pancolitis without complication [facility])     Past  Surgical History:  Procedure Laterality Date   ABDOMINAL HYSTERECTOMY     ANKLE SURGERY     left fracture   BACK SURGERY     herniated disk   CATARACT EXTRACTION W/PHACO Right 10/23/2018   Procedure: CATARACT EXTRACTION PHACO AND INTRAOCULAR LENS PLACEMENT ([place]) RIGHT;  Surgeon: Leandrew Koyanagi, MD;  Location: [place];  Service: Ophthalmology;  Laterality: Right;  PT WANTS TO BE LAST CASE   COLONOSCOPY N/A 02/26/2017   Procedure: COLONOSCOPY;  Surgeon: Manya Silvas, MD;  Location: [facility] ENDOSCOPY;  Service: Endoscopy;  Laterality: N/A;   COLONOSCOPY WITH PROPOFOL N/A 11/02/2014   Procedure: COLONOSCOPY WITH PROPOFOL;  Surgeon: Josefine Class, MD;  Location: [facility] ENDOSCOPY;  Service: Endoscopy;  Laterality: N/A;   excision of thyroid nodule     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   Procedure: FECAL TRANSPLANT;  Surgeon: Manya Silvas, MD;  Location: [facility] ENDOSCOPY;  Service: Endoscopy;  Laterality: N/A;   JOINT REPLACEMENT     2014 lth   LEFT HEART CATH AND CORONARY ANGIOGRAPHY N/A 07/04/2020   Procedure: LEFT HEART CATH AND CORONARY ANGIOGRAPHY possible percutaneous intervention, temporary transvenous temporary wire;  Surgeon: Nelva Bush, MD;  Location: [place] CV LAB;  Service: Cardiovascular;  Laterality: N/A;  left heart cath and placement of temporary transvenous pacing wire   PACEMAKER IMPLANT N/A 07/05/2020   Procedure: PACEMAKER IMPLANT;  Surgeon: Vickie Epley, MD;  Location: [place] CV LAB;  Service: Cardiovascular;  Laterality: N/A;   TEMPORARY PACEMAKER N/A 07/04/2020   Procedure: TEMPORARY PACEMAKER;  Surgeon: Nelva Bush, MD;  Location: [place] CV LAB;  Service: Cardiovascular;  Laterality: N/A;    Current Medications: Current Meds  Medication Sig   acetaminophen (TYLENOL) 500 MG tablet Take 500 mg by mouth every 6 (six) hours as needed.   albuterol (VENTOLIN HFA) 108 (90 Base) MCG/ACT inhaler Inhale 2 puffs into the lungs  every 6 (six) hours as needed for wheezing or shortness of breath.   amLODipine (NORVASC) 2.5 MG tablet Take 2.5 mg by mouth daily.   Calcium Citrate-Vitamin D (CITRACAL + D PO) Take by mouth. Taking 1 daily   Cholecalciferol (D3-1000 PO) Take by mouth. Taking 1 daily   Cyanocobalamin (VITAMIN B 12 PO) Take by mouth. Taking 1 daily   Fluticasone-Umeclidin-Vilant (TRELEGY ELLIPTA) 200-62.5-25 MCG/ACT AEPB Inhale 1 puff into the lungs daily.   latanoprost (XALATAN) 0.005 % ophthalmic solution Place 1 drop into both eyes at bedtime.    levothyroxine (SYNTHROID) 50 MCG tablet Take 50 mcg by mouth daily.   pantoprazole (PROTONIX) 40 MG tablet Take 1 tablet (40 mg total) by mouth daily.   psyllium (METAMUCIL) 58.6 % powder Take 1 packet by mouth 3 (three) times daily.   rosuvastatin (CRESTOR) 5 MG tablet Take 5 mg by mouth daily.   [DISCONTINUED] apixaban (ELIQUIS) 5 MG TABS tablet Take 1 tablet (5 mg total) by mouth 2 (two) times daily.     Allergies:   Adhesive [tape], Augmentin [amoxicillin-pot clavulanate], Codeine, Contrast media [iodinated contrast  media], Ibuprofen, Iodine, Levaquin [levofloxacin in d5w], and Morphine and related   Social History   Socioeconomic History   Marital status: Widowed    Spouse name: Not on file   Number of children: Not on file   Years of education: Not on file   Highest education level: Not on file  Occupational History   Not on file  Tobacco Use   Smoking status: Former    Packs/day: 0.50    Years: 50.00    Additional pack years: 0.00    Total pack years: 25.00    Types: Cigarettes    Quit date: 03/05/2014    Years since quitting: 8.3   Smokeless tobacco: Never  Vaping Use   Vaping Use: Never used  Substance and Sexual Activity   Alcohol use: Not Currently    Comment: occassionally. none last 70months   Drug use: No   Sexual activity: Not on file  Other Topics Concern   Not on file  Social History Narrative   Not on file   Social  Determinants of Health   Financial Resource Strain: Not on file  Food Insecurity: Not on file  Transportation Needs: Not on file  Physical Activity: Not on file  Stress: Not on file  Social Connections: Not on file     Family History: The patient's family history includes Heart disease in her mother.  ROS:   Please see the history of present illness.    All other systems reviewed and are negative.  EKGs/Labs/Other Studies Reviewed:    The following studies were reviewed today:  07/05/20 echo complete - EF 55-60%, no RWMA, grade I DD.  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  Otherwise, no significant coronary artery disease. Normal left ventricular contraction and filling pressure. Successful placement of 77F temporary transvenous pacing wire via the right internal jugular vein.   Recommendations: STAT PCXR when patient arrives in ICU. Maintain temporary pacemaker (rate 40 bpm, output 3 mA). Likely transfer to [facility] tomorrow for EP evaluation and possible permanent pacemaker placement. Medical therapy and risk factor modification to prevent progression of mild CAD.  10/03/2019 Myocardial perfusion -  Pharmacological myocardial perfusion imaging study with no significant  ischemia Normal wall motion, EF estimated at 82% No EKG changes concerning for ischemia at peak stress or in recovery. CT attenuation correction images with mild aortic atherosclerosis, mild coronary calcification Low risk scan  EKG:  EKG is ordered today.  The ekg ordered today demonstrates SR, HR 62 bpm, consistent with prior EKG tracings.   Recent Labs: 04/19/2022: BUN 31; Creatinine, Ser 0.92; Hemoglobin 13.6; Platelets 210; Potassium 4.3; Sodium 138  Recent Lipid Panel No results found for: "CHOL", "TRIG", "HDL", "CHOLHDL", "VLDL", "LDLCALC", "LDLDIRECT"   Risk Assessment/Calculations:    CHA2DS2-VASc Score = 6   This indicates a  9.7% annual risk of stroke. The patient's score is based upon: CHF History: 0 HTN History: 1 Diabetes History: 0 Stroke History: 2 Vascular Disease History: 0 Age Score: 2 Gender Score: 1               Physical Exam:    VS:  BP 130/78 (BP Location: Left Arm, Patient Position: Sitting, Cuff Size: Normal)   Pulse 62   Ht 5\' 2"  (1.575 m)   Wt 125 lb (56.7 kg)   SpO2 93%   BMI 22.86 kg/m     Wt Readings from Last 3 Encounters:  07/20/22 125 lb (56.7  kg)  05/16/22 124 lb 12.8 oz (56.6 kg)  04/19/22 125 lb 6.4 oz (56.9 kg)     GEN:  Well nourished, well developed in no acute distress HEENT: Normal NECK: No JVD; No carotid bruits LYMPHATICS: No lymphadenopathy CARDIAC: RRR, no murmurs, rubs, gallops RESPIRATORY:  Clear to auscultation without rales, wheezing or rhonchi  ABDOMEN: Soft, non-tender, non-distended MUSCULOSKELETAL:  No edema; No deformity  SKIN: Warm and dry NEUROLOGIC:  Alert and oriented x 3 PSYCHIATRIC:  Normal affect   ASSESSMENT:    1. Complete heart block (HCC)   2. Cardiac pacemaker in situ   3. Paroxysmal atrial fibrillation (HCC)   4. Primary hypertension   5. Mixed hyperlipidemia   6. Chronic obstructive pulmonary disease, unspecified COPD type ([place])   7. Aortic atherosclerosis (HCC)    PLAN:    In order of problems listed above:  CHB s/p PPM - Most recent device interrogation on 07/06/22 showed stable lead parameters, no AF burden. No further syncope or presyncope noted.   PAF - Noted on device interrogation in December 2023 less than 1% burden, CHA2DS2-VASc score is 6, she just turned 82 this year and with her weight @ 124 lbs, will decrease her Eliquis dose to 2.5 mg twice daily. Will see her PCP in a few weeks and plans to have labs with him, CBC and BMET 03/2022 were WNL.   Hypertension - BP today is 130/78, current controlled on current antihypertensive medication regimen. Continue Norvasc.   Aortic atherosclerosis with HLD - MPI in  2021 was low risk without evidence of ischemia.  Stable with no anginal symptoms. No indication for ischemic evaluation.  LDL 80 on 02/02/22, managed by her PCP with plans for repeat FLP in a few weeks, declined to increase her Statin at this time.  COPD - at her baseline, followed by Dr. Patsey Berthold.    Disposition - decrease Eliquis to 2.5 mg twice/day. Follow up in 6 months.           Medication Adjustments/Labs and Tests Ordered: Current medicines are reviewed at length with the patient today.  Concerns regarding medicines are outlined above.  Orders Placed This Encounter  Procedures   EKG 12-Lead   Meds ordered this encounter  Medications   apixaban (ELIQUIS) 2.5 MG TABS tablet    Sig: Take 1 tablet (2.5 mg total) by mouth 2 (two) times daily.    Dispense:  180 tablet    Refill:  3    Patient Instructions  Medication Instructions:  Your physician has recommended you make the following change in your medication:   DECREASE TO  - apixaban (ELIQUIS) 2.5 MG TABS tablet 2 times daily  *If you need a refill on your cardiac medications before your next appointment, please call your pharmacy*  Lab Work: -None ordered If you have labs (blood work) drawn today and your tests are completely normal, you will receive your results only by: [place] (if you have MyChart) OR A paper copy in the mail If you have any lab test that is abnormal or we need to change your treatment, we will call you to review the results.  Testing/Procedures: -None ordered  Follow-Up: At [facility], you and your health needs are our priority.  As part of our continuing mission to provide you with exceptional heart care, we have created designated Provider Care Teams.  These Care Teams include your primary Cardiologist (physician) and Advanced Practice Providers (APPs -  Physician Assistants and Nurse Practitioners)  who all work together to provide you with the care you need, when you need  it.  We recommend signing up for the patient portal called "MyChart".  Sign up information is provided on this After Visit Summary.  MyChart is used to connect with patients for Virtual Visits (Telemedicine).  Patients are able to view lab/test results, encounter notes, upcoming appointments, etc.  Non-urgent messages can be sent to your provider as well.   To learn more about what you can do with MyChart, go to [email or web address].    Your next appointment:   6 month(s)  Provider:   You may see Kathlyn Sacramento, MD or one of the following Advanced Practice Providers on your designated Care Team:   Murray Hodgkins, NP Christell Faith, PA-C Cadence Kathlen Mody, PA-C Gerrie Nordmann, NP   Other Instructions None    Signed, Sheila Ida, NP  07/20/2022 12:06 PM    [place]

## 2022-07-20 ENCOUNTER — Encounter: Payer: Self-pay | Admitting: Nurse Practitioner

## 2022-07-20 ENCOUNTER — Ambulatory Visit: Payer: Medicare Other | Attending: Nurse Practitioner | Admitting: Cardiology

## 2022-07-20 VITALS — BP 130/78 | HR 62 | Ht 62.0 in | Wt 125.0 lb

## 2022-07-20 DIAGNOSIS — I442 Atrioventricular block, complete: Secondary | ICD-10-CM | POA: Diagnosis not present

## 2022-07-20 DIAGNOSIS — E782 Mixed hyperlipidemia: Secondary | ICD-10-CM | POA: Diagnosis present

## 2022-07-20 DIAGNOSIS — I480 Paroxysmal atrial fibrillation: Secondary | ICD-10-CM

## 2022-07-20 DIAGNOSIS — I10 Essential (primary) hypertension: Secondary | ICD-10-CM

## 2022-07-20 DIAGNOSIS — I700 Atherosclerosis of aorta: Secondary | ICD-10-CM

## 2022-07-20 DIAGNOSIS — J449 Chronic obstructive pulmonary disease, unspecified: Secondary | ICD-10-CM | POA: Diagnosis present

## 2022-07-20 DIAGNOSIS — Z950 Presence of cardiac pacemaker: Secondary | ICD-10-CM | POA: Diagnosis not present

## 2022-07-20 MED ORDER — APIXABAN 2.5 MG PO TABS: 2.5000 mg | ORAL_TABLET | Freq: Two times a day (BID) | ORAL | 3 refills | Status: DC

## 2022-07-20 NOTE — Patient Instructions (Signed)
Medication Instructions:  Your physician has recommended you make the following change in your medication:   DECREASE TO  - apixaban (ELIQUIS) 2.5 MG TABS tablet 2 times daily  *If you need a refill on your cardiac medications before your next appointment, please call your pharmacy*  Lab Work: -None ordered If you have labs (blood work) drawn today and your tests are completely normal, you will receive your results only by: [place] (if you have MyChart) OR A paper copy in the mail If you have any lab test that is abnormal or we need to change your treatment, we will call you to review the results.  Testing/Procedures: -None ordered  Follow-Up: At [facility], you and your health needs are our priority.  As part of our continuing mission to provide you with exceptional heart care, we have created designated Provider Care Teams.  These Care Teams include your primary Cardiologist (physician) and Advanced Practice Providers (APPs -  Physician Assistants and Nurse Practitioners) who all work together to provide you with the care you need, when you need it.  We recommend signing up for the patient portal called "MyChart".  Sign up information is provided on this After Visit Summary.  MyChart is used to connect with patients for Virtual Visits (Telemedicine).  Patients are able to view lab/test results, encounter notes, upcoming appointments, etc.  Non-urgent messages can be sent to your provider as well.   To learn more about what you can do with MyChart, go to [email or web address].    Your next appointment:   6 month(s)  Provider:   You may see Kathlyn [place], MD or one of the following Advanced Practice Providers on your designated Care Team:   Murray Hodgkins, NP Christell Faith, PA-C Cadence Kathlen Mody, PA-C Gerrie Nordmann, NP   Other Instructions None

## 2022-08-11 NOTE — Progress Notes (Signed)
Remote pacemaker transmission.   

## 2022-10-03 ENCOUNTER — Ambulatory Visit: Payer: Medicare Other

## 2022-10-03 DIAGNOSIS — I442 Atrioventricular block, complete: Secondary | ICD-10-CM

## 2022-10-03 LAB — CUP PACEART REMOTE DEVICE CHECK
Battery Remaining Longevity: 104 mo
Battery Remaining Percentage: 84 %
Battery Voltage: 3.01 V
Brady Statistic AP VP Percent: 5.3 %
Brady Statistic AP VS Percent: 36 %
Brady Statistic AS VP Percent: 6.1 %
Brady Statistic AS VS Percent: 53 %
Brady Statistic RA Percent Paced: 40 %
Brady Statistic RV Percent Paced: 11 %
Date Time Interrogation Session: 20240611020551
Implantable Lead Connection Status: 753985
Implantable Lead Connection Status: 753985
Implantable Lead Implant Date: 20220314
Implantable Lead Implant Date: 20220314
Implantable Lead Location: 753859
Implantable Lead Location: 753860
Implantable Pulse Generator Implant Date: 20220314
Lead Channel Impedance Value: 450 Ohm
Lead Channel Impedance Value: 530 Ohm
Lead Channel Pacing Threshold Amplitude: 0.875 V
Lead Channel Pacing Threshold Amplitude: 1 V
Lead Channel Pacing Threshold Pulse Width: 0.5 ms
Lead Channel Pacing Threshold Pulse Width: 0.5 ms
Lead Channel Sensing Intrinsic Amplitude: 1.7 mV
Lead Channel Sensing Intrinsic Amplitude: 8.7 mV
Lead Channel Setting Pacing Amplitude: 1.125
Lead Channel Setting Pacing Amplitude: 2 V
Lead Channel Setting Pacing Pulse Width: 0.5 ms
Lead Channel Setting Sensing Sensitivity: 2 mV
Pulse Gen Model: 2272
Pulse Gen Serial Number: 6395904

## 2022-10-31 NOTE — Progress Notes (Signed)
Remote pacemaker transmission.   

## 2023-01-02 ENCOUNTER — Ambulatory Visit: Payer: Medicare Other

## 2023-01-02 DIAGNOSIS — I442 Atrioventricular block, complete: Secondary | ICD-10-CM | POA: Diagnosis not present

## 2023-01-02 LAB — CUP PACEART REMOTE DEVICE CHECK
Battery Remaining Longevity: 101 mo
Battery Remaining Percentage: 82 %
Battery Voltage: 3.01 V
Brady Statistic AP VP Percent: 3.8 %
Brady Statistic AP VS Percent: 39 %
Brady Statistic AS VP Percent: 4.5 %
Brady Statistic AS VS Percent: 53 %
Brady Statistic RA Percent Paced: 42 %
Brady Statistic RV Percent Paced: 8.2 %
Date Time Interrogation Session: 20240910024217
Implantable Lead Connection Status: 753985
Implantable Lead Connection Status: 753985
Implantable Lead Implant Date: 20220314
Implantable Lead Implant Date: 20220314
Implantable Lead Location: 753859
Implantable Lead Location: 753860
Implantable Pulse Generator Implant Date: 20220314
Lead Channel Impedance Value: 450 Ohm
Lead Channel Impedance Value: 510 Ohm
Lead Channel Pacing Threshold Amplitude: 1 V
Lead Channel Pacing Threshold Amplitude: 1.125 V
Lead Channel Pacing Threshold Pulse Width: 0.5 ms
Lead Channel Pacing Threshold Pulse Width: 0.5 ms
Lead Channel Sensing Intrinsic Amplitude: 2 mV
Lead Channel Sensing Intrinsic Amplitude: 9.7 mV
Lead Channel Setting Pacing Amplitude: 1.375
Lead Channel Setting Pacing Amplitude: 2 V
Lead Channel Setting Pacing Pulse Width: 0.5 ms
Lead Channel Setting Sensing Sensitivity: 2 mV
Pulse Gen Model: 2272
Pulse Gen Serial Number: 6395904

## 2023-01-18 NOTE — Progress Notes (Signed)
Remote pacemaker transmission.   

## 2023-01-19 ENCOUNTER — Ambulatory Visit: Payer: Medicare Other | Admitting: Physician Assistant

## 2023-01-26 ENCOUNTER — Ambulatory Visit: Payer: Medicare Other | Attending: Physician Assistant | Admitting: Medical

## 2023-01-26 ENCOUNTER — Encounter: Payer: Self-pay | Admitting: Medical

## 2023-01-26 VITALS — BP 138/70 | HR 70 | Ht 62.0 in | Wt 124.8 lb

## 2023-01-26 DIAGNOSIS — I480 Paroxysmal atrial fibrillation: Secondary | ICD-10-CM

## 2023-01-26 DIAGNOSIS — R600 Localized edema: Secondary | ICD-10-CM

## 2023-01-26 DIAGNOSIS — I442 Atrioventricular block, complete: Secondary | ICD-10-CM

## 2023-01-26 DIAGNOSIS — Z950 Presence of cardiac pacemaker: Secondary | ICD-10-CM | POA: Diagnosis present

## 2023-01-26 DIAGNOSIS — I10 Essential (primary) hypertension: Secondary | ICD-10-CM | POA: Diagnosis present

## 2023-01-26 DIAGNOSIS — E782 Mixed hyperlipidemia: Secondary | ICD-10-CM

## 2023-01-26 NOTE — Progress Notes (Signed)
Cardiology Office Note:    Date:  01/26/2023   ID:  PALMER SHOREY, DOB February 02, 1941, MRN [id number]  PCP:  Barbette Reichmann, MD  [facility] Cardiologist:  Lorine Bears, MD  [facility] Electrophysiologist:  Lanier Prude, MD   Referring MD: Barbette Reichmann, MD   Chief Complaint: 6 month follow-up  History of Present Illness:    JAZSMIN COUSE is a 82 y.o. female with a hx of HTN, CHB s/p PPM, CVA, PAF (noted on device on interrogation, Eliquis), aortic atherosclerosis on CT, PVCs, COPD on home O2, former smoker (Cessation in 2015), GERD, ulcerative colitis, hypothyroidism who presents for follow-up.  Patient initially establish care with Dr. Kirke Corin for exertional dyspnea, PVCs, and atypical chest pain.  Lexiscan Myoview that showed no evidence of ischemia.  Echo showed normal LV function with no significant valvular abnormalities.  She was lost to follow-up but came back in 2021 to reestablish care for exertional dyspnea.  CT in 2020 showed evidence of aortic and coronary atherosclerosis.  Repeat Lexiscan Myoview was normal.  Echo showed EF of 60 to 65%, grade 1 diastolic dysfunction, mildly elevated PA systolic pressure.  On 06/2020 she was admitted with syncope and a high-grade heart block. She underwent a left and right heart cath which showed nonobstructive coronary disease. Echo showed LVEF 55-60%, G1DD.  Temporary pacing wires were placed and she was transferred to [facility] for further evaluation by EP and placement of PPM. On 07/05/20 she underwent daul-chamber PPM.   In September 2022 she presented to neurology office with strokelike symptoms.  Her head CT was negative for acute intracranial process.  An MRI was ordered and initially she agreed to proceed however she eventually declined.  On December 2023 she was evaluated by Dr. Lalla Brothers.  Recent interrogations of her pacemaker showed a low burden of A-fib however frequency was increasing.  Due to CHA2DS2-VASc of 6 she  was started on Eliquis.  Patient was last seen March 2024 was overall doing well from a cardiac perspective.  Eliquis was decreased to 2.5 mg twice daily due to weight and age.  Today, the patient is overall doing OK. She denies chest pain. She has chronic SOB from COPD.  No palpitations reported. She has some dependent lower leg edema on the left side. She eats low salt diet. She elevates legs and swelling improves. Patient is unable to walk like she wants to due to SOB. She decided she didn't want to take it. She talkes to several people that are on it and is against taking it.   Past Medical History:  Diagnosis Date   Anticoagulated 03/2022   Eliquis   Aortic atherosclerosis (HCC) 2020   noted on CT imaging   Arthritis    Asthma    Clostridium difficile diarrhea    Colitis, nonspecific    Complete heart block (HCC) 07/22/2020   a. 06/2020 s/p Abbott 2272 Assurity MRI DC PPM (ser # [id number]).   COPD (chronic obstructive pulmonary disease) (HCC)    GERD (gastroesophageal reflux disease)    Hypertension    Hypothyroidism    post-surgical   Multiple thyroid nodules    post excision   Nephrolithiasis    spontaneously passed   Nephrolithiasis    Non-toxic uninodular goiter    Osteoporosis, postmenopausal    PAF (paroxysmal atrial fibrillation) (HCC) 04/19/2022   noted on device interrogation. CHA2DS2-VASC 6   Presence of permanent cardiac pacemaker 07/05/2020   Stage 3 severe COPD by GOLD classification (HCC)  O2 use at home   Stroke [facility]) 12/2020   evaluated by neuro, felt it could be sub-thalmic stroke, she declined MRI   Syncope and collapse 07/04/2020   CHB   Ulcerative colitis, chronic (HCC)    Ulcerative pancolitis without complication [facility])     Past Surgical History:  Procedure Laterality Date   ABDOMINAL HYSTERECTOMY     ANKLE SURGERY     left fracture   BACK SURGERY     herniated disk   CATARACT EXTRACTION W/PHACO Right 10/23/2018   Procedure: CATARACT  EXTRACTION PHACO AND INTRAOCULAR LENS PLACEMENT (IOC) RIGHT;  Surgeon: Lockie Mola, MD;  Location: [facility];  Service: Ophthalmology;  Laterality: Right;  PT WANTS TO BE LAST CASE   COLONOSCOPY N/A 02/26/2017   Procedure: COLONOSCOPY;  Surgeon: Scot Jun, MD;  Location: [facility] ENDOSCOPY;  Service: Endoscopy;  Laterality: N/A;   COLONOSCOPY WITH PROPOFOL N/A 11/02/2014   Procedure: COLONOSCOPY WITH PROPOFOL;  Surgeon: Elnita Maxwell, MD;  Location: [facility] ENDOSCOPY;  Service: Endoscopy;  Laterality: N/A;   excision of thyroid nodule     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   Procedure: FECAL TRANSPLANT;  Surgeon: Scot Jun, MD;  Location: [facility] ENDOSCOPY;  Service: Endoscopy;  Laterality: N/A;   JOINT REPLACEMENT     2014 lth   LEFT HEART CATH AND CORONARY ANGIOGRAPHY N/A 07/04/2020   Procedure: LEFT HEART CATH AND CORONARY ANGIOGRAPHY possible percutaneous intervention, temporary transvenous temporary wire;  Surgeon: Yvonne Kendall, MD;  Location: [facility] INVASIVE CV LAB;  Service: Cardiovascular;  Laterality: N/A;  left heart cath and placement of temporary transvenous pacing wire   PACEMAKER IMPLANT N/A 07/05/2020   Procedure: PACEMAKER IMPLANT;  Surgeon: Lanier Prude, MD;  Location: [facility] INVASIVE CV LAB;  Service: Cardiovascular;  Laterality: N/A;   TEMPORARY PACEMAKER N/A 07/04/2020   Procedure: TEMPORARY PACEMAKER;  Surgeon: Yvonne Kendall, MD;  Location: [facility] INVASIVE CV LAB;  Service: Cardiovascular;  Laterality: N/A;    Current Medications: Current Meds  Medication Sig   acetaminophen (TYLENOL) 500 MG tablet Take 500 mg by mouth every 6 (six) hours as needed.   albuterol (VENTOLIN HFA) 108 (90 Base) MCG/ACT inhaler Inhale 2 puffs into the lungs every 6 (six) hours as needed for wheezing or shortness of breath.   amLODipine (NORVASC) 2.5 MG tablet Take 2.5 mg by mouth daily.   BREO ELLIPTA 200-25 MCG/ACT AEPB Inhale 1 puff into the lungs daily.   Calcium  Citrate-Vitamin D (CITRACAL + D PO) Take by mouth. Taking 1 daily   Cholecalciferol (D3-1000 PO) Take by mouth. Taking 1 daily   Cyanocobalamin (VITAMIN B 12 PO) Take by mouth. Taking 1 daily   latanoprost (XALATAN) 0.005 % ophthalmic solution Place 1 drop into both eyes at bedtime.    levothyroxine (SYNTHROID) 50 MCG tablet Take 50 mcg by mouth daily.   pantoprazole (PROTONIX) 40 MG tablet Take 1 tablet (40 mg total) by mouth daily.   psyllium (METAMUCIL) 58.6 % powder Take 1 packet by mouth 3 (three) times daily.   rosuvastatin (CRESTOR) 5 MG tablet Take 5 mg by mouth daily.     Allergies:   Adhesive [tape], Augmentin [amoxicillin-pot clavulanate], Codeine, Contrast media [iodinated contrast media], Ibuprofen, Iodine, Levaquin [levofloxacin in d5w], and Morphine and codeine   Social History   Socioeconomic History   Marital status: Widowed    Spouse name: Not on file   Number of children: Not on file   Years of education: Not on file  Highest education level: Not on file  Occupational History   Not on file  Tobacco Use   Smoking status: Former    Current packs/day: 0.00    Average packs/day: 0.5 packs/day for 50.0 years (25.0 ttl pk-yrs)    Types: Cigarettes    Start date: 03/05/1964    Quit date: 03/05/2014    Years since quitting: 8.9   Smokeless tobacco: Never  Vaping Use   Vaping status: Never Used  Substance and Sexual Activity   Alcohol use: Not Currently    Comment: occassionally. none last 6months   Drug use: No   Sexual activity: Not on file  Other Topics Concern   Not on file  Social History Narrative   Not on file   Social Determinants of Health   Financial Resource Strain: Low Risk  (08/11/2022)   Received from [facility], [facility]   Overall Financial Resource Strain (CARDIA)    Difficulty of Paying Living Expenses: Not hard at all  Food Insecurity: No Food Insecurity (08/11/2022)   Received from [facility], [facility]   Hunger Vital Sign    Worried About Running Out of Food in the Last Year: Never true    Ran Out of Food in the Last Year: Never true  Transportation Needs: No Transportation Needs (08/11/2022)   Received from [facility], [facility]   [facility] - Transportation    In the past 12 months, has lack of transportation kept you from medical appointments or from getting medications?: No    Lack of Transportation (Non-Medical): No  Physical Activity: Not on file  Stress: Not on file  Social Connections: Not on file     Family History: The patient's family history includes Heart disease in her mother.  ROS:   Please see the history of present illness.     All other systems reviewed and are negative.  EKGs/Labs/Other Studies Reviewed:    The following studies were reviewed today:  07/05/20 echo complete - EF 55-60%, no RWMA, grade I DD.   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  Otherwise, no significant coronary artery disease. Normal left ventricular contraction and filling pressure. Successful placement of 39F temporary transvenous pacing wire via the right internal jugular vein.   Recommendations: STAT PCXR when patient arrives in ICU. Maintain temporary pacemaker (rate 40 bpm, output 3 mA). Likely transfer to [facility] tomorrow for EP evaluation and possible permanent pacemaker placement. Medical therapy and risk factor modification to prevent progression of mild CAD.   10/03/2019 Myocardial perfusion -  Pharmacological myocardial perfusion imaging study with no significant  ischemia Normal wall motion, EF estimated at 82% No EKG changes concerning for ischemia at peak stress or in recovery. CT attenuation correction images with mild aortic atherosclerosis, mild coronary calcification Low risk scan  EKG:  EKG is ordered today.  The ekg  ordered today demonstrates NSR, 1st degree AV block, LVH, LBBB  Recent Labs: 04/19/2022: BUN 31; Creatinine, Ser 0.92; Hemoglobin 13.6; Platelets 210; Potassium 4.3; Sodium 138  Recent Lipid Panel No results found for: "CHOL", "TRIG", "HDL", "CHOLHDL", "VLDL", "LDLCALC", "LDLDIRECT"   Physical Exam:    VS:  BP 138/70 (BP Location: Left Arm, Patient Position: Sitting, Cuff Size: Normal)   Pulse 70   Ht 5\' 2"  (1.575 m)   Wt 124 lb 12.8 oz (56.6 kg)   SpO2 95%  BMI 22.83 kg/m     Wt Readings from Last 3 Encounters:  01/26/23 124 lb 12.8 oz (56.6 kg)  07/20/22 125 lb (56.7 kg)  05/16/22 124 lb 12.8 oz (56.6 kg)     GEN:  Well nourished, well developed in no acute distress HEENT: Normal NECK: No JVD; No carotid bruits LYMPHATICS: No lymphadenopathy CARDIAC: RRR, no murmurs, rubs, gallops RESPIRATORY:  Clear to auscultation without rales, wheezing or rhonchi  ABDOMEN: Soft, non-tender, non-distended MUSCULOSKELETAL:  No edema; No deformity  SKIN: Warm and dry NEUROLOGIC:  Alert and oriented x 3 PSYCHIATRIC:  Normal affect   ASSESSMENT:    1. Complete heart block (HCC)   2. Cardiac pacemaker in situ   3. Paroxysmal A-fib (HCC)   4. Essential hypertension   5. Hyperlipidemia, mixed   6. Lower leg edema    PLAN:    In order of problems listed above:  CHB s/p PPM Patient is followed by EP. Most recent device check showed a normally functioning device.   Pafib Patient self-discontinued Eliquis due to not wanting to take it. Most recent device checks have not shown afib. Reason for Eliquis was discussed, however her mind is made up and she is not wanting to restart it. I will defer further a/c discussion to EP. She was on low dose Eliquis 2.5mg  BID.   HTN BP is normal today, continue amlodipine 2.5mg  daily.   HLD LDL 69. Continue Crestor 5mg  daily.   LLE Patient reports dependent lower leg edema, L>R. Swelling improves with leg elevation. I recommended low salt diet  and compression socks. No indication for diuretic.   Disposition: Follow up in 1 year(s) with MD/APP    Signed, Sparrow Sanzo David Stall, PA-C  01/26/2023 12:09 PM    [facility]

## 2023-01-26 NOTE — Patient Instructions (Signed)
Medication Instructions:  Your Physician recommend you continue on your current medication as directed.    *If you need a refill on your cardiac medications before your next appointment, please call your pharmacy*   Lab Work: None ordered.  If you have labs (blood work) drawn today and your tests are completely normal, you will receive your results only by: MyChart Message (if you have MyChart) OR A paper copy in the mail If you have any lab test that is abnormal or we need to change your treatment, we will call you to review the results.   Testing/Procedures: None ordered.    Follow-Up: At [facility], you and your health needs are our priority.  As part of our continuing mission to provide you with exceptional heart care, we have created designated Provider Care Teams.  These Care Teams include your primary Cardiologist (physician) and Advanced Practice Providers (APPs -  Physician Assistants and Nurse Practitioners) who all work together to provide you with the care you need, when you need it.  We recommend signing up for the patient portal called "MyChart".  Sign up information is provided on this After Visit Summary.  MyChart is used to connect with patients for Virtual Visits (Telemedicine).  Patients are able to view lab/test results, encounter notes, upcoming appointments, etc.  Non-urgent messages can be sent to your provider as well.   To learn more about what you can do with MyChart, go to [email or web address].    Your next appointment:   1 year(s)  Provider:   You may see Sheila Bears, MD or one of the following Advanced Practice Providers on your designated Care Team:   Nicolasa Ducking, NP Eula Listen, PA-C Cadence Fransico Michael, [place] Charlsie Quest, NP    Other Instructions Follow up with Dr. Lalla Brothers in 6 month

## 2023-04-03 ENCOUNTER — Ambulatory Visit (INDEPENDENT_AMBULATORY_CARE_PROVIDER_SITE_OTHER): Payer: Medicare Other

## 2023-04-03 DIAGNOSIS — I442 Atrioventricular block, complete: Secondary | ICD-10-CM | POA: Diagnosis not present

## 2023-04-04 LAB — CUP PACEART REMOTE DEVICE CHECK
Battery Remaining Longevity: 96 mo
Battery Remaining Percentage: 80 %
Battery Voltage: 3.01 V
Brady Statistic AP VP Percent: 3.4 %
Brady Statistic AP VS Percent: 39 %
Brady Statistic AS VP Percent: 4.1 %
Brady Statistic AS VS Percent: 53 %
Brady Statistic RA Percent Paced: 42 %
Brady Statistic RV Percent Paced: 7.5 %
Date Time Interrogation Session: 20241210042237
Implantable Lead Connection Status: 753985
Implantable Lead Connection Status: 753985
Implantable Lead Implant Date: 20220314
Implantable Lead Implant Date: 20220314
Implantable Lead Location: 753859
Implantable Lead Location: 753860
Implantable Pulse Generator Implant Date: 20220314
Lead Channel Impedance Value: 450 Ohm
Lead Channel Impedance Value: 490 Ohm
Lead Channel Pacing Threshold Amplitude: 1 V
Lead Channel Pacing Threshold Amplitude: 1.125 V
Lead Channel Pacing Threshold Pulse Width: 0.5 ms
Lead Channel Pacing Threshold Pulse Width: 0.5 ms
Lead Channel Sensing Intrinsic Amplitude: 2.8 mV
Lead Channel Sensing Intrinsic Amplitude: 3.7 mV
Lead Channel Setting Pacing Amplitude: 1.25 V
Lead Channel Setting Pacing Amplitude: 2.125
Lead Channel Setting Pacing Pulse Width: 0.5 ms
Lead Channel Setting Sensing Sensitivity: 2 mV
Pulse Gen Model: 2272
Pulse Gen Serial Number: 6395904

## 2023-05-11 NOTE — Progress Notes (Signed)
Remote pacemaker transmission.   

## 2023-05-11 NOTE — Addendum Note (Signed)
Addended by: Geralyn Flash D on: 05/11/2023 01:49 PM   Modules accepted: Orders

## 2023-05-17 ENCOUNTER — Ambulatory Visit: Payer: BLUE CROSS/BLUE SHIELD | Admitting: Pulmonary Disease

## 2023-06-04 ENCOUNTER — Ambulatory Visit (INDEPENDENT_AMBULATORY_CARE_PROVIDER_SITE_OTHER): Payer: Medicare Other | Admitting: Pulmonary Disease

## 2023-06-04 ENCOUNTER — Encounter: Payer: Self-pay | Admitting: Pulmonary Disease

## 2023-06-04 VITALS — BP 128/78 | HR 61 | Temp 97.1°F | Ht 62.0 in | Wt 124.4 lb

## 2023-06-04 DIAGNOSIS — J449 Chronic obstructive pulmonary disease, unspecified: Secondary | ICD-10-CM | POA: Diagnosis not present

## 2023-06-04 DIAGNOSIS — Z87891 Personal history of nicotine dependence: Secondary | ICD-10-CM

## 2023-06-04 DIAGNOSIS — I4430 Unspecified atrioventricular block: Secondary | ICD-10-CM

## 2023-06-04 DIAGNOSIS — J439 Emphysema, unspecified: Secondary | ICD-10-CM | POA: Diagnosis not present

## 2023-06-04 DIAGNOSIS — G4736 Sleep related hypoventilation in conditions classified elsewhere: Secondary | ICD-10-CM

## 2023-06-04 MED ORDER — TRELEGY ELLIPTA 100-62.5-25 MCG/ACT IN AEPB: 1.0000 | INHALATION_SPRAY | Freq: Every day | RESPIRATORY_TRACT | Status: AC

## 2023-06-04 NOTE — Progress Notes (Signed)
 Subjective:    Patient ID: Sheila Peters, female    DOB: Nov 13, 1940, 83 y.o.   MRN: [id number]  Patient Care Team: Antonio Baumgarten, MD as PCP - General (Internal Medicine) Wenona Hamilton, MD as PCP - Cardiology (Cardiology) Boyce Byes, MD as PCP - Electrophysiology (Cardiology) Antonio Baumgarten, MD (Internal Medicine) Marc Senior, MD as Consulting Physician (Pulmonary Disease)  Chief Complaint  Patient presents with   Follow-up    DOE. No wheezing. No cough.     BACKGROUND/INTERVAL: Sheila Peters is a 83 year old former smoker (quit 2015) who previously followed here with Dr. Nestora Baptise.  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  She is maintained on Trelegy.  She has a pacemaker for prior complete heart block.  She was last seen here on 16 May 2022.  HPI Discussed the use of AI scribe software for clinical note transcription with the patient, who gave verbal consent to proceed.  History of Present Illness   COURTNY Peters is an 83 year old female with severe COPD who presents for follow-up.  She has severe COPD and last year switched back to [facility] due to insurance coverage issues with Trelegy, which led to significant shortness of breath, making it difficult for her to walk across the room. In October, she resumed Trelegy, resulting in marked improvement in her symptoms.  She has never used oxygen  at night, only using it when she feels short of breath. She wakes up every couple of hours at night to use the bathroom.  She notes the high cost of Trelegy, mentioning that it was $598, but expects it to reduce to $85 after reaching a $2000 threshold. She finds a significant difference in her life with Trelegy, as it helps her manage her advanced COPD.  If the improvement is by preventing air trapping and allowing better lung function.      DATA 01/14/2016 PFTs: FEV1 0.97 L or 49% predicted, FVC 1.99 L or 75% predicted, FEV1 FVC 49% consistent with COPD  stage II-III. 01/13/2021 LDCT:area of probable evolving post infectious or inflammatory scarring in the right upper lobe with adjacent pleural based nodule in the apex of the right upper lobe which is only slightly different than the prior study. This is favored to be benign, but categorized as Lung-RADS 4AS, suspicious. Follow up low-dose chest CT without contrast in 3 months  05/20/2021 LDCT:The nodular area of concern in the apex of the right hemithorax is similar to the prior study, compatible with a benign area of post infectious or inflammatory scarring. No new suspicious findings are noted on the examination. 05/02/2022 CT chest without contrast: Stable right apical scarring, small pulmonary nodules that are stable and considered benign given stability, no new nodules identified.  No acute chest findings.  Review of Systems A 10 point review of systems was performed and it is as noted above otherwise negative.   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   COPD (chronic obstructive pulmonary disease) (HCC)    GERD (gastroesophageal reflux disease)    Hypothyroidism    Ulcerative colitis, chronic (HCC)    HTN (hypertension)    Fall at home, initial encounter    Leukocytosis    Swelling of limb 10/10/2016   Pain in limb 10/10/2016   Varicose veins of leg with pain, left 10/10/2016   Abnormal CT  scan of lung 10/29/2015   Personal history of tobacco use, presenting hazards to health 11/04/2014   Preop cardiovascular exam 09/11/2011   SOB (shortness of breath) 08/21/2011   Chest pain 08/21/2011   PVC's (premature ventricular contractions) 08/21/2011    Social History   Tobacco Use   Smoking status: Former    Current packs/day: 0.00    Average packs/day: 0.5 packs/day for 50.0 years (25.0 ttl pk-yrs)    Types: Cigarettes    Start date:  03/05/1964    Quit date: 03/05/2014    Years since quitting: 9.2   Smokeless tobacco: Never  Substance Use Topics   Alcohol use: Not Currently    Comment: occassionally. none last 6months    Allergies  Allergen Reactions   Adhesive [Tape]     Tears skin   Augmentin [Amoxicillin-Pot Clavulanate] Nausea Only    Dizziness   Codeine Nausea Only    "dizzy"   Contrast Media [Iodinated Contrast Media] Hives   Ibuprofen Hives and Swelling   Iodine Other (See Comments)   Levaquin [Levofloxacin In D5w] Nausea Only   Morphine And Codeine Nausea Only    dizziness    Current Meds  Medication Sig   acetaminophen  (TYLENOL ) 500 MG tablet Take 500 mg by mouth every 6 (six) hours as needed.   albuterol  (VENTOLIN  HFA) 108 (90 Base) MCG/ACT inhaler Inhale 2 puffs into the lungs every 6 (six) hours as needed for wheezing or shortness of breath.   amLODipine  (NORVASC ) 2.5 MG tablet Take 2.5 mg by mouth daily.   Calcium  Citrate-Vitamin D  (CITRACAL + D PO) Take by mouth. Taking 1 daily   Cholecalciferol  (D3-1000 PO) Take by mouth. Taking 1 daily   Cyanocobalamin  (VITAMIN B 12 PO) Take by mouth. Taking 1 daily   Fluticasone -Umeclidin-Vilant (TRELEGY ELLIPTA ) 200-62.5-25 MCG/ACT AEPB Inhale 1 puff into the lungs daily.   latanoprost  (XALATAN ) 0.005 % ophthalmic solution Place 1 drop into both eyes at bedtime.    levothyroxine  (SYNTHROID ) 50 MCG tablet Take 50 mcg by mouth daily.   pantoprazole  (PROTONIX ) 40 MG tablet Take 1 tablet (40 mg total) by mouth daily.   psyllium (METAMUCIL) 58.6 % powder Take 1 packet by mouth 3 (three) times daily.   rosuvastatin (CRESTOR) 5 MG tablet Take 5 mg by mouth daily.    Immunization History  Administered Date(s) Administered   Influenza Inj Mdck Quad Pf 01/17/2018, 01/21/2019, 02/08/2021, 02/10/2022   Influenza, Mdck, Trivalent,PF 6+ MOS(egg free) 02/16/2023   Influenza,inj,Quad PF,6+ Mos 01/18/2016, 02/03/2020   Influenza-Unspecified 01/19/2012, 02/14/2013,  01/02/2014, 01/17/2016   Moderna Sars-Covid-2 Vaccination 06/11/2019, 07/09/2019, 03/15/2020   PPD Test 03/26/2017   Pneumococcal Conjugate-13 11/28/2016   Pneumococcal Polysaccharide-23 04/11/2011        Objective:     BP 128/78 (BP Location: Right Arm, Cuff Size: Normal)   Pulse 61   Temp (!) 97.1 F (36.2 C)   Ht 5\' 2"  (1.575 m)   Wt 124 lb 6.4 oz (56.4 kg)   SpO2 96%   BMI 22.75 kg/m   SpO2: 96 % O2 Device: None (Room air)  GENERAL: Awake, alert, no acute distress.  Mild chronic use of accessories.  Very well-groomed.  Fully ambulatory. HEAD: Normocephalic, atraumatic.  EYES: Pupils equal, round, reactive to light.  No scleral icterus.  MOUTH: Masking requirements. NECK: Supple. No thyromegaly. Trachea midline. No JVD.  No adenopathy. PULMONARY: Increased AP diameter. Distant breath sounds bilaterally, no adventitious sounds. CARDIOVASCULAR: S1 and S2. Regular rate and rhythm.  GASTROINTESTINAL: No  abdominal distention. MUSCULOSKELETAL: No joint deformity, no clubbing, no edema.  NEUROLOGIC: No focal deficits noted, gait steady, speech fluent. SKIN: Intact,warm,dry.  Limited exam, no rashes.   PSYCH: Mood and behavior normal.    Assessment & Plan:     ICD-10-CM   1. Stage 3 severe COPD by GOLD classification (HCC)  J44.9     2. Nocturnal hypoxemia due to emphysema (HCC)  J43.9    G47.36     3. AVB (atrioventricular block)  I44.30     4. Former smoker  Z87.891      Orders Placed This Encounter  Procedures   Overnight Pulse Oximetry Study    Room air DME: [employer]    Standing Status:   Future    Expiration Date:   06/03/2024   Meds ordered this encounter  Medications   Fluticasone -Umeclidin-Vilant (TRELEGY ELLIPTA ) 100-62.5-25 MCG/ACT AEPB    Sig: Inhale 1 puff into the lungs daily.    Lot Number?:   [id number]    Expiration Date?:   08/22/2024    Quantity:   1   Discussion:    Chronic Obstructive Pulmonary Disease (COPD) Severe COPD with significant  improvement on Trelegy compared to Breo. She reports difficulty walking across the room without shortness of breath on Breo. Switched back to Trelegy in October and noted substantial improvement. Insurance issues previously prevented access to Trelegy. Discussed the benefits of Trelegy, including better air trapping management, which allows for improved lung function. Explained the cost implications and insurance coverage issues. - Provide Trelegy sample (14 days) - Check nighttime oxygen  levels with [employer] - Follow-up in six months unless issues arise  Nocturnal Hypoxemia Potential nocturnal hypoxemia due to COPD. She has not used oxygen  at night and only uses it when short of breath. Concern about the impact on heart health, especially given the presence of a pacemaker. Discussed the importance of nighttime oxygen  therapy for heart health and overall well-being. Explained the overnight oximetry test process and its significance. - Order overnight oximetry test with [employer] - Review results and discuss potential need for nighttime oxygen  therapy  Follow-up - Schedule follow-up appointment in six months.      Advised if symptoms do not improve or worsen, to please contact office for sooner follow up or seek emergency care.    I spent 35 minutes of dedicated to the care of this patient on the date of this encounter to include pre-visit review of records, face-to-face time with the patient discussing conditions above, post visit ordering of testing, clinical documentation with the electronic health record, making appropriate referrals as documented, and communicating necessary findings to members of the patients care team.     C. Chloe Counter, MD Advanced Bronchoscopy PCCM [facility]    *This note was generated using voice recognition software/Dragon and/or AI transcription program.  Despite best efforts to proofread, errors can occur which can change the meaning. Any  transcriptional errors that result from this process are unintentional and may not be fully corrected at the time of dictation.

## 2023-06-04 NOTE — Patient Instructions (Signed)
 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 We also talked about the potential need for nighttime oxygen  therapy due to concerns about nocturnal hypoxemia.  YOUR PLAN:  -CHRONIC OBSTRUCTIVE PULMONARY DISEASE (COPD): COPD is a chronic lung condition that makes it hard to breathe. You have severe COPD, but switching back to Trelegy has significantly improved your symptoms. We discussed the benefits of Trelegy, including better management of air trapping, which helps your lung function. You will receive a 14-day sample of Trelegy, and we will check your nighttime oxygen  levels with [employer]. Please follow up in six months unless you have any issues.  -NOCTURNAL HYPOXEMIA: Nocturnal hypoxemia is a condition where your oxygen  levels drop at night. This can affect your heart health, especially since you have a pacemaker. We discussed the importance of nighttime oxygen  therapy for your overall well-being. An overnight oximetry test with [facility] has been ordered to check your nighttime oxygen  levels, and we will review the results to see if you need nighttime oxygen  therapy.  INSTRUCTIONS:  Please schedule a follow-up appointment in six months. Additionally, complete the overnight oximetry test with [employer] as ordered, and we will review the results to determine if nighttime oxygen  therapy is needed.

## 2023-07-03 ENCOUNTER — Ambulatory Visit: Payer: Medicare Other

## 2023-07-03 DIAGNOSIS — I442 Atrioventricular block, complete: Secondary | ICD-10-CM | POA: Diagnosis not present

## 2023-07-03 LAB — CUP PACEART REMOTE DEVICE CHECK
Battery Remaining Longevity: 95 mo
Battery Remaining Percentage: 77 %
Battery Voltage: 3.01 V
Brady Statistic AP VP Percent: 3.7 %
Brady Statistic AP VS Percent: 38 %
Brady Statistic AS VP Percent: 3.9 %
Brady Statistic AS VS Percent: 54 %
Brady Statistic RA Percent Paced: 41 %
Brady Statistic RV Percent Paced: 7.7 %
Date Time Interrogation Session: 20250311020020
Implantable Lead Connection Status: 753985
Implantable Lead Connection Status: 753985
Implantable Lead Implant Date: 20220314
Implantable Lead Implant Date: 20220314
Implantable Lead Location: 753859
Implantable Lead Location: 753860
Implantable Pulse Generator Implant Date: 20220314
Lead Channel Impedance Value: 460 Ohm
Lead Channel Impedance Value: 490 Ohm
Lead Channel Pacing Threshold Amplitude: 0.875 V
Lead Channel Pacing Threshold Amplitude: 1 V
Lead Channel Pacing Threshold Pulse Width: 0.5 ms
Lead Channel Pacing Threshold Pulse Width: 0.5 ms
Lead Channel Sensing Intrinsic Amplitude: 2.5 mV
Lead Channel Sensing Intrinsic Amplitude: 3.2 mV
Lead Channel Setting Pacing Amplitude: 1.125
Lead Channel Setting Pacing Amplitude: 2 V
Lead Channel Setting Pacing Pulse Width: 0.5 ms
Lead Channel Setting Sensing Sensitivity: 2 mV
Pulse Gen Model: 2272
Pulse Gen Serial Number: 6395904

## 2023-07-05 ENCOUNTER — Encounter: Payer: Self-pay | Admitting: Cardiology

## 2023-07-18 ENCOUNTER — Telehealth: Payer: Self-pay

## 2023-07-18 NOTE — Telephone Encounter (Signed)
 ONO results have been scanned in.   ONO reviewed by Dr. Jayme Cloud- Low SpO2 82%. Continues to require O2 nocturnally.  I have notified the patient.  Nothing further needed.

## 2023-07-18 NOTE — Telephone Encounter (Signed)
 Copied from CRM [id number]. Topic: Clinical - Lab/Test Results >> Jul 18, 2023 11:46 AM Nila Nephew wrote: Reason for CRM: Patient is calling in to state that she had an overnight test done 02/16 to 06/17 through [facility] - ordered by Dr. Jayme Cloud per patient. Record of order not in system or available on agent end. Patient was inquiring about getting the results for this, as well as if provider has been informed about results of test. Please update patient with information if on hand.  She states she got a bill for it due 03/30 but has not had results yet.   Patient states test was dropped off as a device, picked up, and was listed as being processed as [employer].

## 2023-08-14 NOTE — Progress Notes (Unsigned)
  Electrophysiology Office Follow up Visit Note:    Date:  08/15/2023   ID:  Sheila, Peters [birth year], MRN [id number]  PCP:  Antonio Baumgarten, MD  [facility] Cardiologist:  Antionette Kirks, MD  [facility] Electrophysiologist:  Boyce Byes, MD    Interval History:     Sheila Peters is a 83 y.o. female who presents for a follow up visit.   I last saw the patient April 19, 2022.  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  She has atrial fibrillation and a history of stroke.  At the last appointment with me we started anticoagulation with Eliquis .  She last saw Cadence in clinic January 26, 2023.  At that appointment the patient reported stopping Eliquis  for unclear reasons.  Today he confirms that she stopped taking Eliquis .  She is not able to tell me why.  She tells me that it is not because of cost or concerns about bleeding.  She does not take aspirin  daily and just uses it occasionally.       Past medical, surgical, social and family history were reviewed.  ROS:   Please see the history of present illness.    All other systems reviewed and are negative.  EKGs/Labs/Other Studies Reviewed:    The following studies were reviewed today:  August 15, 2023 in-clinic device interrogation personally reviewed Battery and lead parameter stable No programming changes made today A-fib burden less than 1%          Physical Exam:    VS:  BP (!) 152/72   Pulse 63   Ht 5\' 2"  (1.575 m)   Wt 124 lb 3.2 oz (56.3 kg)   SpO2 97%   BMI 22.72 kg/m     Wt Readings from Last 3 Encounters:  08/15/23 124 lb 3.2 oz (56.3 kg)  06/04/23 124 lb 6.4 oz (56.4 kg)  01/26/23 124 lb 12.8 oz (56.6 kg)     GEN: no distress.  Elderly CARD: RRR, No MRG.  CIED pocket well-healed RESP: No IWOB. CTAB.      ASSESSMENT:    1. Complete heart block (HCC)   2. Cardiac pacemaker in situ   3. Paroxysmal atrial fibrillation  (HCC)   4. Primary hypertension    PLAN:    In order of problems listed above:  #Complete heart block #Permanent pacemaker in situ Device functioning appropriately.  Continue remote monitoring.  #Paroxysmal atrial fibrillation Low burden Previously on Eliquis  but the patient self discontinued for unclear reasons.  We discussed the risk of stroke given her elevated CHA2DS2-VASc with the patient is very clear that she will not consider anticoagulation.    #Hypertension Above goal today.  Recommend checking blood pressures 1-2 times per week at home and recording the values.  Recommend bringing these recordings to the primary care physician. Continue amlodipine .  I recommended that she increase her amlodipine  to 5 mg by mouth once daily but she said that she would prefer her primary care physician to make all medication changes.  Follow-up 1 year with EP APP.  Signed, Harvie Liner, MD, [facility], [facility] 08/15/2023 11:37 AM    Electrophysiology [facility]

## 2023-08-15 ENCOUNTER — Ambulatory Visit: Attending: Cardiology | Admitting: Cardiology

## 2023-08-15 ENCOUNTER — Encounter: Payer: Self-pay | Admitting: Cardiology

## 2023-08-15 VITALS — BP 152/72 | HR 63 | Ht 62.0 in | Wt 124.2 lb

## 2023-08-15 DIAGNOSIS — I442 Atrioventricular block, complete: Secondary | ICD-10-CM | POA: Insufficient documentation

## 2023-08-15 DIAGNOSIS — I10 Essential (primary) hypertension: Secondary | ICD-10-CM | POA: Diagnosis present

## 2023-08-15 DIAGNOSIS — Z950 Presence of cardiac pacemaker: Secondary | ICD-10-CM | POA: Insufficient documentation

## 2023-08-15 DIAGNOSIS — I480 Paroxysmal atrial fibrillation: Secondary | ICD-10-CM | POA: Diagnosis present

## 2023-08-15 NOTE — Patient Instructions (Signed)

## 2023-08-20 NOTE — Progress Notes (Signed)
 Remote pacemaker transmission.

## 2023-10-02 ENCOUNTER — Ambulatory Visit (INDEPENDENT_AMBULATORY_CARE_PROVIDER_SITE_OTHER): Payer: Medicare Other

## 2023-10-02 DIAGNOSIS — I442 Atrioventricular block, complete: Secondary | ICD-10-CM

## 2023-10-03 LAB — CUP PACEART REMOTE DEVICE CHECK
Battery Remaining Longevity: 92 mo
Battery Remaining Percentage: 75 %
Battery Voltage: 3.01 V
Brady Statistic AP VP Percent: 1.9 %
Brady Statistic AP VS Percent: 46 %
Brady Statistic AS VP Percent: 13 %
Brady Statistic AS VS Percent: 39 %
Brady Statistic RA Percent Paced: 48 %
Brady Statistic RV Percent Paced: 15 %
Date Time Interrogation Session: 20250610023345
Implantable Lead Connection Status: 753985
Implantable Lead Connection Status: 753985
Implantable Lead Implant Date: 20220314
Implantable Lead Implant Date: 20220314
Implantable Lead Location: 753859
Implantable Lead Location: 753860
Implantable Pulse Generator Implant Date: 20220314
Lead Channel Impedance Value: 490 Ohm
Lead Channel Impedance Value: 530 Ohm
Lead Channel Pacing Threshold Amplitude: 1 V
Lead Channel Pacing Threshold Amplitude: 1 V
Lead Channel Pacing Threshold Pulse Width: 0.5 ms
Lead Channel Pacing Threshold Pulse Width: 0.5 ms
Lead Channel Sensing Intrinsic Amplitude: 2.5 mV
Lead Channel Sensing Intrinsic Amplitude: 2.6 mV
Lead Channel Setting Pacing Amplitude: 1.25 V
Lead Channel Setting Pacing Amplitude: 2 V
Lead Channel Setting Pacing Pulse Width: 0.5 ms
Lead Channel Setting Sensing Sensitivity: 0.5 mV
Pulse Gen Model: 2272
Pulse Gen Serial Number: 6395904

## 2023-10-06 ENCOUNTER — Ambulatory Visit: Payer: Self-pay | Admitting: Cardiology

## 2023-12-04 NOTE — Addendum Note (Signed)
 Addended by: Kamela Blansett A on: 12/04/2023 10:00 AM   Modules accepted: Orders

## 2023-12-04 NOTE — Progress Notes (Signed)
 Remote pacemaker transmission.

## 2024-01-01 ENCOUNTER — Ambulatory Visit: Payer: Medicare Other

## 2024-01-01 DIAGNOSIS — I442 Atrioventricular block, complete: Secondary | ICD-10-CM

## 2024-01-02 LAB — CUP PACEART REMOTE DEVICE CHECK
Battery Remaining Longevity: 88 mo
Battery Remaining Percentage: 72 %
Battery Voltage: 3.01 V
Brady Statistic AP VP Percent: 20 %
Brady Statistic AP VS Percent: 24 %
Brady Statistic AS VP Percent: 39 %
Brady Statistic AS VS Percent: 17 %
Brady Statistic RA Percent Paced: 44 %
Brady Statistic RV Percent Paced: 59 %
Date Time Interrogation Session: 20250909031305
Implantable Lead Connection Status: 753985
Implantable Lead Connection Status: 753985
Implantable Lead Implant Date: 20220314
Implantable Lead Implant Date: 20220314
Implantable Lead Location: 753859
Implantable Lead Location: 753860
Implantable Pulse Generator Implant Date: 20220314
Lead Channel Impedance Value: 480 Ohm
Lead Channel Impedance Value: 480 Ohm
Lead Channel Pacing Threshold Amplitude: 0.625 V
Lead Channel Pacing Threshold Amplitude: 1.125 V
Lead Channel Pacing Threshold Pulse Width: 0.5 ms
Lead Channel Pacing Threshold Pulse Width: 0.5 ms
Lead Channel Sensing Intrinsic Amplitude: 3.3 mV
Lead Channel Sensing Intrinsic Amplitude: 3.4 mV
Lead Channel Setting Pacing Amplitude: 1.375
Lead Channel Setting Pacing Amplitude: 1.625
Lead Channel Setting Pacing Pulse Width: 0.5 ms
Lead Channel Setting Sensing Sensitivity: 0.5 mV
Pulse Gen Model: 2272
Pulse Gen Serial Number: 6395904

## 2024-01-04 ENCOUNTER — Ambulatory Visit: Payer: Self-pay | Admitting: Cardiology

## 2024-01-10 NOTE — Progress Notes (Signed)
 Remote PPM Transmission

## 2024-02-12 ENCOUNTER — Encounter: Payer: Self-pay | Admitting: Medical

## 2024-02-12 ENCOUNTER — Ambulatory Visit: Attending: Medical | Admitting: Medical

## 2024-02-12 VITALS — BP 114/60 | HR 72 | Ht 62.0 in | Wt 125.6 lb

## 2024-02-12 DIAGNOSIS — E782 Mixed hyperlipidemia: Secondary | ICD-10-CM | POA: Insufficient documentation

## 2024-02-12 DIAGNOSIS — I10 Essential (primary) hypertension: Secondary | ICD-10-CM | POA: Diagnosis not present

## 2024-02-12 DIAGNOSIS — I442 Atrioventricular block, complete: Secondary | ICD-10-CM | POA: Diagnosis not present

## 2024-02-12 DIAGNOSIS — I480 Paroxysmal atrial fibrillation: Secondary | ICD-10-CM | POA: Diagnosis not present

## 2024-02-12 NOTE — Patient Instructions (Signed)
 Medication Instructions:  Your physician recommends that you continue on your current medications as directed. Please refer to the Current Medication list given to you today.    *If you need a refill on your cardiac medications before your next appointment, please call your pharmacy*  Lab Work: No labs ordered today    Testing/Procedures: No test ordered today   Follow-Up: At [facility], you and your health needs are our priority.  As part of our continuing mission to provide you with exceptional heart care, our providers are all part of one team.  This team includes your primary Cardiologist (physician) and Advanced Practice Providers or APPs (Physician Assistants and Nurse Practitioners) who all work together to provide you with the care you need, when you need it.  Your next appointment:   1 year(s)  Provider:   Deatrice Cage, MD or Cadence Franchester, PA-C

## 2024-02-12 NOTE — Progress Notes (Signed)
 Cardiology Office Note   Date:  02/12/2024  ID:  Dolce, Sylvia 1941/03/22, MRN [id number] PCP: Sadie Manna, MD  [facility] Providers Cardiologist:  Deatrice Cage, MD Electrophysiologist:  OLE ONEIDA HOLTS, MD   History of Present Illness Sheila Peters is a 83 y.o. female with a hx of HTN, CHB s/p PPM, CVA, PAF (noted on device on interrogation, Eliquis ), aortic atherosclerosis on CT, PVCs, COPD on home O2, former smoker (Cessation in 2015), GERD, ulcerative colitis, hypothyroidism who presents for follow-up.   Patient initially establish care with Dr. Cage for exertional dyspnea, PVCs, and atypical chest pain.  Lexiscan  Myoview  that showed no evidence of ischemia.  Echo showed normal LV function with no significant valvular abnormalities.   She was lost to follow-up but came back in 2021 to reestablish care for exertional dyspnea.  CT in 2020 showed evidence of aortic and coronary atherosclerosis.  Repeat Lexiscan  Myoview  was normal.  Echo showed EF of 60 to 65%, grade 1 diastolic dysfunction, mildly elevated PA systolic pressure.   On 06/2020 she was admitted with syncope and a high-grade heart block. She underwent a left and right heart cath which showed nonobstructive coronary disease. Echo showed LVEF 55-60%, G1DD.  Temporary pacing wires were placed and she was transferred to [facility] for further evaluation by EP and placement of PPM. On 07/05/20 she underwent daul-chamber PPM.    In September 2022 she presented to neurology office with strokelike symptoms.  Her head CT was negative for acute intracranial process.  An MRI was ordered and initially she agreed to proceed however she eventually declined.   On December 2023 she was evaluated by Dr. HOLTS.  Recent interrogations of her pacemaker showed a low burden of A-fib however frequency was increasing.  Due to CHA2DS2-VASc of 6 she was started on low dose Eliquis .   The patient was last seen 08/15/23 by EP. Device  was functioning normally. Patient self discontinued Eliquis  due to not wanting to take it.  Today, the patient is overall doing well. The patient denies chest pain. She has chronic SOB that is unchanged. She has occasional lower leg edema. She has occasional lightheadedness and dizziness, she feels this has to do with her vision. This occurs when she is walking. No syncope. No recent falls.   Studies Reviewed EKG Interpretation Date/Time:  Tuesday February 12 2024 14:17:58 EDT Ventricular Rate:  72 PR Interval:  272 QRS Duration:  166 QT Interval:  446 QTC Calculation: 488 R Axis:   -64  Text Interpretation: Atrial-sensed ventricular-paced rhythm with prolonged AV conduction When compared with ECG of 26-Jan-2023 11:21, Electronic ventricular pacemaker has replaced Sinus rhythm Confirmed by Franchester, Dyanne Yorks ([id number]) on 02/12/2024 2:26:20 PM    07/05/20 echo complete - EF 55-60%, no RWMA, grade I DD.   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  Otherwise, no significant coronary artery disease. Normal left ventricular contraction and filling pressure. Successful placement of 60F temporary transvenous pacing wire via the right internal jugular vein.   Recommendations: STAT PCXR when patient arrives in ICU. Maintain temporary pacemaker (rate 40 bpm, output 3 mA). Likely transfer to [facility] tomorrow for EP evaluation and possible permanent pacemaker placement. Medical therapy and risk factor modification to prevent progression of mild CAD.   10/03/2019 Myocardial perfusion -  Pharmacological myocardial perfusion imaging study with no significant  ischemia Normal wall motion, EF estimated at 82% No EKG changes concerning for ischemia at  peak stress or in recovery. CT attenuation correction images with mild aortic atherosclerosis, mild coronary calcification Low risk scan      Physical Exam VS:  BP 114/60 (BP Location: Left Arm,  Patient Position: Sitting, Cuff Size: Normal)   Pulse 72 Comment: 64 oximeter  Ht 5' 2 (1.575 m)   Wt 125 lb 9.6 oz (57 kg)   SpO2 95%   BMI 22.97 kg/m        Wt Readings from Last 3 Encounters:  02/12/24 125 lb 9.6 oz (57 kg)  08/15/23 124 lb 3.2 oz (56.3 kg)  06/04/23 124 lb 6.4 oz (56.4 kg)    GEN: Well nourished, well developed in no acute distress NECK: No JVD; No carotid bruits CARDIAC: RRR, no murmurs, rubs, gallops RESPIRATORY:  Clear to auscultation without rales, wheezing or rhonchi  ABDOMEN: Soft, non-tender, non-distended EXTREMITIES:  No edema; No deformity   ASSESSMENT AND PLAN  CHB s/p PPM Patient is followed by EP.  Most recent device shows normally functioning device.  Paroxysmal A-fib Patient self discontinued Eliquis  due to not wanting to take it.  Most recent device checks have not shown A-fib.  Patient follows with the EP.  Hypertension Blood pressure is good today, continue amlodipine  2.5 mg daily  Hyperlipidemia LDL 84.  Continue Crestor 5 mg daily.    Dispo: Follow-up in 1 year  Signed, Markeith Jue VEAR Fishman, PA-C

## 2024-04-01 ENCOUNTER — Ambulatory Visit: Payer: Medicare Other

## 2024-04-02 LAB — CUP PACEART REMOTE DEVICE CHECK
Battery Remaining Longevity: 82 mo
Battery Remaining Percentage: 70 %
Battery Voltage: 2.99 V
Brady Statistic AP VP Percent: 28 %
Brady Statistic AP VS Percent: 15 %
Brady Statistic AS VP Percent: 47 %
Brady Statistic AS VS Percent: 10 %
Brady Statistic RA Percent Paced: 43 %
Brady Statistic RV Percent Paced: 74 %
Date Time Interrogation Session: 20251209045227
Implantable Lead Connection Status: 753985
Implantable Lead Connection Status: 753985
Implantable Lead Implant Date: 20220314
Implantable Lead Implant Date: 20220314
Implantable Lead Location: 753859
Implantable Lead Location: 753860
Implantable Pulse Generator Implant Date: 20220314
Lead Channel Impedance Value: 460 Ohm
Lead Channel Impedance Value: 460 Ohm
Lead Channel Pacing Threshold Amplitude: 0.875 V
Lead Channel Pacing Threshold Amplitude: 1 V
Lead Channel Pacing Threshold Pulse Width: 0.5 ms
Lead Channel Pacing Threshold Pulse Width: 0.5 ms
Lead Channel Sensing Intrinsic Amplitude: 3.2 mV
Lead Channel Sensing Intrinsic Amplitude: 7.3 mV
Lead Channel Setting Pacing Amplitude: 1.25 V
Lead Channel Setting Pacing Amplitude: 1.875
Lead Channel Setting Pacing Pulse Width: 0.5 ms
Lead Channel Setting Sensing Sensitivity: 0.5 mV
Pulse Gen Model: 2272
Pulse Gen Serial Number: 6395904

## 2024-04-08 NOTE — Progress Notes (Signed)
 Remote PPM Transmission

## 2024-04-11 ENCOUNTER — Ambulatory Visit: Payer: Self-pay | Admitting: Cardiology
